# Patient Record
Sex: Female | Born: 1995 | Race: Black or African American | Hispanic: No | Marital: Married | Smoking: Never smoker
Health system: Southern US, Community
[De-identification: ages and names within clinical notes are randomized; demographics above are authoritative.]

## PROBLEM LIST (undated history)

## (undated) DIAGNOSIS — L0291 Cutaneous abscess, unspecified: Secondary | ICD-10-CM

---

## 2015-04-28 ENCOUNTER — Emergency Department (HOSPITAL_BASED_OUTPATIENT_CLINIC_OR_DEPARTMENT_OTHER)
Admission: EM | Admit: 2015-04-28 | Discharge: 2015-04-28 | Discharge status: Against Medical Advice | Payer: Self-pay | Attending: Emergency Medicine | Admitting: Emergency Medicine

## 2015-04-28 ENCOUNTER — Encounter (HOSPITAL_BASED_OUTPATIENT_CLINIC_OR_DEPARTMENT_OTHER): Payer: Self-pay | Admitting: *Deleted

## 2015-04-28 DIAGNOSIS — K088 Other specified disorders of teeth and supporting structures: Secondary | ICD-10-CM | POA: Insufficient documentation

## 2015-04-28 NOTE — ED Notes (Signed)
Pt c/o dental pain x 3 days.

## 2015-04-28 NOTE — ED Notes (Addendum)
Pt appeared to be seeing pt but was not.  Was near hallbed but not seeing pt.

## 2015-04-28 NOTE — ED Notes (Signed)
Pt seen walking down hallway leaving the Ed.  Pts boyfriend stated that "its too long.  We're going to [employer]."

## 2015-04-28 NOTE — ED Notes (Signed)
Patient family stopped this EMT in the hall asking about when they would see the doctor. Notified charge RN and float RN Rod Holler for patient to get an update.

## 2016-09-23 ENCOUNTER — Encounter (HOSPITAL_BASED_OUTPATIENT_CLINIC_OR_DEPARTMENT_OTHER): Payer: Self-pay | Admitting: *Deleted

## 2016-09-23 ENCOUNTER — Emergency Department (HOSPITAL_BASED_OUTPATIENT_CLINIC_OR_DEPARTMENT_OTHER)
Admission: EM | Admit: 2016-09-23 | Discharge: 2016-09-23 | Disposition: A | Discharge status: Home / Self Care | Payer: Medicaid Other | Attending: Emergency Medicine | Admitting: Emergency Medicine

## 2016-09-23 DIAGNOSIS — R42 Dizziness and giddiness: Secondary | ICD-10-CM | POA: Insufficient documentation

## 2016-09-23 LAB — PREGNANCY, URINE: PREG TEST UR: NEGATIVE

## 2016-09-23 LAB — CBG MONITORING, ED: Glucose-Capillary: 111 mg/dL — ABNORMAL HIGH (ref 65–99)

## 2016-09-23 MED ORDER — MECLIZINE HCL 25 MG PO TABS
25.0000 mg | ORAL_TABLET | Freq: Once | ORAL | Status: AC
  Administered 2016-09-23: 25 mg via ORAL
  Filled 2016-09-23: qty 1

## 2016-09-23 MED ORDER — MECLIZINE HCL 25 MG PO TABS: 25.0000 mg | ORAL_TABLET | Freq: Three times a day (TID) | ORAL | 0 refills | Status: DC | PRN

## 2016-09-23 MED ORDER — DEXAMETHASONE 4 MG PO TABS
10.0000 mg | ORAL_TABLET | Freq: Once | ORAL | Status: AC
  Administered 2016-09-23: 10 mg via ORAL
  Filled 2016-09-23: qty 1

## 2016-09-23 NOTE — Discharge Instructions (Signed)
Take the medication provided.  Follow up with your PCP for further eval.  Discuss with your OB about taking out your implanon.

## 2016-09-23 NOTE — ED Notes (Signed)
Reports dizziness and nausea x 4 days.  Reports unknown why.  Denies vision changes, reports headache.  Reports it could be implanon birth control.

## 2016-09-23 NOTE — ED Triage Notes (Signed)
Pt reports 4-7 days of intermittent feelings of dizzyness, "like the room is spinning around." denies any other c/o, amb with quick steady gait smiling in nad

## 2016-09-23 NOTE — ED Provider Notes (Signed)
[facility] DEPT [facility] Provider Note   CSN: [id number] Arrival date & time: 09/23/16  1422     History   Chief Complaint Chief Complaint  Patient presents with  . Dizziness    HPI Monica [name] is a 20 y.o. female.  20 yo F with a cc of light-headedness.  This been going on for about a week. Patient denies any alleviating or worsening factors. Tasted feels like the room is spinning around her. Denies hold symptoms. Denies tinnitus. Denies fevers or chills. Denies increased vaginal bleeding diarrhea or vomiting. Patient is really worried that her Implanon has been bothering her and would like it removed immediately. She also would like her iron level checked.   The history is provided by the patient.  Dizziness  Quality:  Head spinning and room spinning Severity:  Moderate Onset quality:  Gradual Duration:  2 days Timing:  Constant Progression:  Worsening Chronicity:  New Relieved by:  Nothing Worsened by:  Nothing Ineffective treatments:  None tried Associated symptoms: no chest pain, no headaches, no nausea, no palpitations, no shortness of breath and no vomiting     History reviewed. No pertinent past medical history.  There are no active problems to display for this patient.   History reviewed. No pertinent surgical history.  OB History    No data available       Home Medications    Prior to Admission medications   Medication Sig Start Date End Date Taking? Authorizing Provider  meclizine (ANTIVERT) 25 MG tablet Take 1 tablet (25 mg total) by mouth 3 (three) times daily as needed. 09/23/16   Deno Etienne, DO    Family History History reviewed. No pertinent family history.  Social History Social History  Substance Use Topics  . Smoking status: Never Smoker  . Smokeless tobacco: Never Used  . Alcohol use No     Allergies   Patient has no known allergies.   Review of Systems Review of Systems  Constitutional: Negative for chills and fever.    HENT: Negative for congestion and rhinorrhea.   Eyes: Negative for redness and visual disturbance.  Respiratory: Negative for shortness of breath and wheezing.   Cardiovascular: Negative for chest pain and palpitations.  Gastrointestinal: Negative for nausea and vomiting.  Genitourinary: Negative for dysuria and urgency.  Musculoskeletal: Negative for arthralgias and myalgias.  Skin: Negative for pallor and wound.  Neurological: Positive for dizziness. Negative for headaches.     Physical Exam Updated Vital Signs BP 113/76 (BP Location: Left Arm)   Pulse 69   Temp 98.6 F (37 C) (Oral)   Resp 18   LMP 09/09/2016   SpO2 99%   Physical Exam  Constitutional: She is oriented to person, place, and time. She appears well-developed and well-nourished. No distress.  HENT:  Head: Normocephalic and atraumatic.  Eyes: EOM are normal. Pupils are equal, round, and reactive to light.  Neck: Normal range of motion. Neck supple.  Cardiovascular: Normal rate and regular rhythm.  Exam reveals no gallop and no friction rub.   No murmur heard. Pulmonary/Chest: Effort normal. She has no wheezes. She has no rales.  Abdominal: Soft. She exhibits no distension and no mass. There is no tenderness. There is no guarding.  Musculoskeletal: She exhibits no edema or tenderness.  Neurological: She is alert and oriented to person, place, and time. She has normal strength. No cranial nerve deficit or sensory deficit. She displays a negative Romberg sign. Coordination and gait normal. GCS eye subscore is  4. GCS verbal subscore is 5. GCS motor subscore is 6. She displays no Babinski's sign on the right side. She displays no Babinski's sign on the left side.  Reflex Scores:      Tricep reflexes are 2+ on the right side and 2+ on the left side.      Bicep reflexes are 2+ on the right side and 2+ on the left side.      Brachioradialis reflexes are 2+ on the right side and 2+ on the left side.      Patellar  reflexes are 2+ on the right side and 2+ on the left side.      Achilles reflexes are 2+ on the right side and 2+ on the left side. Skin: Skin is warm and dry. She is not diaphoretic.  Psychiatric: She has a normal mood and affect. Her behavior is normal.  Nursing note and vitals reviewed.    ED Treatments / Results  Labs (all labs ordered are listed, but only abnormal results are displayed) Labs Reviewed  CBG MONITORING, ED - Abnormal; Notable for the following:       Result Value   Glucose-Capillary 111 (*)    All other components within normal limits  PREGNANCY, URINE    EKG  EKG Interpretation  Date/Time:  Thursday September 23 2016 14:55:55 EST Ventricular Rate:  70 PR Interval:    QRS Duration: 90 QT Interval:  383 QTC Calculation: 414 R Axis:   82 Text Interpretation:  Sinus rhythm Borderline T abnormalities, anterior leads No old tracing to compare Confirmed by Summer Parthasarathy MD, DANIEL [phone]) on 09/23/2016 3:03:43 PM       Radiology No results found.  Procedures Procedures (including critical care time)  Medications Ordered in ED Medications  meclizine (ANTIVERT) tablet 25 mg (25 mg Oral Given 09/23/16 1501)  dexamethasone (DECADRON) tablet 10 mg (10 mg Oral Given 09/23/16 1501)     Initial Impression / Assessment and Plan / ED Course  I have reviewed the triage vital signs and the nursing notes.  Pertinent labs & imaging results that were available during my care of the patient were reviewed by me and considered in my medical decision making (see chart for details).  Clinical Course     20 yo F with a cc of vertigo.  ECG, poc glucose and pregnancy test unremarkable.  PCP follow up.   3:06 PM:  I have discussed the diagnosis/risks/treatment options with the patient and family and believe the pt to be eligible for discharge home to follow-up with PCP. We also discussed returning to the ED immediately if new or worsening sx occur. We discussed the sx which are most  concerning (e.g., sudden worsening pain, fever, inability to tolerate by mouth) that necessitate immediate return. Medications administered to the patient during their visit and any new prescriptions provided to the patient are listed below.  Medications given during this visit Medications  meclizine (ANTIVERT) tablet 25 mg (25 mg Oral Given 09/23/16 1501)  dexamethasone (DECADRON) tablet 10 mg (10 mg Oral Given 09/23/16 1501)     The patient appears reasonably screen and/or stabilized for discharge and I doubt any other medical condition or other [facility] requiring further screening, evaluation, or treatment in the ED at this time prior to discharge.    Final Clinical Impressions(s) / ED Diagnoses   Final diagnoses:  Dizziness    New Prescriptions New Prescriptions   MECLIZINE (ANTIVERT) 25 MG TABLET    Take 1 tablet (25 mg  total) by mouth 3 (three) times daily as needed.     Deno Etienne, DO 09/23/16 1506

## 2017-04-22 ENCOUNTER — Emergency Department (HOSPITAL_BASED_OUTPATIENT_CLINIC_OR_DEPARTMENT_OTHER)
Admission: EM | Admit: 2017-04-22 | Discharge: 2017-04-22 | Disposition: A | Discharge status: Home / Self Care | Payer: Medicaid Other | Attending: Emergency Medicine | Admitting: Emergency Medicine

## 2017-04-22 ENCOUNTER — Encounter (HOSPITAL_BASED_OUTPATIENT_CLINIC_OR_DEPARTMENT_OTHER): Payer: Self-pay | Admitting: Emergency Medicine

## 2017-04-22 DIAGNOSIS — T450X1A Poisoning by antiallergic and antiemetic drugs, accidental (unintentional), initial encounter: Secondary | ICD-10-CM | POA: Diagnosis not present

## 2017-04-22 DIAGNOSIS — F419 Anxiety disorder, unspecified: Secondary | ICD-10-CM | POA: Diagnosis present

## 2017-04-22 MED ORDER — LORAZEPAM 1 MG PO TABS
0.5000 mg | ORAL_TABLET | Freq: Once | ORAL | Status: AC
  Administered 2017-04-22: 0.5 mg via ORAL
  Filled 2017-04-22: qty 1

## 2017-04-22 NOTE — ED Provider Notes (Signed)
[facility] DEPT [facility] Provider Note   CSN: [id number] Arrival date & time: 04/22/17  1123     History   Chief Complaint Chief Complaint  Patient presents with  . Medication Reaction    HPI Monica [name] is a 21 y.o. female.  21 yo F with a chief complaints of anxiety. Going on for the past couple days. Feels like she is shaking all over. States that she started taking a weight gain supplement Ciproheptidine.  After which started having the onset of the symptoms. She has tried drinking fluids at home and sleeping all day that has not improved it. Was worse after she had a couple coffee.   The history is provided by the patient.  Illness  This is a new problem. The current episode started 2 days ago. The problem occurs constantly. The problem has not changed since onset.Pertinent negatives include no chest pain, no headaches and no shortness of breath. Nothing aggravates the symptoms. Nothing relieves the symptoms. She has tried nothing for the symptoms. The treatment provided no relief.    History reviewed. No pertinent past medical history.  There are no active problems to display for this patient.   History reviewed. No pertinent surgical history.  OB History    No data available       Home Medications    Prior to Admission medications   Medication Sig Start Date End Date Taking? Authorizing Provider  meclizine (ANTIVERT) 25 MG tablet Take 1 tablet (25 mg total) by mouth 3 (three) times daily as needed. 09/23/16   Deno Etienne, DO    Family History History reviewed. No pertinent family history.  Social History Social History  Substance Use Topics  . Smoking status: Never Smoker  . Smokeless tobacco: Never Used  . Alcohol use No     Allergies   Patient has no known allergies.   Review of Systems Review of Systems  Constitutional: Negative for chills and fever.  HENT: Negative for congestion and rhinorrhea.   Eyes: Negative for redness and visual  disturbance.  Respiratory: Negative for shortness of breath and wheezing.   Cardiovascular: Negative for chest pain and palpitations.  Gastrointestinal: Negative for nausea and vomiting.  Genitourinary: Negative for dysuria and urgency.  Musculoskeletal: Negative for arthralgias and myalgias.  Skin: Negative for pallor and wound.  Neurological: Negative for dizziness and headaches.     Physical Exam Updated Vital Signs BP 121/80 (BP Location: Left Arm)   Pulse 70   Temp 98.4 F (36.9 C) (Oral)   Resp 16   Ht 5\' 6"  (1.676 m)   Wt 48.1 kg (106 lb)   LMP 04/15/2017 (Approximate)   SpO2 100%   BMI 17.11 kg/m   Physical Exam  Constitutional: She is oriented to person, place, and time. She appears well-developed and well-nourished. No distress.  HENT:  Head: Normocephalic and atraumatic.  Eyes: EOM are normal. Pupils are equal, round, and reactive to light.  Neck: Normal range of motion. Neck supple.  Cardiovascular: Normal rate and regular rhythm.  Exam reveals no gallop and no friction rub.   No murmur heard. Pulmonary/Chest: Effort normal. She has no wheezes. She has no rales.  Abdominal: Soft. She exhibits no distension. There is no tenderness.  Musculoskeletal: She exhibits no edema or tenderness.  Neurological: She is alert and oriented to person, place, and time.  Skin: Skin is warm and dry. She is not diaphoretic.  Psychiatric: She has a normal mood and affect. Her behavior is normal.  Nursing note and vitals reviewed.    ED Treatments / Results  Labs (all labs ordered are listed, but only abnormal results are displayed) Labs Reviewed - No data to display  EKG  EKG Interpretation None       Radiology No results found.  Procedures Procedures (including critical care time)  Medications Ordered in ED Medications  LORazepam (ATIVAN) tablet 0.5 mg (not administered)     Initial Impression / Assessment and Plan / ED Course  I have reviewed the triage  vital signs and the nursing notes.  Pertinent labs & imaging results that were available during my care of the patient were reviewed by me and considered in my medical decision making (see chart for details).     21 yo F with a chief complaints of a antihistamine overdose. This was in an effort to gain weight. Patient is not tachycardic she is not dehydrated. Alert and oriented. Well-appearing and nontoxic. I feel no further workup is required. Discussed with the patient she needs to stop taking her weight gain supplement.  12:13 PM:  I have discussed the diagnosis/risks/treatment options with the patient and family and believe the pt to be eligible for discharge home to follow-up with PCP. We also discussed returning to the ED immediately if new or worsening sx occur. We discussed the sx which are most concerning (e.g., sudden worsening pain, fever, inability to tolerate by mouth) that necessitate immediate return. Medications administered to the patient during their visit and any new prescriptions provided to the patient are listed below.  Medications given during this visit Medications  LORazepam (ATIVAN) tablet 0.5 mg (not administered)     The patient appears reasonably screen and/or stabilized for discharge and I doubt any other medical condition or other [facility] requiring further screening, evaluation, or treatment in the ED at this time prior to discharge.    Final Clinical Impressions(s) / ED Diagnoses   Final diagnoses:  Antihistamine poisoning, accidental or unintentional, initial encounter    New Prescriptions New Prescriptions   No medications on file     Deno Etienne, DO 04/22/17 1213

## 2017-04-22 NOTE — Discharge Instructions (Signed)
Stop taking this medication. You can get help on the phone at any time by calling the [facility]. [phone]

## 2017-04-22 NOTE — ED Triage Notes (Signed)
Patient reports currently taking apetamine she ordered from the internet to help her gain weight.  States she started taking it Monday and yesterday began feeling like she was going to pass out after drinking a coffee drink from [employer].  States "I don't feel like myself. I feel out of my head".  Patient alert and oriented to triage in NAD.

## 2018-11-23 ENCOUNTER — Other Ambulatory Visit: Payer: Self-pay

## 2018-11-23 ENCOUNTER — Encounter (HOSPITAL_BASED_OUTPATIENT_CLINIC_OR_DEPARTMENT_OTHER): Payer: Self-pay | Admitting: Emergency Medicine

## 2018-11-23 ENCOUNTER — Emergency Department (HOSPITAL_BASED_OUTPATIENT_CLINIC_OR_DEPARTMENT_OTHER)
Admission: EM | Admit: 2018-11-23 | Discharge: 2018-11-23 | Disposition: A | Discharge status: Home / Self Care | Payer: Self-pay | Attending: Emergency Medicine | Admitting: Emergency Medicine

## 2018-11-23 DIAGNOSIS — L02411 Cutaneous abscess of right axilla: Secondary | ICD-10-CM | POA: Insufficient documentation

## 2018-11-23 DIAGNOSIS — L02419 Cutaneous abscess of limb, unspecified: Secondary | ICD-10-CM

## 2018-11-23 MED ORDER — LIDOCAINE-EPINEPHRINE (PF) 2 %-1:200000 IJ SOLN
10.0000 mL | Freq: Once | INTRAMUSCULAR | Status: AC
  Administered 2018-11-23: 10 mL
  Filled 2018-11-23 (×2): qty 10

## 2018-11-23 NOTE — ED Provider Notes (Signed)
[facility] EMERGENCY DEPARTMENT Provider Note   CSN: [id number] Arrival date & time: 11/23/18  1021     History   Chief Complaint Chief Complaint  Patient presents with  . Abscess    HPI Monica [name] is a 23 y.o. female.  Patient presents to the emergency department with right axillary abscess.  She has had similar abscesses in the past but not for couple of years.  Symptoms started about 2 weeks ago when she first noticed a small bump.  Over the past several days it has become more painful and become bigger.  No active drainage.  No fevers, nausea or vomiting.  No treatments at home.  Course is worsening.  Nothing make symptoms better.     History reviewed. No pertinent past medical history.  There are no active problems to display for this patient.   History reviewed. No pertinent surgical history.   OB History   No obstetric history on file.      Home Medications    Prior to Admission medications   Medication Sig Start Date End Date Taking? Authorizing Provider  meclizine (ANTIVERT) 25 MG tablet Take 1 tablet (25 mg total) by mouth 3 (three) times daily as needed. 09/23/16   Deno Etienne, DO    Family History History reviewed. No pertinent family history.  Social History Social History   Tobacco Use  . Smoking status: Never Smoker  . Smokeless tobacco: Never Used  Substance Use Topics  . Alcohol use: No  . Drug use: Not on file     Allergies   Patient has no known allergies.   Review of Systems Review of Systems  Constitutional: Negative for fever.  Gastrointestinal: Negative for nausea and vomiting.  Skin: Negative for color change.       Positive for abscess.  Hematological: Negative for adenopathy.     Physical Exam Updated Vital Signs BP 115/69 (BP Location: Right Arm)   Pulse 78   Temp 98.6 F (37 C) (Oral)   Resp 18   Ht 5\' 5"  (1.651 m)   Wt 54.4 kg   LMP 11/23/2018   SpO2 99%   BMI 19.97 kg/m   Physical  Exam Vitals signs and nursing note reviewed.  Constitutional:      Appearance: She is well-developed.  HENT:     Head: Normocephalic and atraumatic.  Eyes:     Conjunctiva/sclera: Conjunctivae normal.  Neck:     Musculoskeletal: Normal range of motion and neck supple.  Pulmonary:     Effort: No respiratory distress.  Skin:    General: Skin is warm and dry.     Comments: 2 cm area of induration with associated fluctuance in the right axilla consistent with abscess.  No associated cellulitis.  Neurological:     Mental Status: She is alert.      ED Treatments / Results  Labs (all labs ordered are listed, but only abnormal results are displayed) Labs Reviewed - No data to display  EKG None  Radiology No results found.  Procedures .Marland KitchenIncision and Drainage Date/Time: 11/23/2018 11:03 AM Performed by: Carlisle Cater, PA-C Authorized by: Carlisle Cater, PA-C   Consent:    Consent obtained:  Verbal   Consent given by:  Patient   Risks discussed:  Bleeding and pain   Alternatives discussed:  No treatment Location:    Type:  Abscess   Size:  2cm   Location: Right axilla. Pre-procedure details:    Skin preparation:  Betadine Anesthesia (see  MAR for exact dosages):    Anesthesia method:  Local infiltration   Local anesthetic:  Lidocaine 2% WITH epi Procedure type:    Complexity:  Simple Procedure details:    Incision types:  Stab incision   Incision depth:  Dermal   Scalpel blade:  11   Wound management:  Probed and deloculated   Drainage:  Purulent   Drainage amount:  Moderate   Wound treatment:  Wound left open   Packing materials:  None Post-procedure details:    Patient tolerance of procedure:  Tolerated well, no immediate complications   (including critical care time)  Medications Ordered in ED Medications  lidocaine-EPINEPHrine (XYLOCAINE W/EPI) 2 %-1:200000 (PF) injection 10 mL (has no administration in time range)     Initial Impression / Assessment  and Plan / ED Course  I have reviewed the triage vital signs and the nursing notes.  Pertinent labs & imaging results that were available during my care of the patient were reviewed by me and considered in my medical decision making (see chart for details).     Patient seen and examined. Medications ordered.  Discussed risks and benefits of incision and drainage with patient.  She agrees to proceed.  Vital signs reviewed and are as follows: BP 115/69 (BP Location: Right Arm)   Pulse 78   Temp 98.6 F (37 C) (Oral)   Resp 18   Ht 5\' 5"  (1.651 m)   Wt 54.4 kg   LMP 11/23/2018   SpO2 99%   BMI 19.97 kg/m   11:04 AM The patient was urged to return to the Emergency Department urgently with worsening pain, swelling, expanding erythema especially if it streaks away from the affected area, fever, or if they have any other concerns.   Encouraged warm compresses 3 times a day for 15 minutes over the next several days.  The patient was urged to return to the Emergency Department or go to their PCP in 48 hours for wound recheck if the area is not significantly improved.  The patient verbalized understanding and stated agreement with this plan.     Final Clinical Impressions(s) / ED Diagnoses   Final diagnoses:  Axillary abscess   Patient with right axillary abscess.  No systemic symptoms or cellulitis.  I&D performed without complication.  No indications for antibiotics.  ED Discharge Orders    None       Carlisle Cater, Hershal Coria 11/23/18 1105    Long, [facility], MD 11/23/18 1149

## 2018-11-23 NOTE — ED Triage Notes (Signed)
C/o abscess to right axilla x 2 weeks.  Denies drainage at present.

## 2018-11-23 NOTE — ED Notes (Signed)
Pt verbalized understanding of dc instructions.

## 2018-11-23 NOTE — Discharge Instructions (Signed)
Please read and follow all provided instructions.  Your diagnoses today include:  1. Axillary abscess     Tests performed today include:  Vital signs. See below for your results today.   Medications prescribed:   None  Take any prescribed medications only as directed.   Home care instructions:   Follow any educational materials contained in this packet  Follow-up instructions: Return to the Emergency Department in 48 hours for a recheck if your symptoms are not significantly improved.  Please follow-up with your primary care provider in the next 1 week for further evaluation of your symptoms as needed.   Return instructions:  Return to the Emergency Department if you have:  Fever  Worsening symptoms  Worsening pain  Worsening swelling  Redness of the skin that moves away from the affected area, especially if it streaks away from the affected area   Any other emergent concerns  Your vital signs today were: BP 115/69 (BP Location: Right Arm)    Pulse 78    Temp 98.6 F (37 C) (Oral)    Resp 18    Ht 5\' 5"  (1.651 m)    Wt 54.4 kg    LMP 11/23/2018    SpO2 99%    BMI 19.97 kg/m  If your blood pressure (BP) was elevated above 135/85 this visit, please have this repeated by your doctor within one month. --------------

## 2019-04-18 ENCOUNTER — Other Ambulatory Visit: Payer: Self-pay

## 2019-04-18 ENCOUNTER — Encounter (HOSPITAL_BASED_OUTPATIENT_CLINIC_OR_DEPARTMENT_OTHER): Payer: Self-pay | Admitting: *Deleted

## 2019-04-18 ENCOUNTER — Emergency Department (HOSPITAL_BASED_OUTPATIENT_CLINIC_OR_DEPARTMENT_OTHER): Payer: Self-pay

## 2019-04-18 ENCOUNTER — Emergency Department (HOSPITAL_BASED_OUTPATIENT_CLINIC_OR_DEPARTMENT_OTHER)
Admission: EM | Admit: 2019-04-18 | Discharge: 2019-04-18 | Disposition: A | Discharge status: Home / Self Care | Payer: Self-pay | Attending: Emergency Medicine | Admitting: Emergency Medicine

## 2019-04-18 DIAGNOSIS — R0789 Other chest pain: Secondary | ICD-10-CM | POA: Insufficient documentation

## 2019-04-18 LAB — URINALYSIS, ROUTINE W REFLEX MICROSCOPIC
Bilirubin Urine: NEGATIVE
Glucose, UA: NEGATIVE mg/dL
Hgb urine dipstick: NEGATIVE
Ketones, ur: NEGATIVE mg/dL
Leukocytes,Ua: NEGATIVE
Nitrite: NEGATIVE
Protein, ur: NEGATIVE mg/dL
Specific Gravity, Urine: 1.015 (ref 1.005–1.030)
pH: 7.5 (ref 5.0–8.0)

## 2019-04-18 LAB — PREGNANCY, URINE: Preg Test, Ur: NEGATIVE

## 2019-04-18 IMAGING — CR CHEST - 2 VIEW
2 series · 2 of 2 positions shown · non-contrast
Comparison: None.

CLINICAL DATA: Chest pain

EXAM:
CHEST - 2 VIEW

[w chest pa]
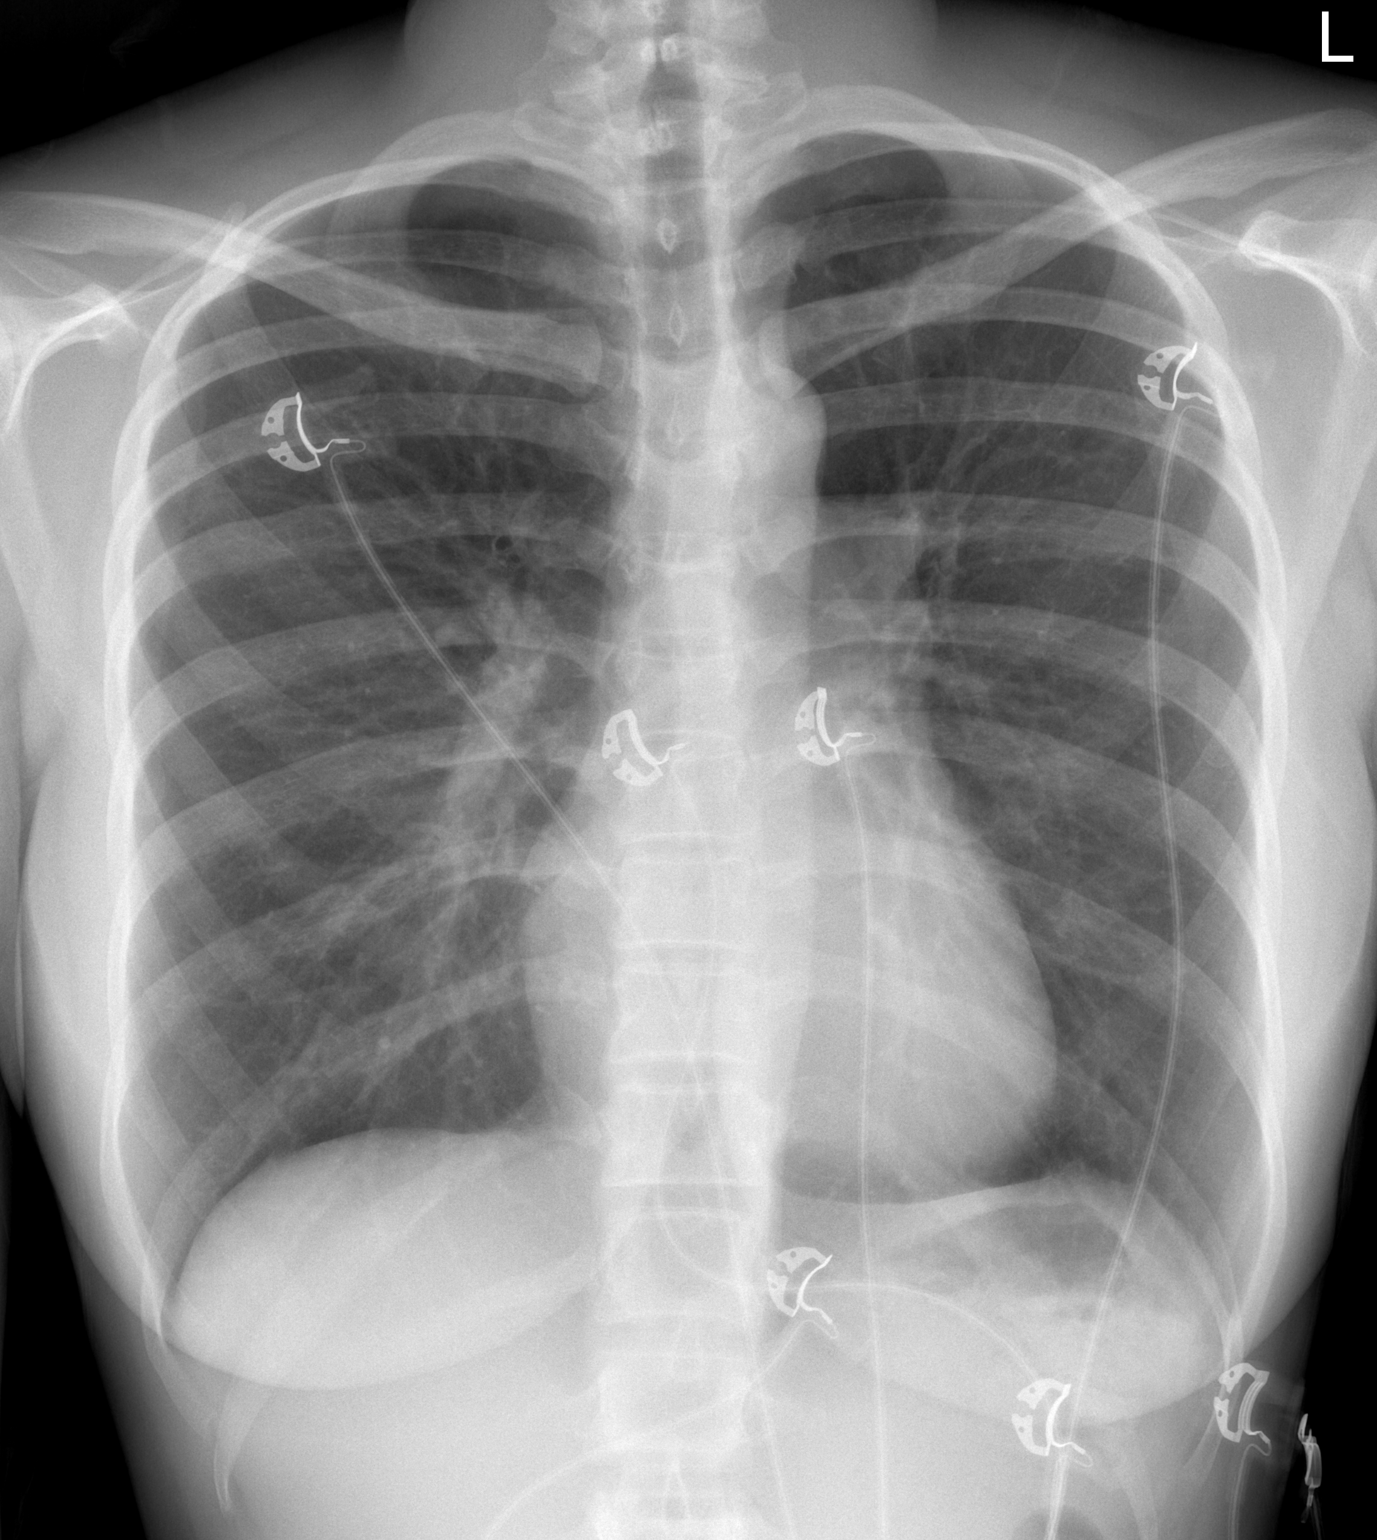

[w chest lat]
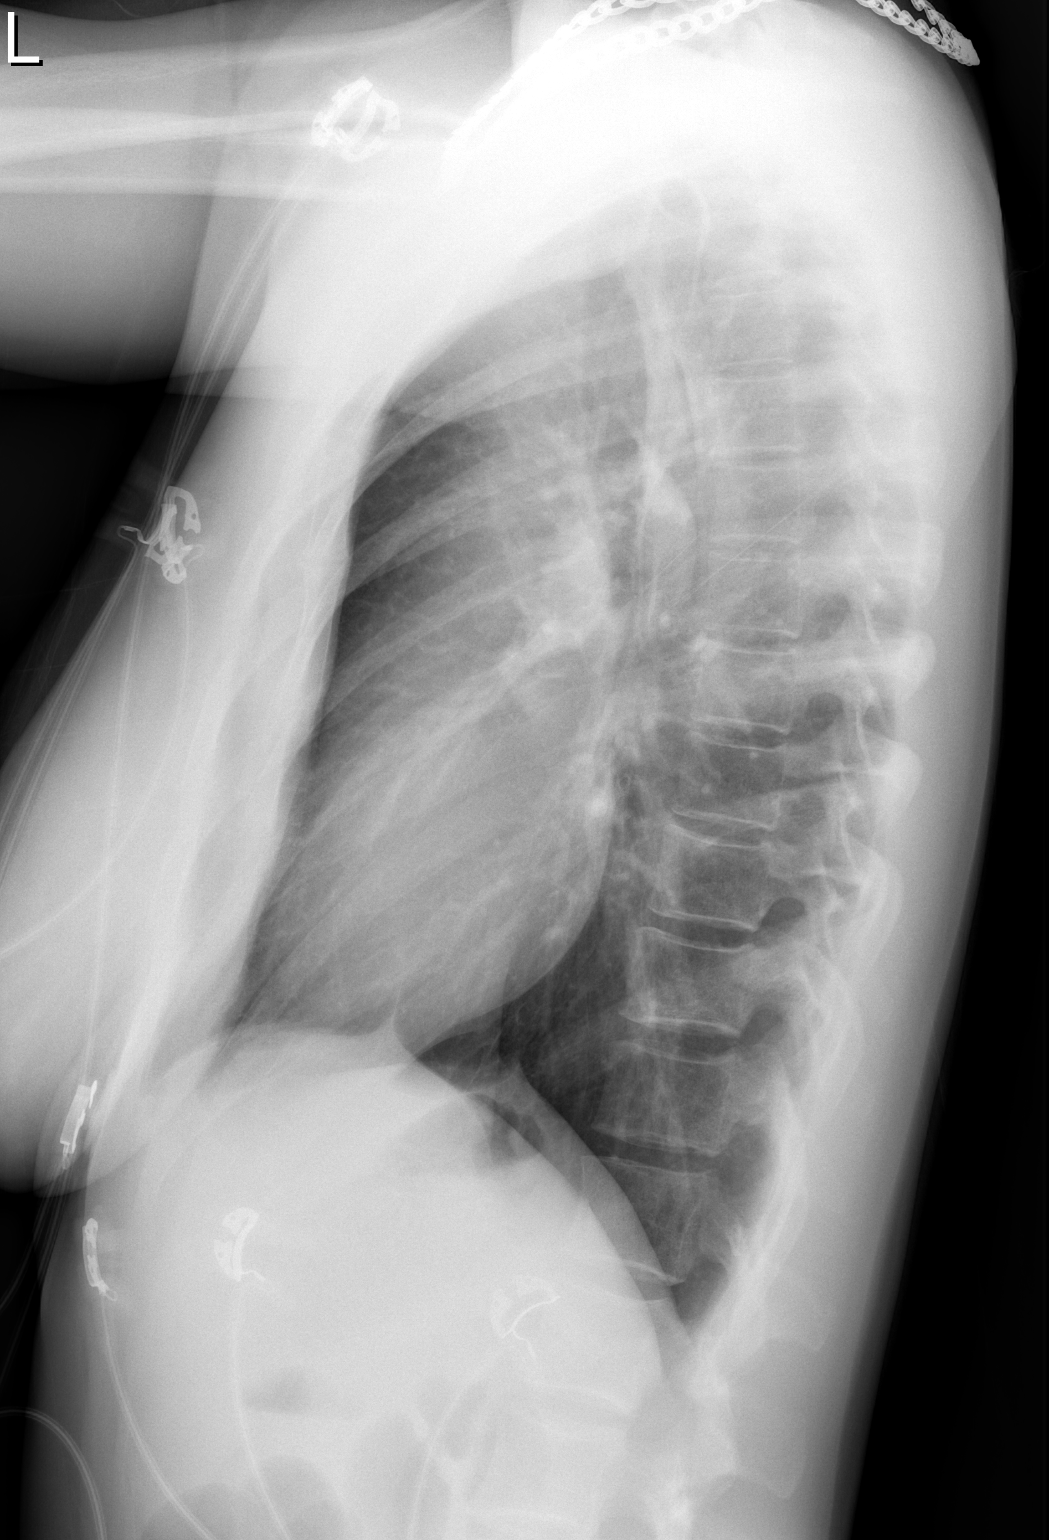

[2 of 2 positions shown; findings below may reference images not displayed]

FINDINGS: Heart and mediastinal contours are within normal limits. No focal
opacities or effusions. No acute bony abnormality.
IMPRESSION: No active cardiopulmonary disease.

## 2019-04-18 MED ORDER — LORAZEPAM 1 MG PO TABS
1.0000 mg | ORAL_TABLET | Freq: Once | ORAL | Status: AC
  Administered 2019-04-18: 23:00:00 1 mg via ORAL
  Filled 2019-04-18: qty 1

## 2019-04-18 NOTE — ED Provider Notes (Signed)
[facility] EMERGENCY DEPARTMENT Provider Note   CSN: [id number] Arrival date & time: 04/18/19  2137     History   Chief Complaint Chief Complaint  Patient presents with  . Chest Pain    HPI [name] is a 23 y.o. female.  She is complaining of 4 days of right-sided chest pain that is been on and off.  Sometimes it makes her feel short of breath.  She was concerned she might have Covid so she went and got off a Covid test at [facility] and does not know the results yet.  She googled her symptoms and got more nervous about it and decided to come the emergency room to make sure she was okay.  No cough no fever no nausea no vomiting no lack of smell.  Last menstrual period was 2 weeks ago.  Not on any OCPs.     The history is provided by the patient.  Chest Pain Pain location:  R chest Pain quality: sharp   Pain radiates to:  R shoulder Pain severity:  Moderate Onset quality:  Gradual Timing:  Intermittent Progression:  Unchanged Chronicity:  New Relieved by:  None tried Worsened by:  Nothing Ineffective treatments:  None tried Associated symptoms: shortness of breath   Associated symptoms: no abdominal pain, no back pain, no cough, no fever, no headache, no nausea, no numbness, no vomiting and no weakness   Risk factors: no birth control, no coronary artery disease, no high cholesterol, no hypertension and no prior DVT/PE     History reviewed. No pertinent past medical history.  There are no active problems to display for this patient.   History reviewed. No pertinent surgical history.   OB History   No obstetric history on file.      Home Medications    Prior to Admission medications   Medication Sig Start Date End Date Taking? Authorizing Provider  meclizine (ANTIVERT) 25 MG tablet Take 1 tablet (25 mg total) by mouth 3 (three) times daily as needed. 09/23/16   Deno Etienne, DO    Family History History reviewed. No pertinent family  history.  Social History Social History   Tobacco Use  . Smoking status: Never Smoker  . Smokeless tobacco: Never Used  Substance Use Topics  . Alcohol use: No  . Drug use: Not on file     Allergies   Patient has no known allergies.   Review of Systems Review of Systems  Constitutional: Negative for fever.  HENT: Negative for sore throat.   Eyes: Negative for visual disturbance.  Respiratory: Positive for shortness of breath. Negative for cough.   Cardiovascular: Positive for chest pain.  Gastrointestinal: Negative for abdominal pain, nausea and vomiting.  Genitourinary: Negative for dysuria.  Musculoskeletal: Negative for back pain.  Skin: Negative for rash.  Neurological: Negative for weakness, numbness and headaches.     Physical Exam Updated Vital Signs BP (!) 124/93   Pulse 98   Resp 16   Ht 5\' 6"  (1.676 m)   Wt 54.4 kg   LMP 04/11/2019   SpO2 100%   BMI 19.37 kg/m   Physical Exam Vitals signs and nursing note reviewed.  Constitutional:      General: She is not in acute distress.    Appearance: She is well-developed.  HENT:     Head: Normocephalic and atraumatic.  Eyes:     Conjunctiva/sclera: Conjunctivae normal.  Neck:     Musculoskeletal: Neck supple.  Cardiovascular:  Rate and Rhythm: Normal rate and regular rhythm.     Heart sounds: Normal heart sounds. No murmur.  Pulmonary:     Effort: Pulmonary effort is normal. No respiratory distress.     Breath sounds: Normal breath sounds.  Abdominal:     Palpations: Abdomen is soft.     Tenderness: There is no abdominal tenderness.  Musculoskeletal: Normal range of motion.     Right lower leg: She exhibits no tenderness. No edema.     Left lower leg: She exhibits no tenderness. No edema.  Skin:    General: Skin is warm and dry.     Capillary Refill: Capillary refill takes less than 2 seconds.  Neurological:     General: No focal deficit present.     Mental Status: She is alert.      ED  Treatments / Results  Labs (all labs ordered are listed, but only abnormal results are displayed) Labs Reviewed  URINALYSIS, [place], URINE    EKG None  Radiology No results found.  Procedures Procedures (including critical care time)  Medications Ordered in ED Medications - No data to display   Initial Impression / Assessment and Plan / ED Course  I have reviewed the triage vital signs and the nursing notes.  Pertinent labs & imaging results that were available during my care of the patient were reviewed by me and considered in my medical decision making (see chart for details).  Clinical Course as of Apr 18 834  Wed Apr 17, 5210  3561 23 year old female with no significant past medical history here with 4 days of intermittent sharp right-sided chest pain.  Differential includes musculoskeletal, GERD, anxiety, ACS, pneumothorax, PE.  She is PERC negative here.  Pulse ox 100%.  Benign physical exam.  She is getting a chest x-ray EKG.  Will check pregnancy test.   [MB]  2228 Patient's chest x-ray was unremarkable.  Pregnancy test negative EKG unremarkable.  She is asking if she can have the medication for her anxiety.  I told her we would not prescribe her anything that would give her a tablet here she is not driving.   [MB]    Clinical Course User Index [MB] Hayden Rasmussen, MD         Final Clinical Impressions(s) / ED Diagnoses   Final diagnoses:  Atypical chest pain    ED Discharge Orders    None       Hayden Rasmussen, MD 04/19/19 [phone]

## 2019-04-18 NOTE — Discharge Instructions (Addendum)
You were seen in the emergency department for right-sided chest pain and feeling short of breath.  You had a chest x-ray and EKG that did not show an obvious cause of your symptoms.  Is possible this is related to anxiety and panic attack and we are giving you a list of some resources for some counseling.  Please return if any worsening symptoms.

## 2019-04-18 NOTE — ED Triage Notes (Signed)
Pt c/o right sided chest pain x 4 days

## 2019-05-30 ENCOUNTER — Encounter (HOSPITAL_BASED_OUTPATIENT_CLINIC_OR_DEPARTMENT_OTHER): Payer: Self-pay | Admitting: Emergency Medicine

## 2019-05-30 ENCOUNTER — Other Ambulatory Visit: Payer: Self-pay

## 2019-05-30 ENCOUNTER — Emergency Department (HOSPITAL_BASED_OUTPATIENT_CLINIC_OR_DEPARTMENT_OTHER)
Admission: EM | Admit: 2019-05-30 | Discharge: 2019-05-30 | Disposition: A | Discharge status: Home / Self Care | Payer: Self-pay | Attending: Emergency Medicine | Admitting: Emergency Medicine

## 2019-05-30 DIAGNOSIS — L739 Follicular disorder, unspecified: Secondary | ICD-10-CM | POA: Insufficient documentation

## 2019-05-30 DIAGNOSIS — Z79899 Other long term (current) drug therapy: Secondary | ICD-10-CM | POA: Insufficient documentation

## 2019-05-30 HISTORY — DX: Cutaneous abscess, unspecified: L02.91

## 2019-05-30 NOTE — Discharge Instructions (Signed)
Continue warm compresses often as possible, continue express and drainage as discussed.  Please use Tylenol Motrin for pain.

## 2019-05-30 NOTE — ED Triage Notes (Signed)
Abscess to right axilla area,  Has been draining small amt, x 3 days

## 2019-05-30 NOTE — ED Provider Notes (Signed)
[name] Provider Note   CSN: [id number] Arrival date & time: 05/30/19  1017     History   Chief Complaint Chief Complaint  Patient presents with  . Abscess    HPI [name] is a 23 y.o. female.     The history is provided by the patient.  Abscess Location:  Shoulder/arm Shoulder/arm abscess location:  R axilla Abscess quality: draining, induration and painful   Abscess quality: no redness and no warmth   Red streaking: no   Progression:  Improving Pain details:    Quality:  Dull   Severity:  Mild   Duration:  5 days   Progression:  Waxing and waning Chronicity:  Recurrent Context: not injected drug use and not skin injury   Relieved by:  Draining/squeezing and warm compresses Worsened by:  Nothing Associated symptoms: no fever   Risk factors: prior abscess     No past medical history on file.  There are no active problems to display for this patient.   No past surgical history on file.   OB History   No obstetric history on file.      Home Medications    Prior to Admission medications   Medication Sig Start Date End Date Taking? Authorizing Provider  meclizine (ANTIVERT) 25 MG tablet Take 1 tablet (25 mg total) by mouth 3 (three) times daily as needed. 09/23/16   Deno Etienne, DO    Family History No family history on file.  Social History Social History   Tobacco Use  . Smoking status: Never Smoker  . Smokeless tobacco: Never Used  Substance Use Topics  . Alcohol use: No  . Drug use: Not on file     Allergies   Patient has no known allergies.   Review of Systems Review of Systems  Constitutional: Negative for chills and fever.  Musculoskeletal: Negative for joint swelling.  Skin: Positive for wound. Negative for color change and pallor.     Physical Exam Updated Vital Signs BP 112/75 (BP Location: Left Arm)   Pulse 79   Temp 99.2 F (37.3 C) (Oral)   Resp 18   SpO2 100%   Physical  Exam Constitutional:      General: She is not in acute distress.    Appearance: She is not ill-appearing.  Cardiovascular:     Pulses: Normal pulses.  Skin:    Capillary Refill: Capillary refill takes less than 2 seconds.     Comments: 2 areas of hardened old likely boils in the right axilla, one area that appears to have had some drainage but no obvious fluctuance or redness or infectious signs within the right axilla  Neurological:     Mental Status: She is alert.      ED Treatments / Results  Labs (all labs ordered are listed, but only abnormal results are displayed) Labs Reviewed - No data to display  EKG None  Radiology No results found.  Procedures Procedures (including critical care time)  Medications Ordered in ED Medications - No data to display   Initial Impression / Assessment and Plan / ED Course  I have reviewed the triage vital signs and the nursing notes.  Pertinent labs & imaging results that were available during my care of the patient were reviewed by me and considered in my medical decision making (see chart for details).        Monica [name] is a 23 year old here for evaluation for boils in her right axilla.  Mostly these are chronic.  They are recurrent.  2 of the 3 boils are hard and indurated and there is no fluctuance.  She states that these are old ones.  One boil appears to have some drainage to it.  No need for any further I&D.  No signs to suggest acute infectious process.  Recommend continued warm compresses at home and discharged from the ED in good condition.  Recommend Tylenol Motrin.  This chart was dictated using voice recognition software.  Despite best efforts to proofread,  errors can occur which can change the documentation meaning.    Final Clinical Impressions(s) / ED Diagnoses   Final diagnoses:  Folliculitis    ED Discharge Orders    None       Lennice Sites, DO 05/30/19 1041

## 2021-09-08 ENCOUNTER — Other Ambulatory Visit: Payer: Self-pay

## 2021-09-08 DIAGNOSIS — N6310 Unspecified lump in the right breast, unspecified quadrant: Secondary | ICD-10-CM

## 2021-09-09 ENCOUNTER — Other Ambulatory Visit: Payer: Self-pay

## 2021-09-09 ENCOUNTER — Other Ambulatory Visit: Payer: Self-pay | Admitting: *Deleted

## 2021-09-09 DIAGNOSIS — N6310 Unspecified lump in the right breast, unspecified quadrant: Secondary | ICD-10-CM

## 2021-09-14 ENCOUNTER — Other Ambulatory Visit: Payer: Self-pay | Admitting: Physician Assistant

## 2021-09-14 DIAGNOSIS — N6310 Unspecified lump in the right breast, unspecified quadrant: Secondary | ICD-10-CM

## 2021-09-18 ENCOUNTER — Encounter (HOSPITAL_BASED_OUTPATIENT_CLINIC_OR_DEPARTMENT_OTHER): Payer: Self-pay | Admitting: *Deleted

## 2021-09-18 ENCOUNTER — Emergency Department (HOSPITAL_BASED_OUTPATIENT_CLINIC_OR_DEPARTMENT_OTHER)
Admission: EM | Admit: 2021-09-18 | Discharge: 2021-09-18 | Disposition: A | Discharge status: Home / Self Care | Payer: No Typology Code available for payment source | Attending: Emergency Medicine | Admitting: Emergency Medicine

## 2021-09-18 ENCOUNTER — Other Ambulatory Visit: Payer: Self-pay

## 2021-09-18 DIAGNOSIS — Y92410 Unspecified street and highway as the place of occurrence of the external cause: Secondary | ICD-10-CM | POA: Insufficient documentation

## 2021-09-18 DIAGNOSIS — R109 Unspecified abdominal pain: Secondary | ICD-10-CM | POA: Diagnosis present

## 2021-09-18 MED ORDER — METHOCARBAMOL 500 MG PO TABS: 500.0000 mg | ORAL_TABLET | Freq: Two times a day (BID) | ORAL | 0 refills | Status: DC
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
## 2021-10-02 ENCOUNTER — Other Ambulatory Visit: Payer: Self-pay | Admitting: Physician Assistant

## 2021-10-02 ENCOUNTER — Ambulatory Visit
Admission: RE | Admit: 2021-10-02 | Discharge: 2021-10-02 | Disposition: A | Discharge status: Home / Self Care | Payer: Medicaid Other | Source: Ambulatory Visit | Attending: Physician Assistant | Admitting: Physician Assistant

## 2021-10-02 DIAGNOSIS — N6459 Other signs and symptoms in breast: Secondary | ICD-10-CM

## 2021-10-02 DIAGNOSIS — N6310 Unspecified lump in the right breast, unspecified quadrant: Secondary | ICD-10-CM

## 2021-10-02 DIAGNOSIS — R599 Enlarged lymph nodes, unspecified: Secondary | ICD-10-CM

## 2021-10-02 IMAGING — US US BREAST*R* LIMITED INC AXILLA
2 series · 12 of 25 positions shown · non-contrast
Comparison: None.

CLINICAL DATA: [age] with a large palpable lump and
worsening deformity of the right breast and right nipple.

EXAM:
ULTRASOUND RIGHT BREAST LIMITED; DIGITAL DIAGNOSTIC BILATERAL
MAMMOGRAM WITH TOMOSYNTHESIS AND CAD
TECHNIQUE: Targeted ultrasound examination of the right breast was performed;
Bilateral digital diagnostic mammography and breast tomosynthesis
was performed. The images were evaluated with computer-aided
detection.

[Series 1: us breast*right* limited inc axilla · 0.07mm/px · 1 of 3 slices shown (1 of 2)]
[im 2/3]
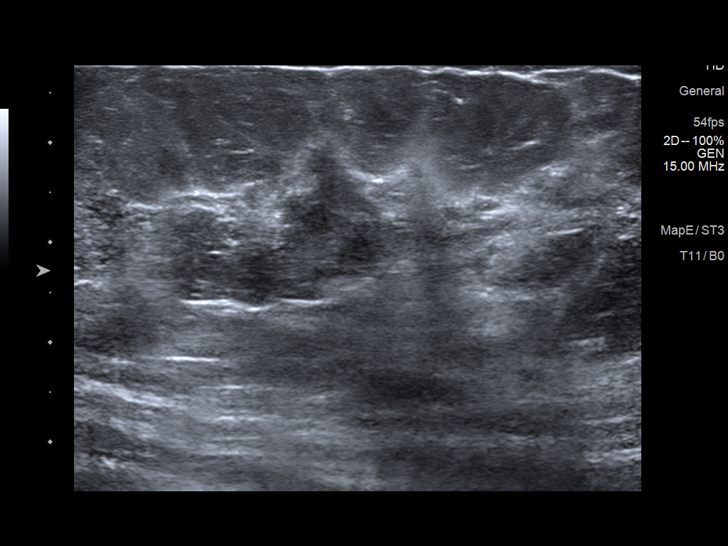

[Series 2: us breast*right* limited inc axilla · 0.07mm/px · 11 of 25 slices shown (2 of 2)]
[im 1/25]
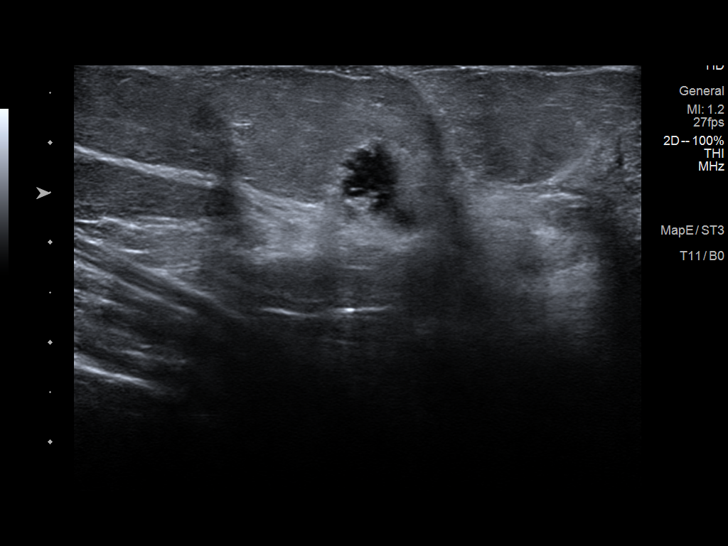
[im 3/25]
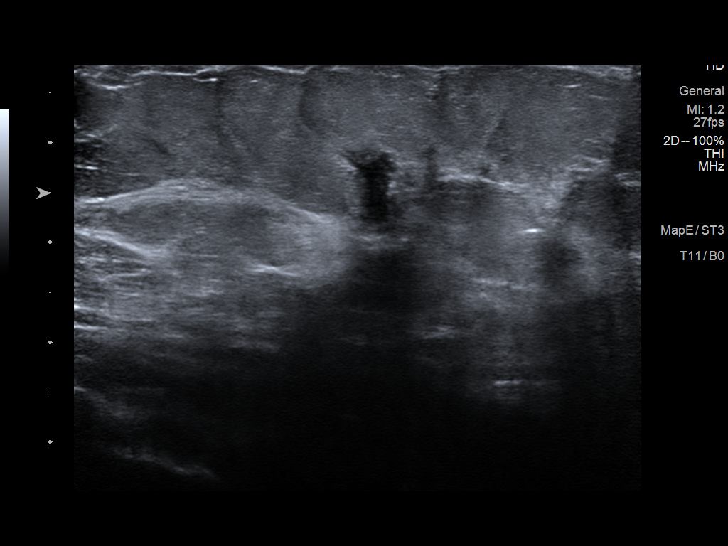
[im 5/25]
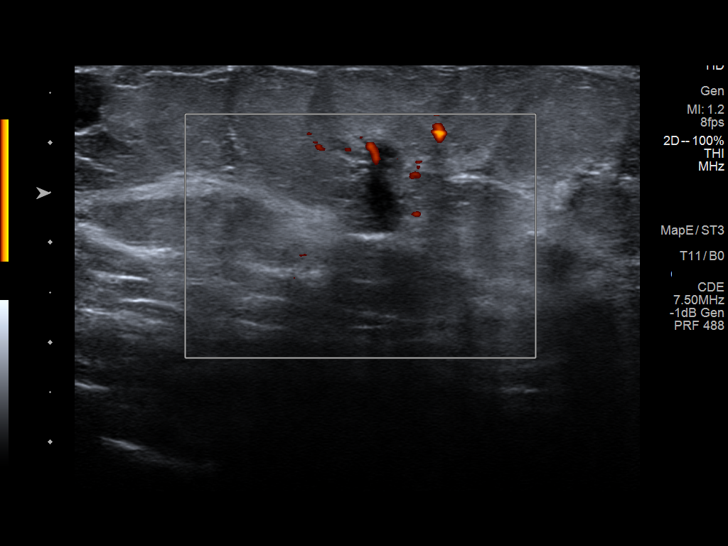
[im 7/25]
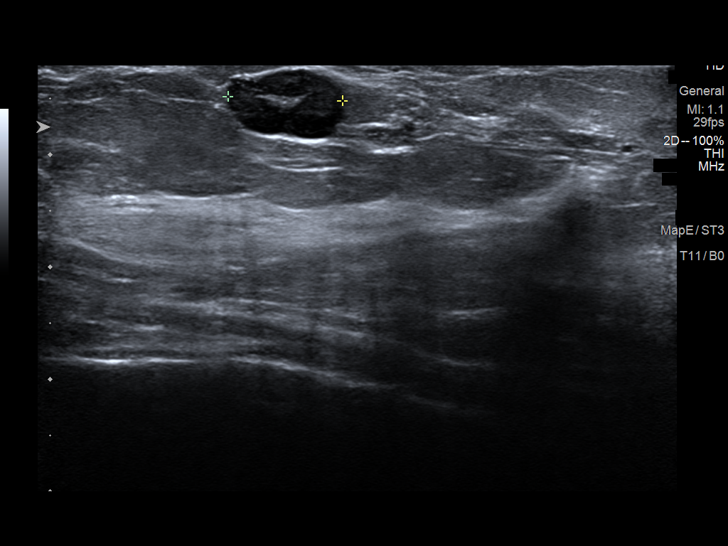
[im 10/25]
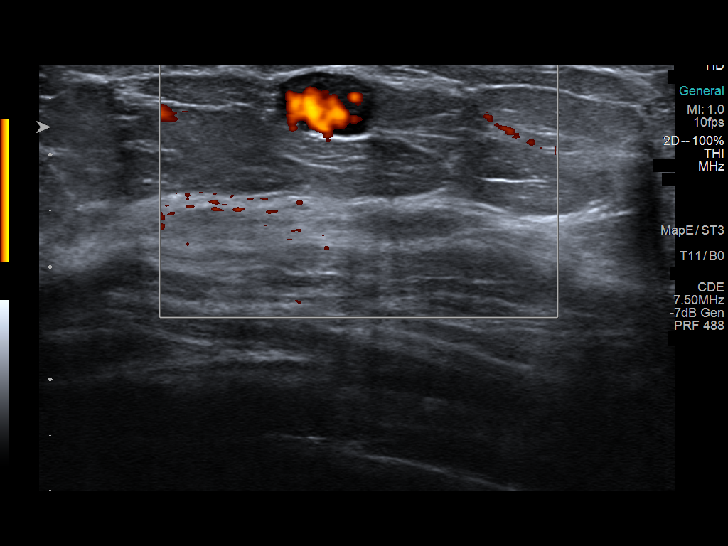
[im 12/25]
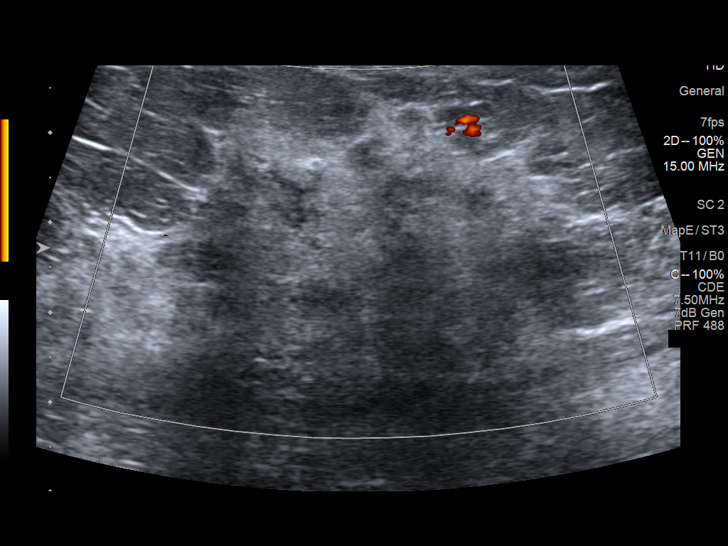
[im 14/25]
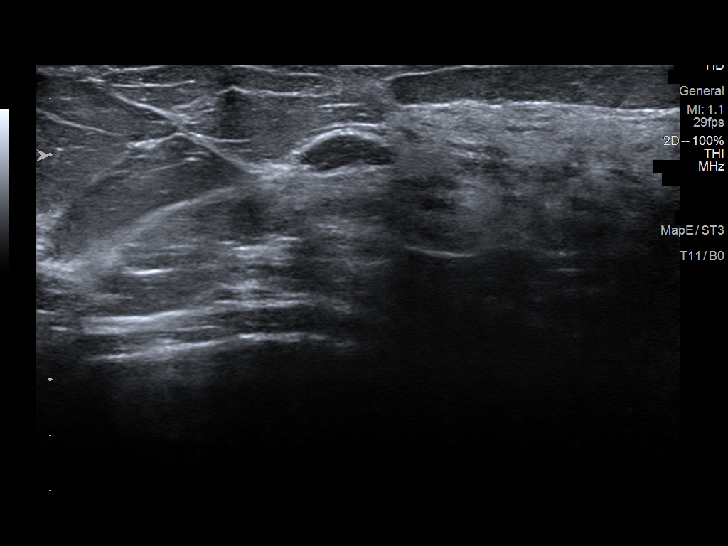
[im 17/25]
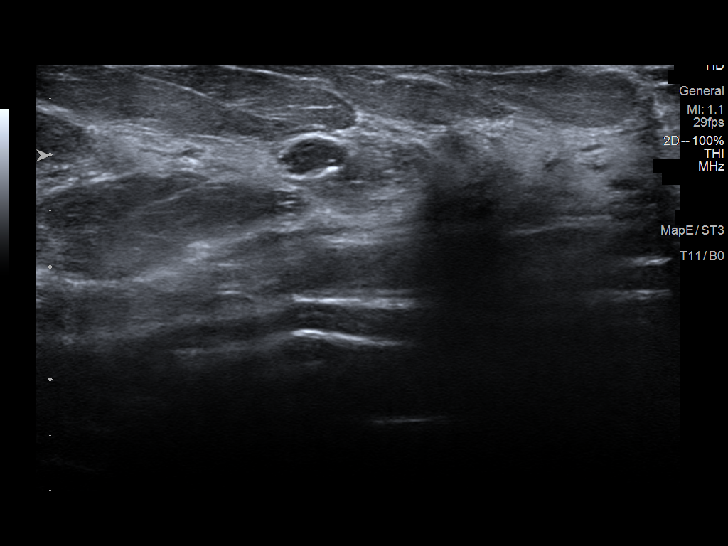
[im 19/25]
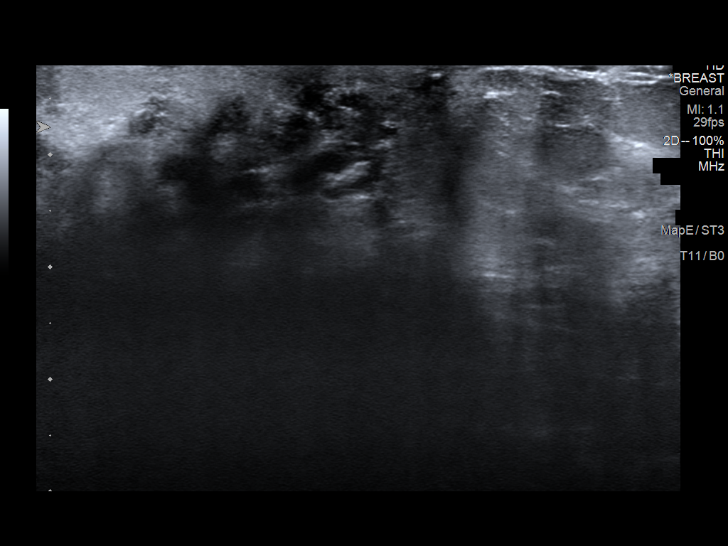
[im 21/25]
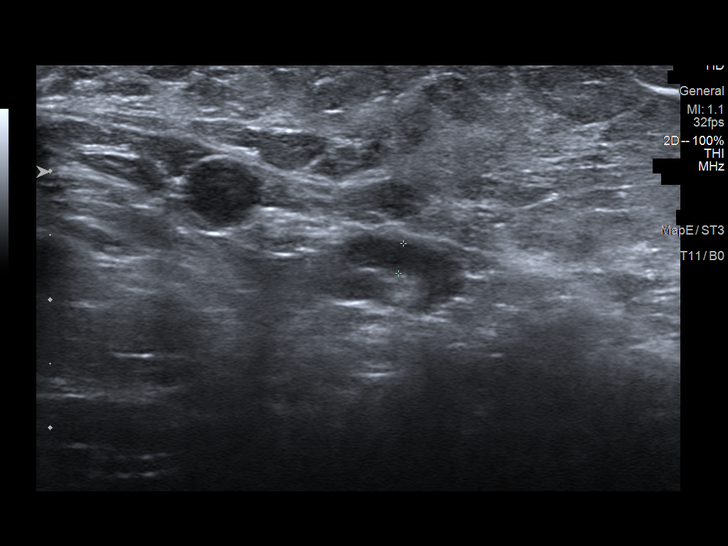
[im 23/25]
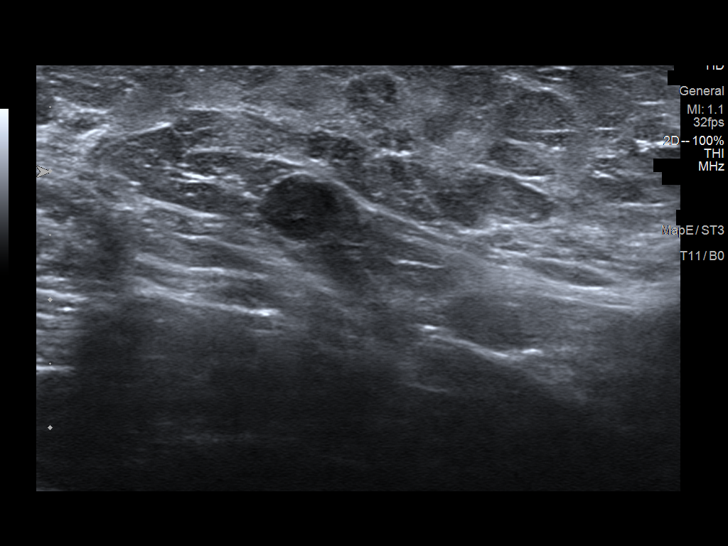

[12 of 25 positions shown; findings below may reference images not displayed]

ACR Breast Density Category c: The breast tissue is heterogeneously
dense, which may obscure small masses.
FINDINGS: A radiopaque BB was placed at the site of the patient's palpable
lumps within the right breast. Increased density with diffuse
retraction is demonstrated involving the entire central right breast
within all 4 quadrants. Note is made of a circumscribed mass in the
upper outer quadrant, likely representing an enlarged intramammary
lymph node. No definite focal or suspicious findings on the left.
Further evaluation with ultrasound was performed on the right.

On physical exam, the entire right breast is retracted [REDACTED]reased
in size when compared to the left. There is additional retraction
and dimpling of the right nipple. I palpate a large firm mass,
encompassing the entire upper and central right breast.

Targeted ultrasound is performed, showing a large, irregular mass
with associated shadowing and distortion encompassing the entire
right breast from the 11 o'clock to 2 o'clock position. Exact
measurements are difficult due to the size, but it measures at least
5.1 x 3.1 cm. There is associated vascularity. A slightly more
focal, irregular component is noted at the 11 o'clock position 5 cm
from the nipple, measuring 7 x 6 x 3 mm and at the [DATE] position 6
cm from the nipple, measuring 8 x 6 x 3 mm. There is a
morphologically abnormal intramammary lymph node with cortical
thickening up to 5 mm at the 10 o'clock position 8 cm from the
nipple. Additionally, evaluation right axilla demonstrates a
rounded, abnormal lymph node with complete hilar effacement. It
measures 6 x 6 x 5 mm. No additional axillary lymphadenopathy noted.
IMPRESSION: 1. Highly suspicious mass involving the entire central right breast
measuring up to 5.1 cm. This corresponds with the patient's palpable
lump and overall deformity of the breast. Recommendation is for
ultrasound-guided biopsy. Additional more focal areas are noted
along the 11 o'clock and [DATE] axis.
2. Suspicious, morphologically normal intramammary lymph node at the
10 o'clock position 8 cm from the nipple. Recommendation is for
ultrasound-guided biopsy.
3. Single suspicious right axillary lymph node demonstrating
complete hilar effacement. Recommendation is for ultrasound-guided
biopsy.
4. No mammographic evidence of malignancy on the left.

RECOMMENDATION:
1. Three area ultrasound-guided biopsy of the palpable right breast
mass, intramammary lymph node and axillary lymph node.
2. Pending pathology results, further evaluation with breast MRI is
recommended given the extent of disease and multiplicity of
findings.

I have discussed the findings and recommendations with the patient.
If applicable, a reminder letter will be sent to the patient
regarding the next appointment.

BI-RADS CATEGORY  5: Highly suggestive of malignancy.

## 2021-10-02 IMAGING — MG DIGITAL DIAGNOSTIC BILAT W/ TOMO W/ CAD
8 of 14 series · 8 of 40 positions shown · non-contrast
Comparison: None.

CLINICAL DATA: [age] with a large palpable lump and
worsening deformity of the right breast and right nipple.

EXAM:
ULTRASOUND RIGHT BREAST LIMITED; DIGITAL DIAGNOSTIC BILATERAL
MAMMOGRAM WITH TOMOSYNTHESIS AND CAD
TECHNIQUE: Targeted ultrasound examination of the right breast was performed;
Bilateral digital diagnostic mammography and breast tomosynthesis
was performed. The images were evaluated with computer-aided
detection.

[R MLO synth-2D (1 of 2)]
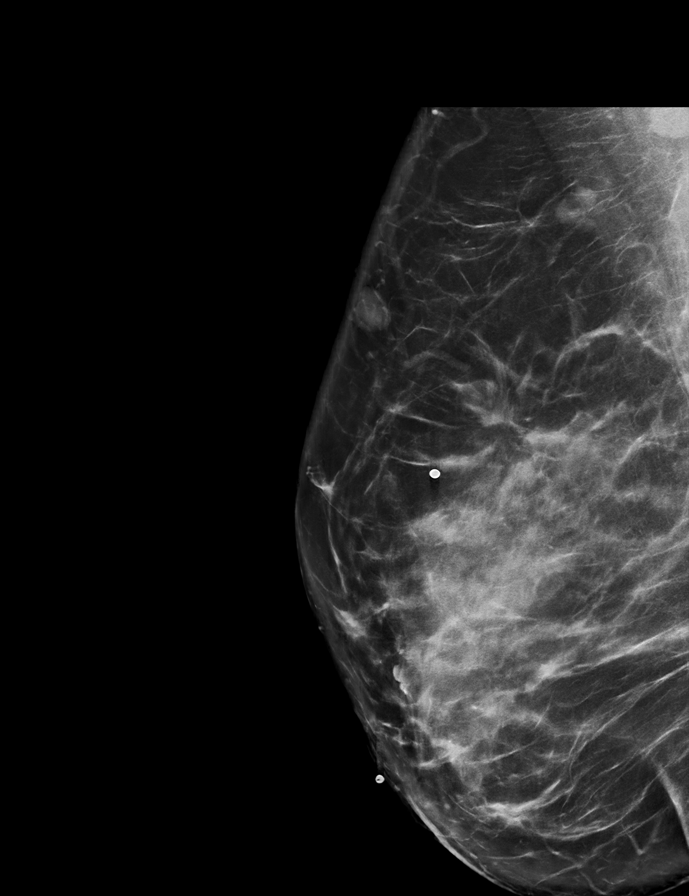

[R MLO synth-2D (2 of 2)]
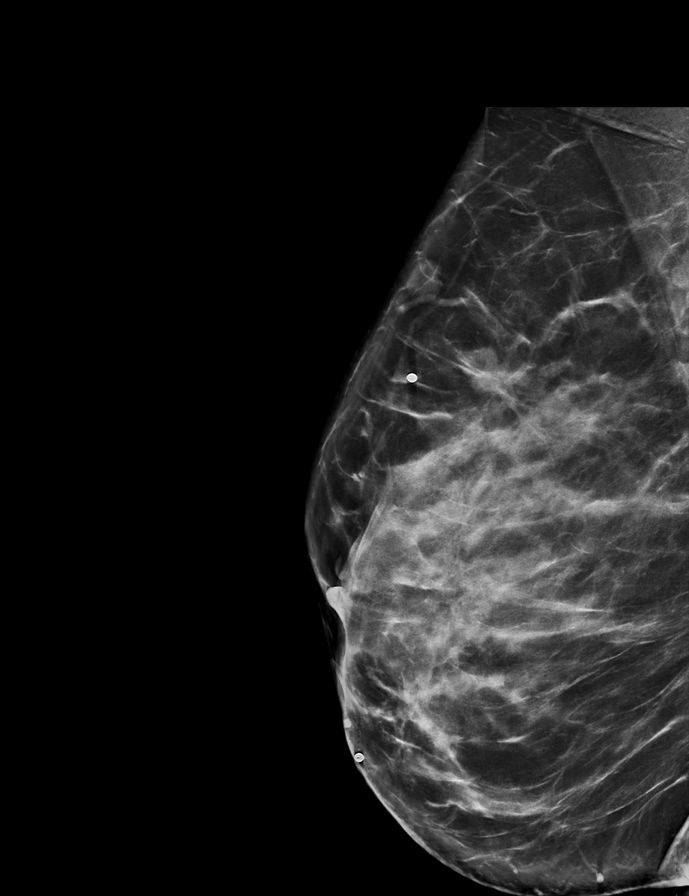

[L CC synth-2D]
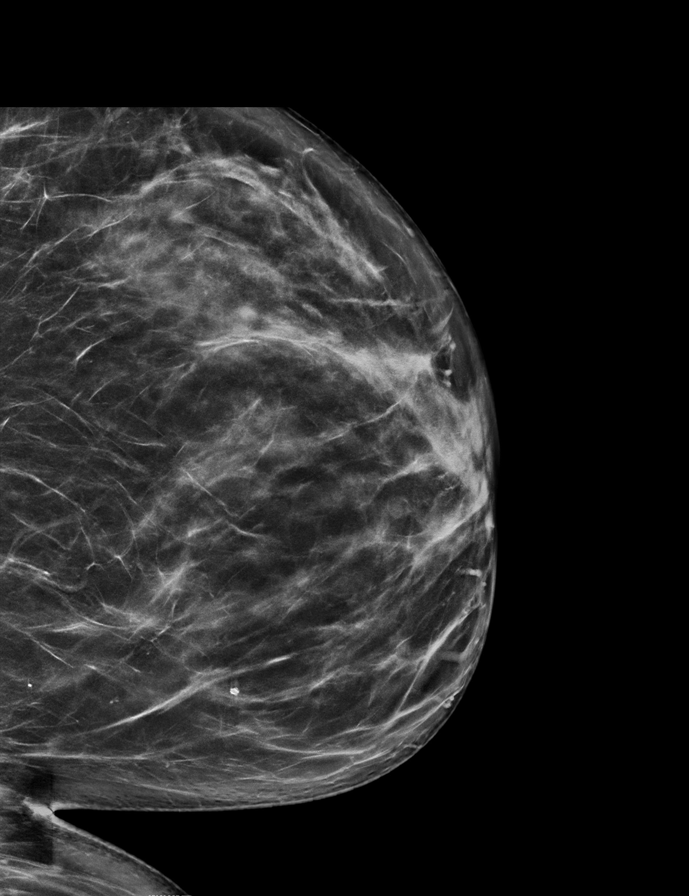

[R CC synth-2D]
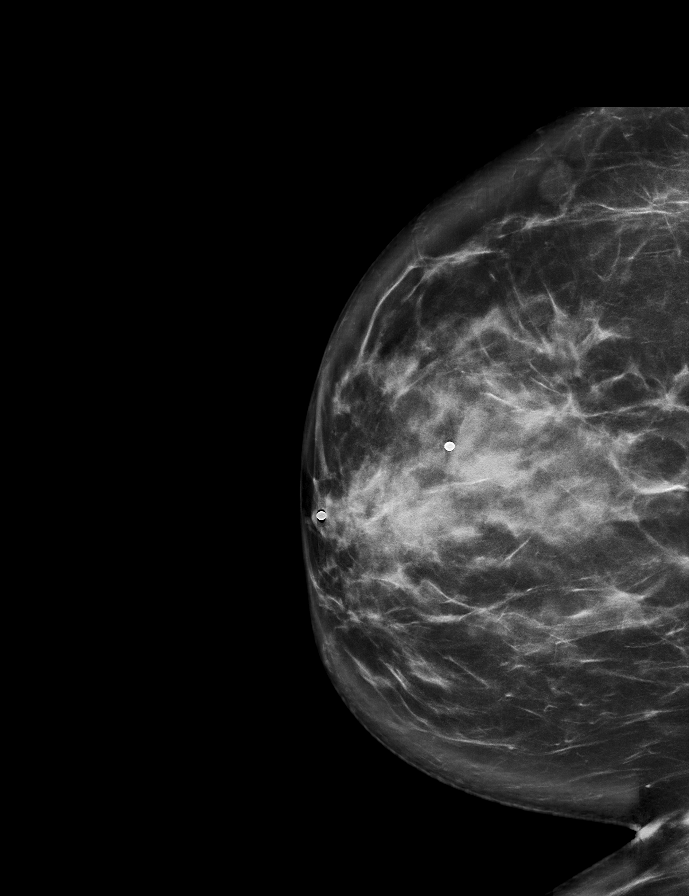

[L MLO synth-2D]
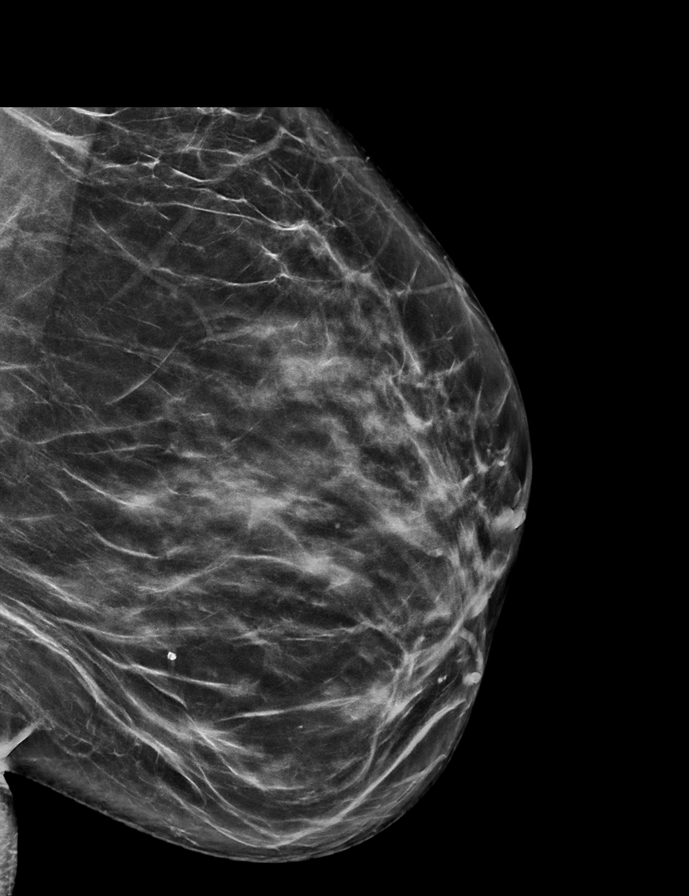

[R ML synth-2D (1 of 2)]
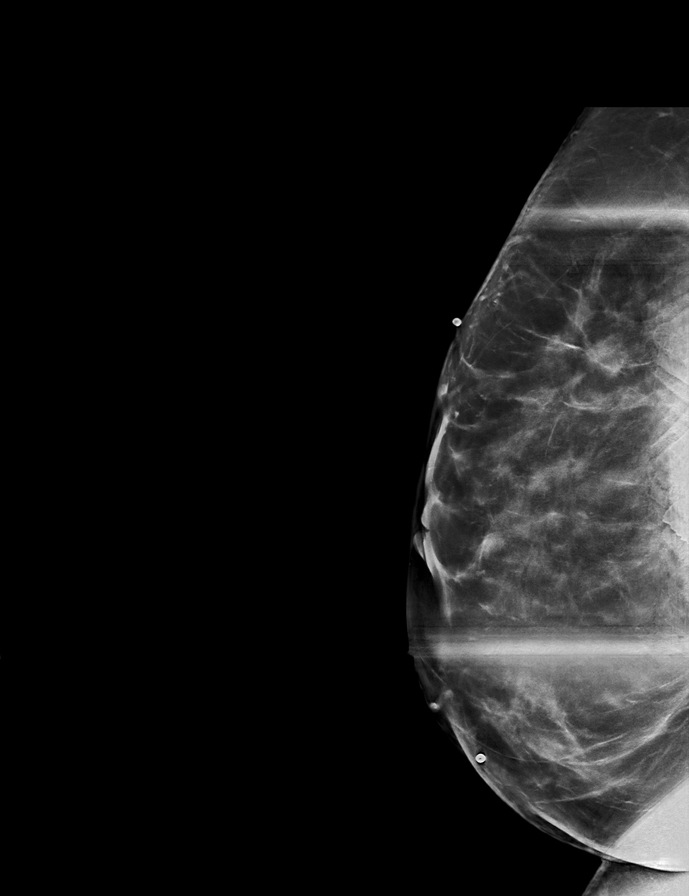

[R ML synth-2D (2 of 2)]
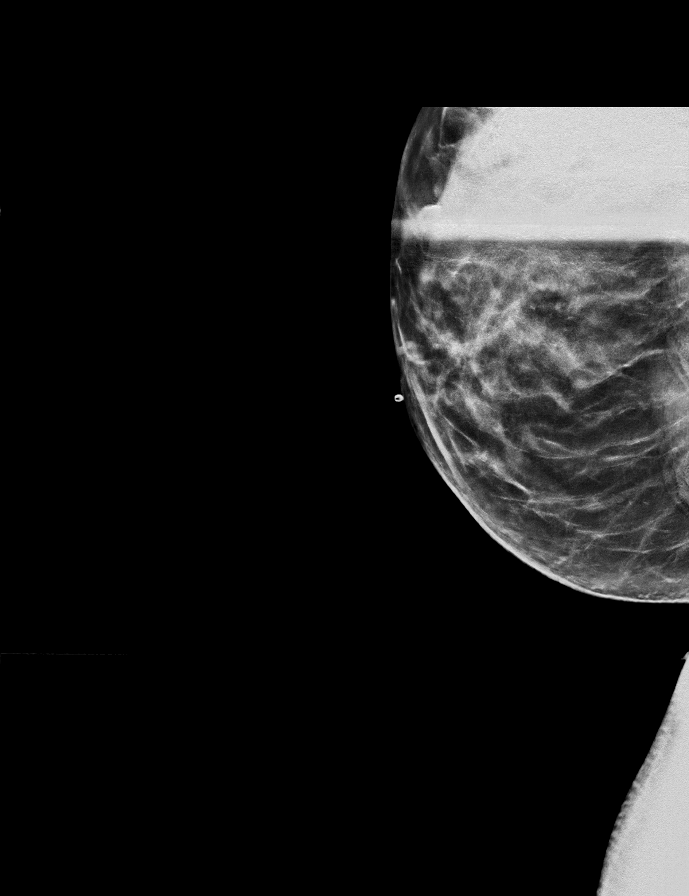

[R CC tomo · tomo slice 42/83.0]
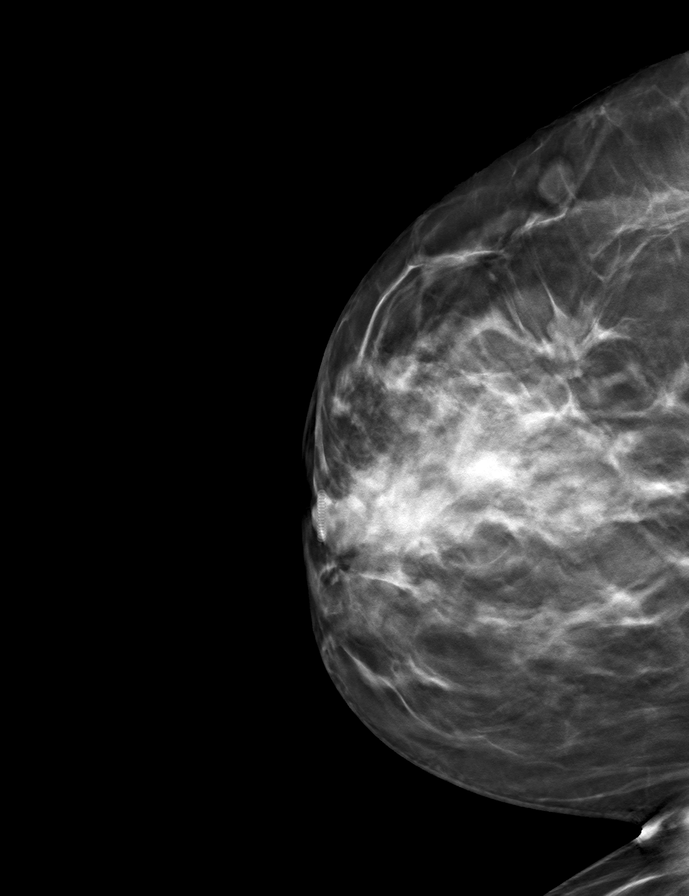

[8 of 40 positions shown; findings below may reference images not displayed]

ACR Breast Density Category c: The breast tissue is heterogeneously
dense, which may obscure small masses.
FINDINGS: A radiopaque BB was placed at the site of the patient's palpable
lumps within the right breast. Increased density with diffuse
retraction is demonstrated involving the entire central right breast
within all 4 quadrants. Note is made of a circumscribed mass in the
upper outer quadrant, likely representing an enlarged intramammary
lymph node. No definite focal or suspicious findings on the left.
Further evaluation with ultrasound was performed on the right.

On physical exam, the entire right breast is retracted [REDACTED]reased
in size when compared to the left. There is additional retraction
and dimpling of the right nipple. I palpate a large firm mass,
encompassing the entire upper and central right breast.

Targeted ultrasound is performed, showing a large, irregular mass
with associated shadowing and distortion encompassing the entire
right breast from the 11 o'clock to 2 o'clock position. Exact
measurements are difficult due to the size, but it measures at least
5.1 x 3.1 cm. There is associated vascularity. A slightly more
focal, irregular component is noted at the 11 o'clock position 5 cm
from the nipple, measuring 7 x 6 x 3 mm and at the [DATE] position 6
cm from the nipple, measuring 8 x 6 x 3 mm. There is a
morphologically abnormal intramammary lymph node with cortical
thickening up to 5 mm at the 10 o'clock position 8 cm from the
nipple. Additionally, evaluation right axilla demonstrates a
rounded, abnormal lymph node with complete hilar effacement. It
measures 6 x 6 x 5 mm. No additional axillary lymphadenopathy noted.
IMPRESSION: 1. Highly suspicious mass involving the entire central right breast
measuring up to 5.1 cm. This corresponds with the patient's palpable
lump and overall deformity of the breast. Recommendation is for
ultrasound-guided biopsy. Additional more focal areas are noted
along the 11 o'clock and [DATE] axis.
2. Suspicious, morphologically normal intramammary lymph node at the
10 o'clock position 8 cm from the nipple. Recommendation is for
ultrasound-guided biopsy.
3. Single suspicious right axillary lymph node demonstrating
complete hilar effacement. Recommendation is for ultrasound-guided
biopsy.
4. No mammographic evidence of malignancy on the left.

RECOMMENDATION:
1. Three area ultrasound-guided biopsy of the palpable right breast
mass, intramammary lymph node and axillary lymph node.
2. Pending pathology results, further evaluation with breast MRI is
recommended given the extent of disease and multiplicity of
findings.

I have discussed the findings and recommendations with the patient.
If applicable, a reminder letter will be sent to the patient
regarding the next appointment.

BI-RADS CATEGORY  5: Highly suggestive of malignancy.

## 2021-10-08 ENCOUNTER — Ambulatory Visit
Admission: RE | Admit: 2021-10-08 | Discharge: 2021-10-08 | Disposition: A | Discharge status: Home / Self Care | Payer: Medicaid Other | Source: Ambulatory Visit | Attending: Physician Assistant | Admitting: Physician Assistant

## 2021-10-08 DIAGNOSIS — R599 Enlarged lymph nodes, unspecified: Secondary | ICD-10-CM

## 2021-10-08 DIAGNOSIS — N6310 Unspecified lump in the right breast, unspecified quadrant: Secondary | ICD-10-CM

## 2021-10-08 IMAGING — US US  BREAST BX W/ LOC DEV 1ST LESION IMG BX SPEC US GUIDE*R*
1 series · 15 of 18 positions shown · non-contrast
Comparison: Previous exam(s).
COMPARISON: Previous exam(s).

Addendum:
CLINICAL DATA: Patient with suspicious large right breast mass,
right axillary lymph node and intramammary lymph node.

EXAM:
ULTRASOUND GUIDED RIGHT BREAST CORE NEEDLE BIOPSY

[Series 1: us breast bx w/ loc dev 1st lesion img bx spec us  · 0.06mm/px · 15 of 18 slices shown]
[im 1/18]
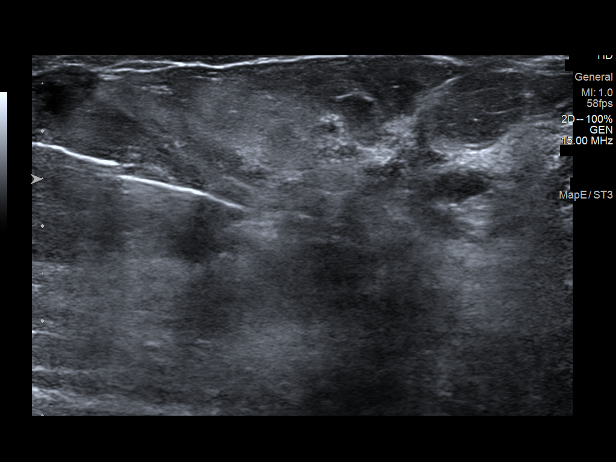
[im 2/18]
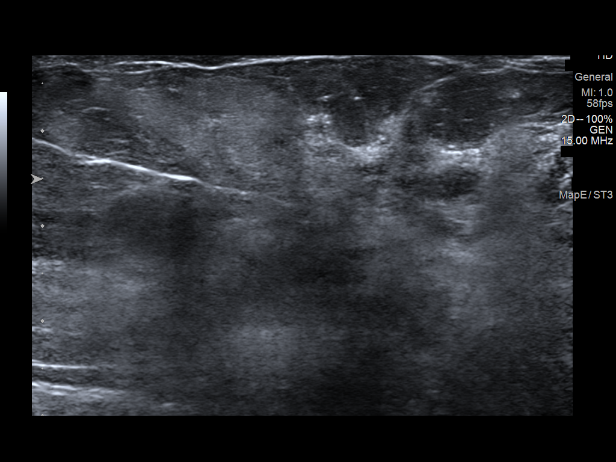
[im 4/18]
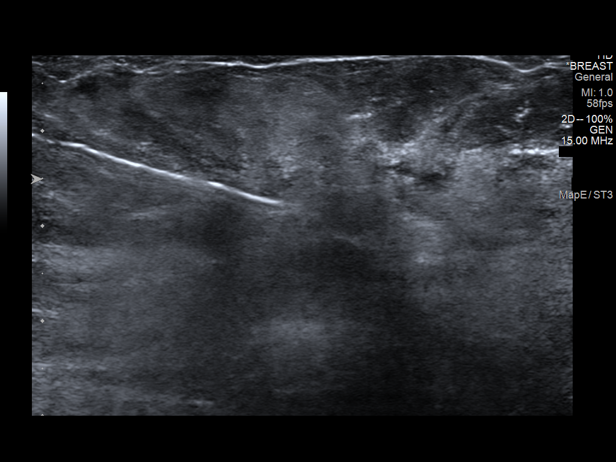
[im 5/18]
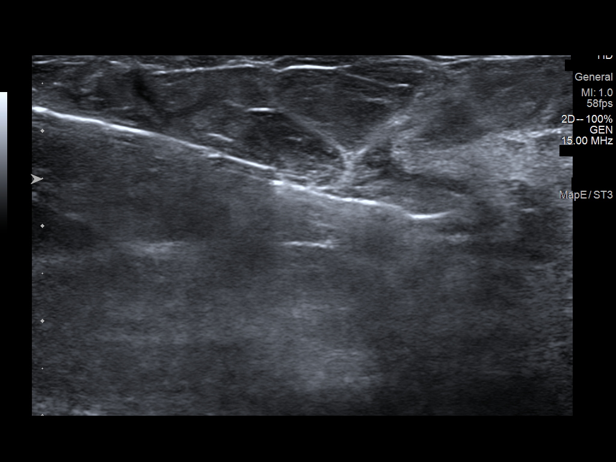
[im 6/18]
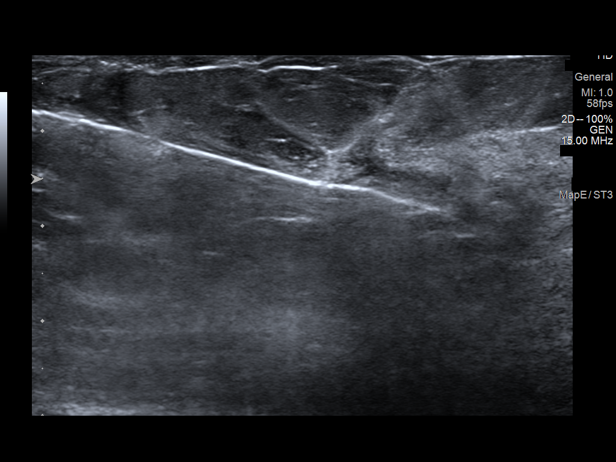
[im 7/18]
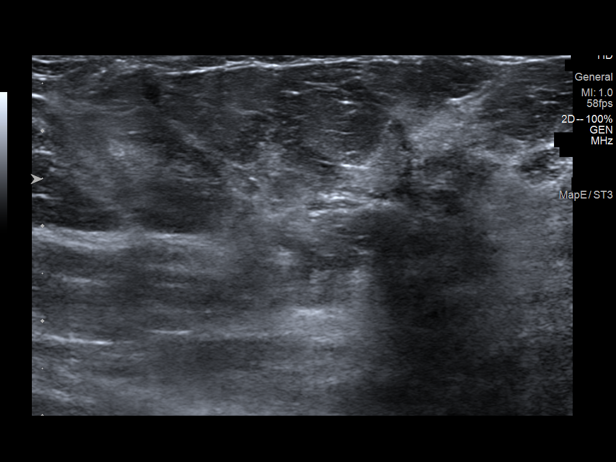
[im 8/18]
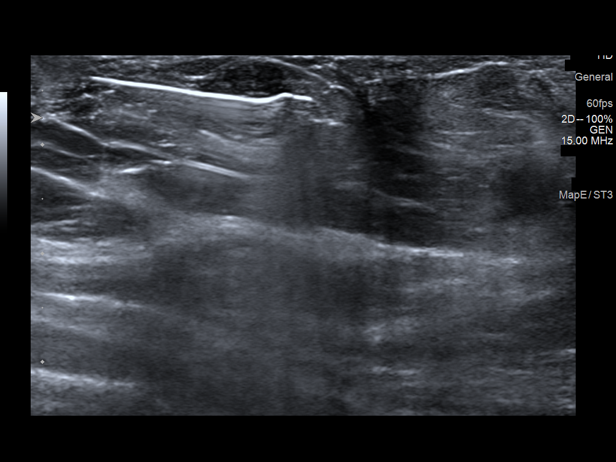
[im 10/18]
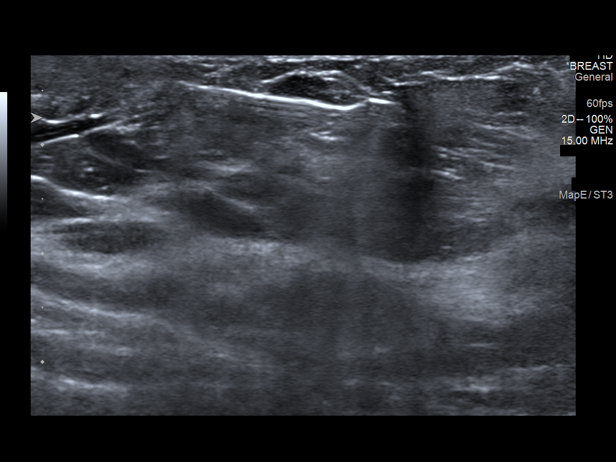
[im 11/18]
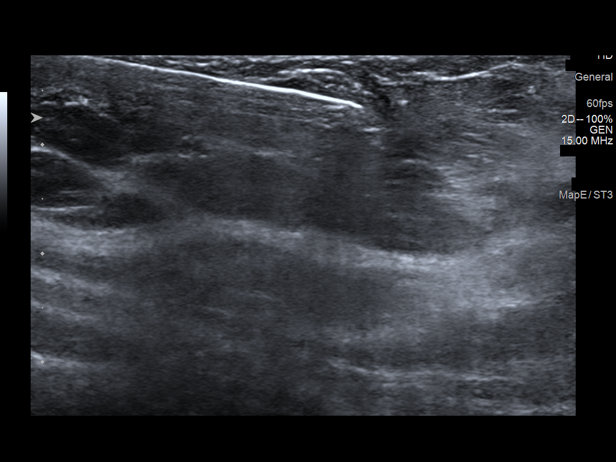
[im 12/18]
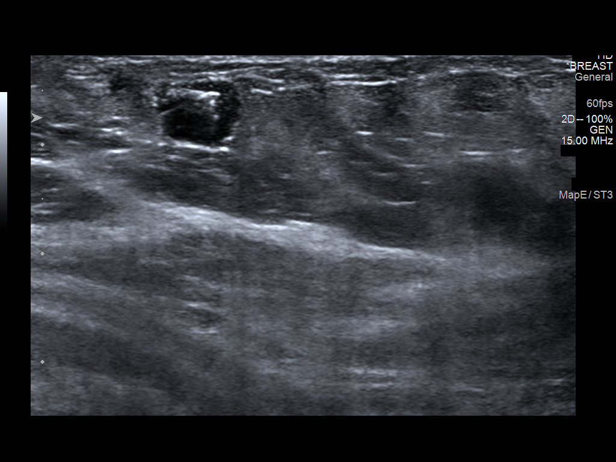
[im 13/18]
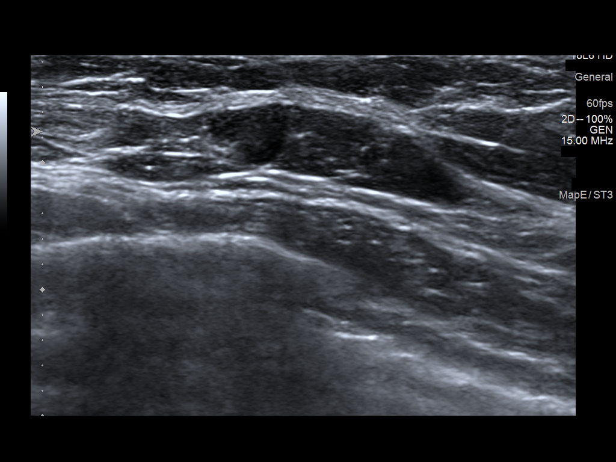
[im 14/18]
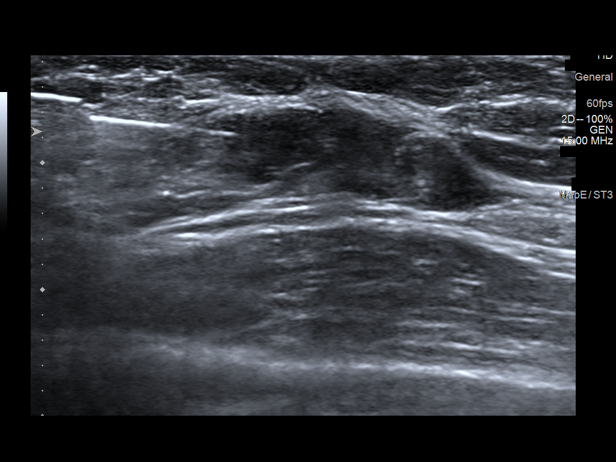
[im 16/18]
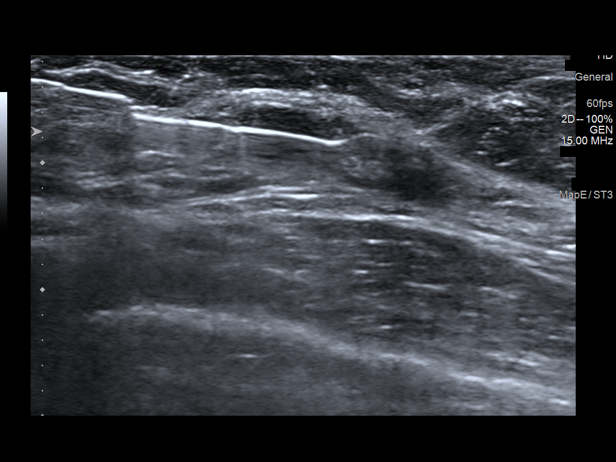
[im 17/18]
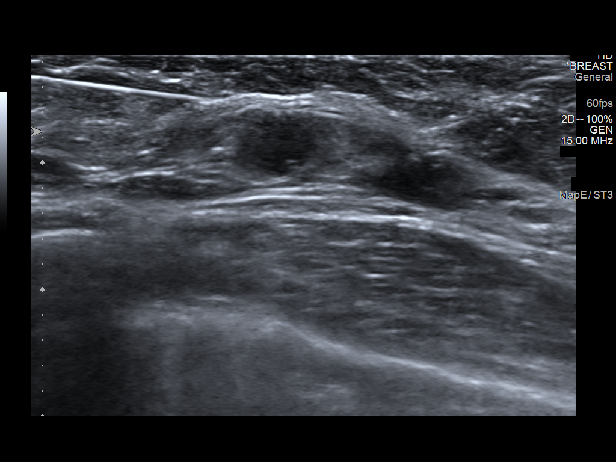
[im 18/18]
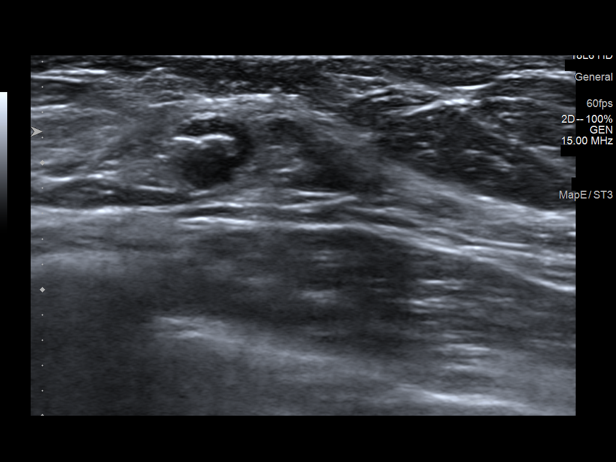

[15 of 18 positions shown; findings below may reference images not displayed]



Right breast mass 12 o'clock position:

Lesion quadrant: Upper outer quadrant

Using sterile technique and 1% Lidocaine as local anesthetic, under
direct ultrasound visualization, a 14 gauge DANZU device was
used to perform biopsy of right breast mass 12 o'clock position
using a lateral approach. At the conclusion of the procedure coil
tissue marker clip was deployed into the biopsy cavity. Follow up 2
view mammogram was performed and dictated separately.

Right breast intramammary lymph node 10 o'clock position:

Lesion quadrant: Upper outer quadrant

Using sterile technique and 1% Lidocaine as local anesthetic, under
direct ultrasound visualization, a 14 gauge DANZU device was
used to perform biopsy of right breast intramammary lymph node 10
o'clock position using a lateral approach. At the conclusion of the
procedure ribbon tissue marker clip was deployed into the biopsy
cavity. Follow up 2 view mammogram was performed and dictated
separately.

Low right axillary lymph node:

Lesion quadrant: Upper outer quadrant

Using sterile technique and 1% Lidocaine as local anesthetic, under
direct ultrasound visualization, a 14 gauge DANZU device was
used to perform biopsy of low right axillary lymph node using a
lateral approach. At the conclusion of the procedure heart tissue
marker clip was deployed into the biopsy cavity. Follow up 2 view
mammogram was performed and dictated separately.
IMPRESSION: Ultrasound guided biopsy of right breast mass, right breast
intramammary node and low right axillary node. No apparent
complications.

ADDENDUM:
Pathology revealed GRADE II INVASIVE MAMMARY CARCINOMA of the RIGHT
breast, 12 o'clock, [S3], (coil clip). E-cadherin is POSITIVE
supporting a ductal origin. This was found to be concordant by Dr.
DANZU.

Pathology revealed INTRAMAMMARY LYMPH NODE WITH CARCINOMA of the
RIGHT breast, 10 o'clock, [S3], (ribbon clip). This was found to be
concordant by Dr. DANZU.

Pathology revealed METASTATIC CARCINOMA INVOLVING NODAL TISSUE of
the RIGHT axilla, (heart clip). E-cadherin is POSITIVE supporting a
ductal origin. This was found to be concordant by Dr. DANZU.

Pathology results were discussed with the patient by telephone. The
patient reported doing well after the biopsies with tenderness and
bruising at the sites. Post biopsy instructions and care were
reviewed and questions were answered. The patient was encouraged to
call The [REDACTED] for any additional
concerns. My direct phone number was provided.

Medical oncology consultation has been arranged with Dr. DANZU
DANZU at [HOSPITAL] [HOSPITAL] on [DATE].

Surgical consultation has been arranged with Dr. DANZU at
[REDACTED] on [DATE].

Recommendation for a bilateral breast MRI to exclude any additional
sites of disease given multiplicity of findings, age,
heterogeneously dense breast tissue and family history.

Pathology results reported by DANZU, RN on [DATE].



Right breast mass 12 o'clock position:

Lesion quadrant: Upper outer quadrant

Using sterile technique and 1% Lidocaine as local anesthetic, under
direct ultrasound visualization, a 14 gauge DANZU device was
used to perform biopsy of right breast mass 12 o'clock position
using a lateral approach. At the conclusion of the procedure coil
tissue marker clip was deployed into the biopsy cavity. Follow up 2
view mammogram was performed and dictated separately.

Right breast intramammary lymph node 10 o'clock position:

Lesion quadrant: Upper outer quadrant

Using sterile technique and 1% Lidocaine as local anesthetic, under
direct ultrasound visualization, a 14 gauge DANZU device was
used to perform biopsy of right breast intramammary lymph node 10
o'clock position using a lateral approach. At the conclusion of the
procedure ribbon tissue marker clip was deployed into the biopsy
cavity. Follow up 2 view mammogram was performed and dictated
separately.

Low right axillary lymph node:

Lesion quadrant: Upper outer quadrant

Using sterile technique and 1% Lidocaine as local anesthetic, under
direct ultrasound visualization, a 14 gauge DANZU device was
used to perform biopsy of low right axillary lymph node using a
lateral approach. At the conclusion of the procedure heart tissue
marker clip was deployed into the biopsy cavity. Follow up 2 view
mammogram was performed and dictated separately.
IMPRESSION: Ultrasound guided biopsy of right breast mass, right breast
intramammary node and low right axillary node. No apparent
complications.

## 2021-10-13 ENCOUNTER — Telehealth: Payer: Self-pay | Admitting: *Deleted

## 2021-10-13 ENCOUNTER — Encounter: Payer: Self-pay | Admitting: *Deleted

## 2021-10-13 NOTE — Telephone Encounter (Signed)
Called pt to provide navigation resources and contact information. Left vm requesting return call with questions or needs.

## 2021-10-15 NOTE — Progress Notes (Signed)
[facility] CONSULT NOTE  Patient Care Team: Patient, No Pcp Per (Inactive) as PCP - General (General Practice) [name], RN as Oncology Nurse Navigator [name], RN as Oncology Nurse Navigator  CHIEF COMPLAINTS/PURPOSE OF CONSULTATION:  Newly diagnosed right breast cancer  HISTORY OF PRESENTING ILLNESS:  Monica [name] 25 y.o. female is here because of recent diagnosis of invasive mammary carcinoma of the right breast. She presented with a palpable lump and worsening deformity of the right breast and nipple. Diagnostic mammogram and [place] on 10/02/2021 showed highly suspicious mass involving the entire central right breast measuring up to 5.1 cm, suspicious morphologically normal intramammary lymph node at the 10 o'clock position 8 cm from the nipple, and a single suspicious right axillary lymph node demonstrating complete hilar effacement. Biopsy on 10/08/2021 showed invasive mammary carcinoma with lymph node positive for metastatic carcinoma, ER+(95%)/PR(40%). She presents to the clinic today for initial evaluation and discussion of treatment options.   I reviewed her records extensively and collaborated the history with the patient.  SUMMARY OF ONCOLOGIC HISTORY: Oncology History   No history exists.    MEDICAL HISTORY:  Past Medical History:  Diagnosis Date   Abscess     SURGICAL HISTORY: No past surgical history on file.  SOCIAL HISTORY: Social History   Socioeconomic History   Marital status: Single    Spouse name: Not on file   Number of children: Not on file   Years of education: Not on file   Highest education level: Not on file  Occupational History   Not on file  Tobacco Use   Smoking status: Never   Smokeless tobacco: Never  Substance and Sexual Activity   Alcohol use: No   Drug use: Never   Sexual activity: Yes    Birth control/protection: None  Other Topics Concern   Not on file  Social History Narrative   Not on file    Social Determinants of Health   Financial Resource Strain: Not on file  Food Insecurity: Not on file  Transportation Needs: Not on file  Physical Activity: Not on file  Stress: Not on file  Social Connections: Not on file  Intimate Partner Violence: Not on file    FAMILY HISTORY: Family History  Problem Relation Age of Onset   Breast cancer Maternal Grandmother 40    ALLERGIES:  has No Known Allergies.  MEDICATIONS:  Current Outpatient Medications  Medication Sig Dispense Refill   meclizine (ANTIVERT) 25 MG tablet Take 1 tablet (25 mg total) by mouth 3 (three) times daily as needed. 30 tablet 0   methocarbamol (ROBAXIN) 500 MG tablet Take 1 tablet (500 mg total) by mouth 2 (two) times daily. 14 tablet 0   No current facility-administered medications for this visit.    REVIEW OF SYSTEMS:   Constitutional: Denies fevers, chills or abnormal night sweats Eyes: Denies blurriness of vision, double vision or watery eyes Ears, nose, mouth, throat, and face: Denies mucositis or sore throat Respiratory: Denies cough, dyspnea or wheezes Cardiovascular: Denies palpitation, chest discomfort or lower extremity swelling Gastrointestinal:  Denies nausea, heartburn or change in bowel habits Skin: Denies abnormal skin rashes Lymphatics: Denies new lymphadenopathy or easy bruising Neurological:Denies numbness, tingling or new weaknesses Behavioral/Psych: Mood is stable, no new changes  Breast: Palpable lump in the breast All other systems were reviewed with the patient and are negative.  PHYSICAL EXAMINATION: ECOG PERFORMANCE STATUS: 1 - Symptomatic but completely ambulatory  Vitals:   10/16/21 1424  BP: 124/80  Pulse: 86  Resp: 18  Temp: 97.6 F (36.4 C)  SpO2: 100%   Filed Weights   10/16/21 1424  Weight: 139 lb 8 oz (63.3 kg)    GENERAL:alert, no distress and comfortable SKIN: skin color, texture, turgor are normal, no rashes or significant lesions EYES: normal,  conjunctiva are pink and non-injected, sclera clear OROPHARYNX:no exudate, no erythema and lips, buccal mucosa, and tongue normal  NECK: supple, thyroid normal size, non-tender, without nodularity LYMPH:  no palpable lymphadenopathy in the cervical, axillary or inguinal LUNGS: clear to auscultation and percussion with normal breathing effort HEART: regular rate & rhythm and no murmurs and no lower extremity edema ABDOMEN:abdomen soft, non-tender and normal bowel sounds Musculoskeletal:no cyanosis of digits and no clubbing  PSYCH: alert & oriented x 3 with fluent speech NEURO: no focal motor/sensory deficits BREAST: Very large palpable mass in the right breast central location easily 5 to 7 cm in diameter (exam performed in the presence of a chaperone)     ASSESSMENT AND PLAN:  Malignant neoplasm of upper-outer quadrant of right breast in female, estrogen receptor positive ([place]) 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, FISH negative    Pathology and radiology counseling: Discussed with the patient, the details of pathology including the type of breast cancer,the clinical staging, the significance of ER, PR and HER-2/neu receptors and the implications for treatment. After reviewing the pathology in detail, we proceeded to discuss the different treatment options between surgery, radiation, chemotherapy, antiestrogen therapies.  Treatment plan: 1.  Neoadjuvant chemotherapy with dose dense Adriamycin and Cytoxan followed by Taxol weekly x12   2. Mastectomy versus lumpectomy with targeted node dissection 3.  Adjuvant radiation 4.  Followed by adjuvant  antiestrogen therapy (ovarian suppression) along with CDK 4 and 6 inhibitor 5.  Genetic testing  Plan: 1. Port placement 2. chemo class 3. ECHO  Fertility counseling: Because she had 2 children she is not interested in having anymore kids.  Her children are aged 61 and 33. RTC to start chemo    All questions were answered. The patient knows to call the clinic with any problems, questions or concerns.   Rulon Eisenmenger, MD, MPH 10/16/2021    I, Thana Ates, am acting as scribe for [name], MD.  I have reviewed the above documentation for accuracy and completeness, and I agree with the above.

## 2021-10-16 ENCOUNTER — Telehealth: Payer: Self-pay

## 2021-10-16 ENCOUNTER — Other Ambulatory Visit: Payer: Self-pay

## 2021-10-16 ENCOUNTER — Inpatient Hospital Stay: Payer: Medicaid Other | Attending: Hematology and Oncology | Admitting: Hematology and Oncology

## 2021-10-16 VITALS — BP 124/80 | HR 86 | Temp 97.6°F | Resp 18 | Ht 66.0 in | Wt 139.5 lb

## 2021-10-16 DIAGNOSIS — Z170 Estrogen receptor positive status [ER+]: Secondary | ICD-10-CM | POA: Diagnosis not present

## 2021-10-16 DIAGNOSIS — C50411 Malignant neoplasm of upper-outer quadrant of right female breast: Secondary | ICD-10-CM | POA: Insufficient documentation

## 2021-10-16 DIAGNOSIS — C801 Malignant (primary) neoplasm, unspecified: Secondary | ICD-10-CM

## 2021-10-16 HISTORY — DX: Malignant (primary) neoplasm, unspecified: C80.1

## 2021-10-16 MED ORDER — ONDANSETRON HCL 8 MG PO TABS: 8.0000 mg | ORAL_TABLET | Freq: Two times a day (BID) | ORAL | 1 refills | Status: DC | PRN

## 2021-10-16 MED ORDER — LIDOCAINE-PRILOCAINE 2.5-2.5 % EX CREA: TOPICAL_CREAM | CUTANEOUS | 3 refills | Status: DC

## 2021-10-16 MED ORDER — DEXAMETHASONE 4 MG PO TABS: 4.0000 mg | ORAL_TABLET | Freq: Every day | ORAL | 0 refills | Status: DC

## 2021-10-16 MED ORDER — PROCHLORPERAZINE MALEATE 10 MG PO TABS: 10.0000 mg | ORAL_TABLET | Freq: Four times a day (QID) | ORAL | 1 refills | Status: DC | PRN

## 2021-10-16 MED ORDER — LORAZEPAM 0.5 MG PO TABS: 0.5000 mg | ORAL_TABLET | Freq: Every evening | ORAL | 0 refills | Status: DC | PRN

## 2021-10-16 NOTE — Telephone Encounter (Signed)
Attempt to call pt x 2 to share path results, voicemail is full so I was unable to leave a message

## 2021-10-16 NOTE — Progress Notes (Signed)
START ON PATHWAY REGIMEN - Breast     Cycles 1 through 4: A cycle is every 14 days:     Doxorubicin      Cyclophosphamide      [id number]    Cycles 5 through 16: A cycle is every 7 days:     Paclitaxel   **Always confirm dose/schedule in your pharmacy ordering system**  Patient Characteristics: Preoperative or Nonsurgical Candidate (Clinical Staging), Neoadjuvant Therapy followed by Surgery, Invasive Disease, Chemotherapy, HER2 Negative/Unknown/Equivocal, ER Positive Therapeutic Status: Preoperative or Nonsurgical Candidate (Clinical Staging) AJCC M Category: cM0 AJCC Grade: G2 Breast Surgical Plan: Neoadjuvant Therapy followed by Surgery ER Status: Positive (+) AJCC 8 Stage Grouping: IIA HER2 Status: Negative (-) AJCC T Category: cT3 AJCC N Category: cN1 PR Status: Positive (+) Intent of Therapy: Curative Intent, Discussed with Patient 

## 2021-10-16 NOTE — Assessment & Plan Note (Signed)
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, FISH pending  Pathology and radiology counseling: Discussed with the patient, the details of pathology including the type of breast cancer,the clinical staging, the significance of ER, PR and HER-2/neu receptors and the implications for treatment. After reviewing the pathology in detail, we proceeded to discuss the different treatment options between surgery, radiation, chemotherapy, antiestrogen therapies.  Treatment plan: 1.  Neoadjuvant chemotherapy with dose dense Adriamycin and Cytoxan followed by Taxol weekly x12 2. Mastectomy 3.  Adjuvant radiation 4.  Followed by adjuvant antiestrogen therapy (ovarian suppression) along with CDK 4 and 6 inhibitor 5.  Genetic testing  Plan: 1. Port placement 2. chemo class 3. ECHO  RTC to start chemo

## 2021-10-20 ENCOUNTER — Encounter: Payer: Self-pay | Admitting: *Deleted

## 2021-10-20 ENCOUNTER — Other Ambulatory Visit: Payer: Self-pay | Admitting: General Surgery

## 2021-10-20 ENCOUNTER — Other Ambulatory Visit: Payer: Self-pay | Admitting: *Deleted

## 2021-10-20 ENCOUNTER — Telehealth: Payer: Self-pay | Admitting: *Deleted

## 2021-10-20 DIAGNOSIS — C50411 Malignant neoplasm of upper-outer quadrant of right female breast: Secondary | ICD-10-CM

## 2021-10-20 NOTE — Telephone Encounter (Signed)
Spoke to pt, provided navigation resources and contact information. Denies questions or concerns regarding dx or treatment care plan. Encourage pt to call with needs. Received verbal understanding.

## 2021-10-22 ENCOUNTER — Telehealth: Payer: Self-pay | Admitting: Hematology and Oncology

## 2021-10-22 ENCOUNTER — Encounter (HOSPITAL_COMMUNITY): Payer: Self-pay | Admitting: General Surgery

## 2021-10-22 ENCOUNTER — Encounter: Payer: Self-pay | Admitting: Hematology and Oncology

## 2021-10-22 ENCOUNTER — Ambulatory Visit
Admission: RE | Admit: 2021-10-22 | Discharge: 2021-10-22 | Disposition: A | Discharge status: Home / Self Care | Payer: Medicaid Other | Source: Ambulatory Visit | Attending: Hematology and Oncology | Admitting: Hematology and Oncology

## 2021-10-22 ENCOUNTER — Other Ambulatory Visit: Payer: Self-pay

## 2021-10-22 ENCOUNTER — Ambulatory Visit: Payer: Medicaid Other | Attending: Hematology and Oncology

## 2021-10-22 ENCOUNTER — Other Ambulatory Visit: Payer: Self-pay | Admitting: *Deleted

## 2021-10-22 ENCOUNTER — Other Ambulatory Visit: Payer: Self-pay | Admitting: Hematology and Oncology

## 2021-10-22 DIAGNOSIS — R293 Abnormal posture: Secondary | ICD-10-CM | POA: Diagnosis not present

## 2021-10-22 DIAGNOSIS — Z170 Estrogen receptor positive status [ER+]: Secondary | ICD-10-CM | POA: Diagnosis not present

## 2021-10-22 DIAGNOSIS — C50411 Malignant neoplasm of upper-outer quadrant of right female breast: Secondary | ICD-10-CM | POA: Diagnosis not present

## 2021-10-22 DIAGNOSIS — H8113 Benign paroxysmal vertigo, bilateral: Secondary | ICD-10-CM | POA: Insufficient documentation

## 2021-10-22 MED ORDER — GADOBUTROL 1 MMOL/ML IV SOLN
6.0000 mL | Freq: Once | INTRAVENOUS | Status: AC | PRN
  Administered 2021-10-22: 6 mL via INTRAVENOUS

## 2021-10-22 NOTE — Telephone Encounter (Signed)
Patient returned call and we went through upcoming appointments

## 2021-10-22 NOTE — Therapy (Signed)
[facility] @ [facility] [place], [place], [address] Phone: [phone]   Fax:  [phone]  Physical Therapy Evaluation  Patient Details  Name: Monica [name] MRN: [id number] Date of Birth: 1996/08/15 Referring Provider (PT): Dr. Lindi Adie   Encounter Date: 10/22/2021   PT End of Session - 10/22/21 0952     Visit Number 1    Number of Visits 2    Date for PT Re-Evaluation 03/04/22    Authorization Type [employer]    Authorization Time Period 20 weeks    Authorization - Number of Visits 2    Progress Note Due on Visit 2    PT Start Time 0908    PT Stop Time 0949    PT Time Calculation (min) 41 min    Activity Tolerance Patient tolerated treatment well    Behavior During Therapy [facility] for tasks assessed/performed             Past Medical History:  Diagnosis Date   Abscess     History reviewed. No pertinent surgical history.  There were no vitals filed for this visit.    Subjective Assessment - 10/22/21 0911     Subjective Pt is here for pre-surgical screening.  She will have neoadjuvant chemo for 12 weeks but is not sure when it will start, and will have radiation following surgery. She is considering a double mastectomy .    Pertinent History Pt learned she had right breast Cancer on Oct 13, 2021 after having biopsies performed. Pt had found the lump herself.  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 No/denies                [facility] PT Assessment - 10/22/21 0001       Assessment   Medical Diagnosis Right breast Cancer    Referring Provider (PT) Dr. Lindi Adie    Onset Date/Surgical Date 10/13/21    Hand Dominance Left    Prior Therapy no      Precautions   Precaution Comments active CA      Restrictions   Weight  Bearing Restrictions No      Balance Screen   Has the patient fallen in the past 6 months No    Has the patient had a decrease in activity level because of a fear of falling?  No    Is the patient reluctant to leave their home because of a fear of falling?  No      Home Environment   Living Environment Private residence    Living Arrangements Spouse/significant other;Children    Available Help at Discharge Family    Type of [place]      Prior Function   Level of [place] Other (comment)    Vocation Requirements was working at home but stopped with her dx    Leisure taking care of children      Cognition   Overall Cognitive Status Within Functional Limits for tasks assessed      Posture/Postural Control   Posture/Postural Control Postural limitations    Postural Limitations Rounded Shoulders;Forward head      AROM   Right Shoulder Extension 60 Degrees    Right Shoulder Flexion 163 Degrees    Right  Shoulder ABduction 180 Degrees    Right Shoulder Internal Rotation 78 Degrees    Right Shoulder External Rotation 110 Degrees    Left Shoulder Extension 65 Degrees    Left Shoulder Flexion 163 Degrees    Left Shoulder ABduction 180 Degrees    Left Shoulder Internal Rotation 85 Degrees    Left Shoulder External Rotation 110 Degrees      Strength   Overall Strength Comments WFL               LYMPHEDEMA/ONCOLOGY QUESTIONNAIRE - 10/22/21 0001       Treatment   Active Chemotherapy Treatment --   will be starting   Past Chemotherapy Treatment No    Active Radiation Treatment No    Past Radiation Treatment No    Current Hormone Treatment No    Past Hormone Therapy No      What other symptoms do you have   Are you Having Heaviness or Tightness No    Are you having Pain No    Are you having pitting edema No    Is it Hard or Difficult finding clothes that fit No    Do you have infections No    Is there Decreased scar mobility No     [place] Sign No      Right Upper Extremity Lymphedema   15 cm Proximal to Olecranon Process 26 cm    10 cm Proximal to Olecranon Process 25 cm    Olecranon Process 21.7 cm    15 cm Proximal to Ulnar Styloid Process 20.6 cm    Just Proximal to Ulnar Styloid Process 14.5 cm    At Base of 2nd Digit 5.6 cm      Left Upper Extremity Lymphedema   15 cm Proximal to Olecranon Process 26.5 cm    10 cm Proximal to Olecranon Process 25 cm    Olecranon Process 22.3 cm    15 cm Proximal to Ulnar Styloid Process 21.3 cm    Just Proximal to Ulnar Styloid Process 14.4 cm    At Base of 2nd Digit 5.6 cm             L-DEX FLOWSHEETS - 10/22/21 0900       L-DEX LYMPHEDEMA SCREENING   Measurement Type Unilateral    L-DEX MEASUREMENT EXTREMITY Upper Extremity    POSITION  Standing    DOMINANT SIDE Left    At Risk Side Right    BASELINE SCORE (UNILATERAL) 4.3                    Objective measurements completed on examination: See above findings.                PT Education - 10/22/21 [phone]     Education Details ABC class, Lymphedema, 4 post op exercises, SOZO screens    Person(s) Educated Patient    Methods Explanation;Handout    Comprehension Verbalized understanding                 PT Long Term Goals - 10/22/21 1001       PT LONG TERM GOAL #1   Title Pt will restore shoulder ROM and function withinn 5-10 degrees of baseline measures    Time 20    Period Weeks    Status New    Target Date 03/04/22             Breast Clinic Goals - 10/22/21 1000  Patient will be able to verbalize understanding of pertinent lymphedema risk reduction practices relevant to her diagnosis specifically related to skin care.   Time 1    Period Days    Status Achieved    Target Date 10/22/21      Patient will be able to return demonstrate and/or verbalize understanding of the post-op home exercise program related to regaining shoulder range of motion.   Time  1    Period Weeks    Status Achieved    Target Date 10/22/21      Patient will be able to verbalize understanding of the importance of attending the postoperative After Breast Cancer Class for further lymphedema risk reduction education and therapeutic exercise.   Time 1    Period Days    Status Achieved    Target Date 10/22/21                   Plan - 10/22/21 0955     Clinical Impression Statement Pt is seen for a pre-surgical screen and to get information from provider for her newly diagnosed Right breast Cancer.  Baseline measures were taken for shoulder ROM, circumference measures, and a SOZO screen was performed. She was educated in our ABC class, Sozo screens every 3 months for 2 years after surgery, lymphedema precautions, and 4 post op exercises. Follow up appts were not made at this time as pt is having neoadjuvant chemo and does not have a surgery date yet.    Personal Factors and Comorbidities Comorbidity 1    Comorbidities active CA    Stability/Clinical Decision Making Stable/Uncomplicated    Clinical Decision Making Low    Rehab Potential Excellent    PT Frequency 1x / week   1 x visit in 20 weeks for post op reassessment and determining further PT needs   PT Treatment/Interventions Therapeutic exercise;Patient/family education    PT Next Visit Plan Reassess after surgery, schedule ABC, SOZO    Recommended Other Services ABC, sozo screens every 3 months after surgery for 2 years    Consulted and Agree with Plan of Care Patient           Patient will follow up at outpatient cancer rehab 3-4 weeks following surgery.  If the patient requires physical therapy at that time, a specific plan will be dictated and sent to the referring physician for approval. The patient was educated today on appropriate basic range of motion exercises to begin post operatively and the importance of attending the After Breast Cancer class following surgery.  Patient was educated today  on lymphedema risk reduction practices as it pertains to recommendations that will benefit the patient immediately following surgery.  She verbalized good understanding.   The patient was assessed using the L-Dex machine today to produce a lymphedema index baseline score. The patient will be reassessed on a regular basis (typically every 3 months) to obtain new L-Dex scores. If the score is > 6.5 points away from his/her baseline score indicating onset of subclinical lymphedema, it will be recommended to wear a compression garment for 4 weeks, 12 hours per day and then be reassessed. If the score continues to be > 6.5 points from baseline at reassessment, we will initiate lymphedema treatment. Assessing in this manner has a 95% rate of preventing clinically significant lymphedema.   Patient will benefit from skilled therapeutic intervention in order to improve the following deficits and impairments:  Decreased knowledge of precautions, Postural dysfunction  Visit Diagnosis: Malignant neoplasm  of upper-outer quadrant of right breast in female, estrogen receptor positive ([place])  Abnormal posture     Problem List Patient Active Problem List   Diagnosis Date Noted   Malignant neoplasm of upper-outer quadrant of right breast in female, estrogen receptor positive ([place]) 10/16/2021    Claris Pong, PT 10/22/2021, 11:59 AM  [facility] @ [address] [place], [place], [address] Phone: [phone]   Fax:  [phone]  Name: Shakita Keir MRN: [id number] Date of Birth: 1996-06-28

## 2021-10-22 NOTE — Telephone Encounter (Signed)
Left message for patient to return call in reference to upcoming appointments for Echo and chemo education class

## 2021-10-22 NOTE — Patient Instructions (Signed)
Physical Therapy Information for After Breast Cancer Surgery/Treatment:  Lymphedema is a swelling condition that you may be at risk for in your arm if you have lymph nodes removed from the armpit area.  After a sentinel node biopsy, the risk is approximately 5-9% and is higher after an axillary node dissection.  There is treatment available for this condition and it is not life-threatening.  Contact your physician or physical therapist with concerns. You may begin the 4 shoulder/posture exercises (see additional sheet) when permitted by your physician (typically a week after surgery).  If you have drains, you may need to wait until those are removed before beginning range of motion exercises.  A general recommendation is to not lift your arms above shoulder height until drains are removed.  These exercises should be done to your tolerance and gently.  This is not a "no pain/no gain" type of recovery so listen to your body and stretch into the range of motion that you can tolerate, stopping if you have pain.  If you are having immediate reconstruction, ask your plastic surgeon about doing exercises as he or she may want you to wait. We encourage you to attend the free one time ABC (After Breast Cancer) class offered by [facility].  You will learn information related to lymphedema risk, prevention and treatment and additional exercises to regain mobility following surgery.  You can call [phone] for more information.  This is offered the 1st and 3rd Monday of each month.  You only attend the class one time. While undergoing any medical procedure or treatment, try to avoid blood pressure being taken or needle sticks from occurring on the arm on the side of cancer.   This recommendation begins after surgery and continues for the rest of your life.  This may help reduce your risk of getting lymphedema (swelling in your arm). An excellent resource for those seeking information on  lymphedema is the National Lymphedema Network's web site. It can be accessed at [employer] If you notice swelling in your hand, arm or breast at any time following surgery (even if it is many years from now), please contact your doctor or physical therapist to discuss this.  Lymphedema can be treated at any time but it is easier for you if it is treated early on.  If you feel like your shoulder motion is not returning to normal in a reasonable amount of time, please contact your surgeon or physical therapist.  Monica [name]. [place], [place], [place] [phone]; [address] [place], [place] [address] ABC CLASS After Breast Cancer Class  After Breast Cancer Class is a specially designed exercise class to assist you in a safe recover after having breast cancer surgery.  In this class you will learn how to get back to full function whether your drains were just removed or if you had surgery a month ago.  This one-time class is held the 1st and 3rd Monday of every month from 11:00 a.m. until 12:00 noon at the [facility] located at [place], [place] [address]  This class is FREE and space is limited. For more information or to register for the next available class, call [phone].  Class Goals  Understand specific stretches to improve the flexibility of you chest and shoulder. Learn ways to safely strengthen your upper body and improve your posture. Understand the warning signs of infection and why you may be at risk for an arm infection.  Learn about Lymphedema and prevention.  ** You do not attend this class until after surgery.  Drains must be removed to participate  Patient was instructed today in a home exercise program today for post op shoulder range of motion. These included active assist shoulder flexion in sitting,supine, scapular retraction, wall walking with shoulder abduction, and hands behind head external rotation in supine.  She was  encouraged to do these twice a day, holding 3 seconds and repeating 5 times when permitted by her physician.

## 2021-10-23 ENCOUNTER — Encounter: Payer: Self-pay | Admitting: *Deleted

## 2021-10-23 ENCOUNTER — Other Ambulatory Visit: Payer: Self-pay | Admitting: Hematology and Oncology

## 2021-10-23 ENCOUNTER — Other Ambulatory Visit: Payer: Self-pay | Admitting: *Deleted

## 2021-10-23 ENCOUNTER — Other Ambulatory Visit: Payer: Self-pay | Admitting: General Surgery

## 2021-10-23 DIAGNOSIS — R9389 Abnormal findings on diagnostic imaging of other specified body structures: Secondary | ICD-10-CM

## 2021-10-23 DIAGNOSIS — C50411 Malignant neoplasm of upper-outer quadrant of right female breast: Secondary | ICD-10-CM

## 2021-10-23 NOTE — Progress Notes (Signed)
Pharmacist Chemotherapy Monitoring - Initial Assessment    Anticipated start date: 10/28/21   The following has been reviewed per standard work regarding the patient's treatment regimen: The patient's diagnosis, treatment plan and drug doses, and organ/hematologic function Lab orders and baseline tests specific to treatment regimen  The treatment plan start date, drug sequencing, and pre-medications Prior authorization status  Patient's documented medication list, including drug-drug interaction screen and prescriptions for anti-emetics and supportive care specific to the treatment regimen The drug concentrations, fluid compatibility, administration routes, and timing of the medications to be used The patient's access for treatment and lifetime cumulative dose history, if applicable  The patient's medication allergies and previous infusion related reactions, if applicable   Changes made to treatment plan:  treatment plan date and possible urine preg lab ordered to monitor during chemo with AC/Taxol  Follow up needed:  Pending authorization for treatment     Kennith Center, Pharm.D., CPP 10/23/2021@12 :58 PM

## 2021-10-26 ENCOUNTER — Inpatient Hospital Stay: Payer: Medicaid Other | Attending: Hematology and Oncology

## 2021-10-26 ENCOUNTER — Encounter (HOSPITAL_COMMUNITY): Payer: Self-pay | Admitting: General Surgery

## 2021-10-26 ENCOUNTER — Other Ambulatory Visit: Payer: Self-pay

## 2021-10-26 ENCOUNTER — Other Ambulatory Visit: Payer: Self-pay | Admitting: General Surgery

## 2021-10-26 ENCOUNTER — Encounter: Payer: Self-pay | Admitting: *Deleted

## 2021-10-26 ENCOUNTER — Ambulatory Visit (HOSPITAL_COMMUNITY)
Admission: RE | Admit: 2021-10-26 | Discharge: 2021-10-26 | Disposition: A | Discharge status: Home / Self Care | Payer: Medicaid Other | Source: Ambulatory Visit | Attending: Hematology and Oncology | Admitting: Hematology and Oncology

## 2021-10-26 ENCOUNTER — Telehealth: Payer: Self-pay | Admitting: Hematology and Oncology

## 2021-10-26 ENCOUNTER — Other Ambulatory Visit (HOSPITAL_COMMUNITY): Payer: Self-pay | Admitting: General Surgery

## 2021-10-26 DIAGNOSIS — C50411 Malignant neoplasm of upper-outer quadrant of right female breast: Secondary | ICD-10-CM | POA: Insufficient documentation

## 2021-10-26 DIAGNOSIS — Z79899 Other long term (current) drug therapy: Secondary | ICD-10-CM | POA: Insufficient documentation

## 2021-10-26 DIAGNOSIS — Z01818 Encounter for other preprocedural examination: Secondary | ICD-10-CM | POA: Diagnosis present

## 2021-10-26 DIAGNOSIS — Z170 Estrogen receptor positive status [ER+]: Secondary | ICD-10-CM

## 2021-10-26 DIAGNOSIS — M898X9 Other specified disorders of bone, unspecified site: Secondary | ICD-10-CM | POA: Insufficient documentation

## 2021-10-26 DIAGNOSIS — Z5111 Encounter for antineoplastic chemotherapy: Secondary | ICD-10-CM | POA: Insufficient documentation

## 2021-10-26 DIAGNOSIS — T451X5A Adverse effect of antineoplastic and immunosuppressive drugs, initial encounter: Secondary | ICD-10-CM | POA: Insufficient documentation

## 2021-10-26 DIAGNOSIS — Z0189 Encounter for other specified special examinations: Secondary | ICD-10-CM

## 2021-10-26 DIAGNOSIS — D701 Agranulocytosis secondary to cancer chemotherapy: Secondary | ICD-10-CM | POA: Insufficient documentation

## 2021-10-26 DIAGNOSIS — D6959 Other secondary thrombocytopenia: Secondary | ICD-10-CM | POA: Insufficient documentation

## 2021-10-26 LAB — ECHOCARDIOGRAM COMPLETE
Area-P 1/2: 4.39 cm2
Height: 66 in
S' Lateral: 3.3 cm
Weight: 2176 oz

## 2021-10-26 NOTE — Progress Notes (Signed)
Radiation Oncology         [phone] ________________________________  Name: Monica [name]        MRN: [id number]  Date of Service: 10/28/2021 DOB: 1996/07/29  SW:FUXNATF, Provider, MD  [name], MD     REFERRING PHYSICIAN: [name], MD   DIAGNOSIS: The encounter diagnosis was Malignant neoplasm of upper-outer quadrant of right breast in female, estrogen receptor positive ([place]).   HISTORY OF PRESENT ILLNESS: Monica [name] is a 25 y.o. female seen at the request of Dr. Lindi Adie for new diagnosis of right breast cancer.  The patient had a palpable mass noted and a family history of her grandmother who had breast cancer.  Diagnostic imaging showed a right breast mass with nipple retraction in the 11 to 2 o'clock position measuring up to 5.1 cm.  There was an additional mass in the 10 o'clock position but measurements were not discussed in conference.  An abnormal right axillary lymph node was identified.  There were also additional masses in the 830 and 11:00 positions.  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  At 10:00 nodal disease was also confirmed and her tumor was ER/PR positive HER2 negative with a KI 67 of 30 to 60%.  Her case was discussed and breast oncology conference and it was recommended that she undergo MRI for extent of disease followed by neoadjuvant chemotherapy.  She is contemplating bilateral mastectomies and is seen to discuss adjuvant radiotherapy following chemo and surgical resection.  PREVIOUS RADIATION THERAPY: No   PAST MEDICAL HISTORY:  Past Medical History:  Diagnosis Date   Abscess    Cancer ([place]) 10/16/2021   right breast cancer       PAST SURGICAL HISTORY:No past surgical history on file.   FAMILY HISTORY:  Family History  Problem Relation Age of Onset   Breast cancer Maternal Grandmother 86     SOCIAL HISTORY:  reports that she has never smoked. She has never used smokeless  tobacco. She reports that she does not drink alcohol and does not use drugs.  The patient is married and lives in [place].  She has 2 children and stays at home with one of them.    ALLERGIES: Patient has no known allergies.   MEDICATIONS:  Current Outpatient Medications  Medication Sig Dispense Refill   dexamethasone (DECADRON) 4 MG tablet Take 1 tablet (4 mg total) by mouth daily. Take 1 tablet day after chemo and 1 tablet 2 days after chemo with food 8 tablet 0   lidocaine-prilocaine (EMLA) cream Apply to affected area once 30 g 3   LORazepam (ATIVAN) 0.5 MG tablet Take 1 tablet (0.5 mg total) by mouth at bedtime as needed for sleep. 30 tablet 0   ondansetron (ZOFRAN) 8 MG tablet Take 1 tablet (8 mg total) by mouth 2 (two) times daily as needed. Start on the third day after chemotherapy. 30 tablet 1   prochlorperazine (COMPAZINE) 10 MG tablet Take 1 tablet (10 mg total) by mouth every 6 (six) hours as needed (Nausea or vomiting). 30 tablet 1   No current facility-administered medications for this visit.     REVIEW OF SYSTEMS: On review of systems, the patient reports that she is doing okay. She does not describe any breast pain but her PAC was placed yesterday and she is sore in the left chest from this.      PHYSICAL EXAM:  Wt Readings from Last 3 Encounters:  10/16/21 139 lb 8 oz (63.3 kg)  09/18/21 135 lb (61.2 kg)  05/30/19 125 lb (56.7 kg)   Temp Readings from Last 3 Encounters:  10/16/21 97.6 F (36.4 C) (Tympanic)  09/18/21 99.5 F (37.5 C) (Oral)  05/30/19 99.2 F (37.3 C) (Oral)   BP Readings from Last 3 Encounters:  10/16/21 124/80  09/18/21 119/77  05/30/19 112/75   Pulse Readings from Last 3 Encounters:  10/16/21 86  09/18/21 87  05/30/19 79    In general this is a well appearing African-American female in no acute distress. She's alert and oriented x4 and has a flat affect throughout the examination. Cardiopulmonary assessment is negative for acute  distress and she exhibits normal effort. Bilateral breast exam is deferred.    ECOG = 1  0 - Asymptomatic (Fully active, able to carry on all predisease activities without restriction)  1 - Symptomatic but completely ambulatory (Restricted in physically strenuous activity but ambulatory and able to carry out work of a light or sedentary nature. For example, light housework, office work)  2 - Symptomatic, <50% in bed during the day (Ambulatory and capable of all self care but unable to carry out any work activities. Up and about more than 50% of waking hours)  3 - Symptomatic, >50% in bed, but not bedbound (Capable of only limited self-care, confined to bed or chair 50% or more of waking hours)  4 - Bedbound (Completely disabled. Cannot carry on any self-care. Totally confined to bed or chair)  5 - Death   Eustace Pen MM, Creech RH, Tormey DC, et al. [phone]). "Toxicity and response criteria of the [facility]". [place] Oncol. 5 (6): 649-55    LABORATORY DATA:  No results found for: WBC, HGB, HCT, MCV, PLT No results found for: NA, K, CL, CO2 No results found for: ALT, AST, GGT, ALKPHOS, BILITOT    RADIOGRAPHY: MR BREAST BILATERAL W WO CONTRAST INC CAD  Result Date: 10/23/2021 CLINICAL DATA:  26 year old with recent biopsy-proven grade 2 invasive ductal carcinoma involving the RIGHT breast with metastatic disease to an intramammary lymph node in the UPPER OUTER QUADRANT and metastatic disease to a RIGHT axillary lymph node. MRI is performed to confirm extent of disease prior to definitive treatment, including neoadjuvant chemotherapy. EXAM: BILATERAL BREAST MRI WITH AND WITHOUT CONTRAST TECHNIQUE: Multiplanar, multisequence MR images of both breasts were obtained prior to and following the intravenous administration of 6 ml of Gadavist. Three-dimensional MR images were rendered by post-processing of the original MR data on an independent workstation. The  three-dimensional MR images were interpreted, and findings are reported in the following complete MRI report for this study. Three dimensional images were evaluated at the independent interpreting workstation using the DynaCAD thin client. COMPARISON:  No prior MRI. Recent mammography and RIGHT breast ultrasounds. FINDINGS: Breast composition: c. Heterogeneous fibroglandular tissue. Background parenchymal enhancement: Mild to moderate. RIGHT breast: The RIGHT breast is contracted and much smaller than the contralateral LEFT breast. Extensive non-mass enhancement throughout the [place] and an adjacent enhancing mass in the [place]. Blooming artifact from the biopsy marking clip is at the lateral edge of the non-mass enhancement at the near 12 o'clock location. The non-mass enhancement extends from the nipple posteriorly to the pectoralis muscle, though I see no convincing pectoralis involvement. There is no significant skin enhancement. The maximum extent of disease, including the mass and the non-mass enhancement, measures approximately 9.2 x 6.0 x 5.0 cm (AP x transverse  x craniocaudal). The mass in the UPPER OUTER QUADRANT measures approximately 1.2 x 2.0 x 1.3 cm. The extensive non-mass enhancement and the mass each demonstrate washout kinetics. Biopsy-proven metastatic intramammary lymph node in the outer breast associated with blooming artifact from the tissue marking clip. LEFT breast: Non-mass enhancement in the UPPER OUTER QUADRANT at middle to posterior depth measuring approximately 1.1 x 1.7 x 1.3 cm, demonstrating washout kinetics. No suspicious mass or abnormal enhancement elsewhere. Lymph nodes: 2 pathologic RIGHT axillary lymph nodes, including the biopsy-proven metastatic lymph node. No pathologic lymphadenopathy elsewhere. Ancillary findings:  None. IMPRESSION: 1. Extensive non-mass enhancement involving the UPPER INNER QUADRANT of the contracted RIGHT breast. Enhancing mass  in the UPPER OUTER QUADRANT of the RIGHT breast. The biopsy marking clip is at the lateral edge of the non-mass enhancement at the 12 o'clock position. Measurements are given above. 2. Biopsy-proven metastatic intramammary lymph node in the outer RIGHT breast. 3. Two pathologic RIGHT axillary lymph nodes, including the biopsy-proven metastatic node. 4. Indeterminate non-mass enhancement involving the UPPER OUTER QUADRANT of the LEFT breast. RECOMMENDATION: MRI guided biopsy of the non-mass enhancement in the LEFT breast. BI-RADS CATEGORY  4: Suspicious. Electronically Signed   By: Evangeline Dakin M.D.   On: 10/23/2021 09:38   US BREAST LTD UNI RIGHT INC AXILLA  Result Date: 10/02/2021 CLINICAL DATA:  [age] year with a large palpable lump and worsening deformity of the right breast and right nipple. EXAM: ULTRASOUND RIGHT BREAST LIMITED; DIGITAL DIAGNOSTIC BILATERAL MAMMOGRAM WITH TOMOSYNTHESIS AND CAD TECHNIQUE: Targeted ultrasound examination of the right breast was performed; Bilateral digital diagnostic mammography and breast tomosynthesis was performed. The images were evaluated with computer-aided detection. COMPARISON:  None. ACR Breast Density Category c: The breast tissue is heterogeneously dense, which may obscure small masses. FINDINGS: A radiopaque BB was placed at the site of the patient's palpable lumps within the right breast. Increased density with diffuse retraction is demonstrated involving the entire central right breast within all 4 quadrants. Note is made of a circumscribed mass in the upper outer quadrant, likely representing an enlarged intramammary lymph node. No definite focal or suspicious findings on the left. Further evaluation with ultrasound was performed on the right. On physical exam, the entire right breast is retracted in decreased in size when compared to the left. There is additional retraction and dimpling of the right nipple. I palpate a large firm mass, encompassing  the entire upper and central right breast. Targeted ultrasound is performed, showing a large, irregular mass with associated shadowing and distortion encompassing the entire right breast from the 11 o'clock to 2 o'clock position. Exact measurements are difficult due to the size, but it measures at least 5.1 x 3.1 cm. There is associated vascularity. A slightly more focal, irregular component is noted at the 11 o'clock position 5 cm from the nipple, measuring 7 x 6 x 3 mm and at the 8:30 position 6 cm from the nipple, measuring 8 x 6 x 3 mm. There is a morphologically abnormal intramammary lymph node with cortical thickening up to 5 mm at the 10 o'clock position 8 cm from the nipple. Additionally, evaluation right axilla demonstrates a rounded, abnormal lymph node with complete hilar effacement. It measures 6 x 6 x 5 mm. No additional axillary lymphadenopathy noted. IMPRESSION: 1. Highly suspicious mass involving the entire central right breast measuring up to 5.1 cm. This corresponds with the patient's palpable lump and overall deformity of the breast. Recommendation is for ultrasound-guided biopsy. Additional more  focal areas are noted along the 11 o'clock and 8:30 axis. 2. Suspicious, morphologically normal intramammary lymph node at the 10 o'clock position 8 cm from the nipple. Recommendation is for ultrasound-guided biopsy. 3. Single suspicious right axillary lymph node demonstrating complete hilar effacement. Recommendation is for ultrasound-guided biopsy. 4. No mammographic evidence of malignancy on the left. RECOMMENDATION: 1. Three area ultrasound-guided biopsy of the palpable right breast mass, intramammary lymph node and axillary lymph node. 2. Pending pathology results, further evaluation with breast MRI is recommended given the extent of disease and multiplicity of findings. I have discussed the findings and recommendations with the patient. If applicable, a reminder letter will be sent to the patient  regarding the next appointment. BI-RADS CATEGORY  5: Highly suggestive of malignancy. Electronically Signed   By: Kristopher Oppenheim M.D.   On: 10/02/2021 16:22  MM DIAG BREAST TOMO BILATERAL  Result Date: 10/02/2021 CLINICAL DATA:  [age] year with a large palpable lump and worsening deformity of the right breast and right nipple. EXAM: ULTRASOUND RIGHT BREAST LIMITED; DIGITAL DIAGNOSTIC BILATERAL MAMMOGRAM WITH TOMOSYNTHESIS AND CAD TECHNIQUE: Targeted ultrasound examination of the right breast was performed; Bilateral digital diagnostic mammography and breast tomosynthesis was performed. The images were evaluated with computer-aided detection. COMPARISON:  None. ACR Breast Density Category c: The breast tissue is heterogeneously dense, which may obscure small masses. FINDINGS: A radiopaque BB was placed at the site of the patient's palpable lumps within the right breast. Increased density with diffuse retraction is demonstrated involving the entire central right breast within all 4 quadrants. Note is made of a circumscribed mass in the upper outer quadrant, likely representing an enlarged intramammary lymph node. No definite focal or suspicious findings on the left. Further evaluation with ultrasound was performed on the right. On physical exam, the entire right breast is retracted in decreased in size when compared to the left. There is additional retraction and dimpling of the right nipple. I palpate a large firm mass, encompassing the entire upper and central right breast. Targeted ultrasound is performed, showing a large, irregular mass with associated shadowing and distortion encompassing the entire right breast from the 11 o'clock to 2 o'clock position. Exact measurements are difficult due to the size, but it measures at least 5.1 x 3.1 cm. There is associated vascularity. A slightly more focal, irregular component is noted at the 11 o'clock position 5 cm from the nipple, measuring 7 x 6 x 3 mm and at  the 8:30 position 6 cm from the nipple, measuring 8 x 6 x 3 mm. There is a morphologically abnormal intramammary lymph node with cortical thickening up to 5 mm at the 10 o'clock position 8 cm from the nipple. Additionally, evaluation right axilla demonstrates a rounded, abnormal lymph node with complete hilar effacement. It measures 6 x 6 x 5 mm. No additional axillary lymphadenopathy noted. IMPRESSION: 1. Highly suspicious mass involving the entire central right breast measuring up to 5.1 cm. This corresponds with the patient's palpable lump and overall deformity of the breast. Recommendation is for ultrasound-guided biopsy. Additional more focal areas are noted along the 11 o'clock and 8:30 axis. 2. Suspicious, morphologically normal intramammary lymph node at the 10 o'clock position 8 cm from the nipple. Recommendation is for ultrasound-guided biopsy. 3. Single suspicious right axillary lymph node demonstrating complete hilar effacement. Recommendation is for ultrasound-guided biopsy. 4. No mammographic evidence of malignancy on the left. RECOMMENDATION: 1. Three area ultrasound-guided biopsy of the palpable right breast mass, intramammary lymph node and  axillary lymph node. 2. Pending pathology results, further evaluation with breast MRI is recommended given the extent of disease and multiplicity of findings. I have discussed the findings and recommendations with the patient. If applicable, a reminder letter will be sent to the patient regarding the next appointment. BI-RADS CATEGORY  5: Highly suggestive of malignancy. Electronically Signed   By: Kristopher Oppenheim M.D.   On: 10/02/2021 16:22  [place] AXILLARY NODE CORE BIOPSY RIGHT  Addendum Date: 10/14/2021   ADDENDUM REPORT: 10/14/2021 16:15 ADDENDUM: Pathology revealed GRADE II INVASIVE MAMMARY CARCINOMA of the RIGHT breast, 12 o'clock, 4cmfn, (coil clip). E-cadherin is POSITIVE supporting a ductal origin. This was found to be concordant by Dr. Lovey Newcomer.  Pathology revealed INTRAMAMMARY LYMPH NODE WITH CARCINOMA of the RIGHT breast, 10 o'clock, 8cmfn, (ribbon clip). This was found to be concordant by Dr. Lovey Newcomer. Pathology revealed METASTATIC CARCINOMA INVOLVING NODAL TISSUE of the RIGHT axilla, (heart clip). E-cadherin is POSITIVE supporting a ductal origin. This was found to be concordant by Dr. Lovey Newcomer. Pathology results were discussed with the patient by telephone. The patient reported doing well after the biopsies with tenderness and bruising at the sites. Post biopsy instructions and care were reviewed and questions were answered. The patient was encouraged to call [facility] for any additional concerns. My direct phone number was provided. Medical oncology consultation has been arranged with Dr. [name] at [facility] on October 16, 2021. Surgical consultation has been arranged with Dr. Stark Klein at [facility] on October 20, 2021. Recommendation for a bilateral breast MRI to exclude any additional sites of disease given multiplicity of findings, age, heterogeneously dense breast tissue and family history. Pathology results reported by Terie Purser, RN on 10/14/2021. Electronically Signed   By: Lovey Newcomer M.D.   On: 10/14/2021 16:15   Result Date: 10/14/2021 CLINICAL DATA:  Patient with suspicious large right breast mass, right axillary lymph node and intramammary lymph node. EXAM: ULTRASOUND GUIDED RIGHT BREAST CORE NEEDLE BIOPSY COMPARISON:  Previous exam(s). PROCEDURE: I met with the patient and we discussed the procedure of ultrasound-guided biopsy, including benefits and alternatives. We discussed the high likelihood of a successful procedure. We discussed the risks of the procedure, including infection, bleeding, tissue injury, clip migration, and inadequate sampling. Informed written consent was given. The usual time-out protocol was performed immediately prior to the procedure.  Right breast mass 12 o'clock position: Lesion quadrant: Upper outer quadrant Using sterile technique and 1% Lidocaine as local anesthetic, under direct ultrasound visualization, a 14 gauge spring-loaded device was used to perform biopsy of right breast mass 12 o'clock position using a lateral approach. At the conclusion of the procedure coil tissue marker clip was deployed into the biopsy cavity. Follow up 2 view mammogram was performed and dictated separately. Right breast intramammary lymph node 10 o'clock position: Lesion quadrant: Upper outer quadrant Using sterile technique and 1% Lidocaine as local anesthetic, under direct ultrasound visualization, a 14 gauge spring-loaded device was used to perform biopsy of right breast intramammary lymph node 10 o'clock position using a lateral approach. At the conclusion of the procedure ribbon tissue marker clip was deployed into the biopsy cavity. Follow up 2 view mammogram was performed and dictated separately. Low right axillary lymph node: Lesion quadrant: Upper outer quadrant Using sterile technique and 1% Lidocaine as local anesthetic, under direct ultrasound visualization, a 14 gauge spring-loaded device was used to perform biopsy of low right axillary lymph node  using a lateral approach. At the conclusion of the procedure heart tissue marker clip was deployed into the biopsy cavity. Follow up 2 view mammogram was performed and dictated separately. IMPRESSION: Ultrasound guided biopsy of right breast mass, right breast intramammary node and low right axillary node. No apparent complications. Electronically Signed: By: Lovey Newcomer M.D. On: 10/08/2021 09:48  MM CLIP PLACEMENT RIGHT  Result Date: 10/08/2021 CLINICAL DATA:  Patient status post ultrasound-guided core needle biopsy right breast mass, intramammary node and axillary node. EXAM: 3D DIAGNOSTIC RIGHT MAMMOGRAM POST ULTRASOUND BIOPSY COMPARISON:  Previous exam(s). FINDINGS: 3D Mammographic images were  obtained following ultrasound guided biopsy of right breast. Site 1: Right breast mass 12 o'clock position: Coil shaped clip: In appropriate position. Site 2: Right breast mass 10 o'clock position: Ribbon shaped clip: In appropriate position. Site 3: Right axilla: Heart shaped clip: In appropriate position. IMPRESSION: Appropriate positioning of the biopsy marking clips as above Final Assessment: Post Procedure Mammograms for Marker Placement Electronically Signed   By: Lovey Newcomer M.D.   On: 10/08/2021 09:49  [place] RT BREAST BX W LOC DEV 1ST LESION IMG BX SPEC US GUIDE  Addendum Date: 10/14/2021   ADDENDUM REPORT: 10/14/2021 16:15 ADDENDUM: Pathology revealed GRADE II INVASIVE MAMMARY CARCINOMA of the RIGHT breast, 12 o'clock, 4cmfn, (coil clip). E-cadherin is POSITIVE supporting a ductal origin. This was found to be concordant by Dr. Lovey Newcomer. Pathology revealed INTRAMAMMARY LYMPH NODE WITH CARCINOMA of the RIGHT breast, 10 o'clock, 8cmfn, (ribbon clip). This was found to be concordant by Dr. Lovey Newcomer. Pathology revealed METASTATIC CARCINOMA INVOLVING NODAL TISSUE of the RIGHT axilla, (heart clip). E-cadherin is POSITIVE supporting a ductal origin. This was found to be concordant by Dr. Lovey Newcomer. Pathology results were discussed with the patient by telephone. The patient reported doing well after the biopsies with tenderness and bruising at the sites. Post biopsy instructions and care were reviewed and questions were answered. The patient was encouraged to call [facility] for any additional concerns. My direct phone number was provided. Medical oncology consultation has been arranged with Dr. [name] at [facility] on October 16, 2021. Surgical consultation has been arranged with Dr. Stark Klein at [facility] on October 20, 2021. Recommendation for a bilateral breast MRI to exclude any additional sites of disease given multiplicity of findings,  age, heterogeneously dense breast tissue and family history. Pathology results reported by Terie Purser, RN on 10/14/2021. Electronically Signed   By: Lovey Newcomer M.D.   On: 10/14/2021 16:15   Result Date: 10/14/2021 CLINICAL DATA:  Patient with suspicious large right breast mass, right axillary lymph node and intramammary lymph node. EXAM: ULTRASOUND GUIDED RIGHT BREAST CORE NEEDLE BIOPSY COMPARISON:  Previous exam(s). PROCEDURE: I met with the patient and we discussed the procedure of ultrasound-guided biopsy, including benefits and alternatives. We discussed the high likelihood of a successful procedure. We discussed the risks of the procedure, including infection, bleeding, tissue injury, clip migration, and inadequate sampling. Informed written consent was given. The usual time-out protocol was performed immediately prior to the procedure. Right breast mass 12 o'clock position: Lesion quadrant: Upper outer quadrant Using sterile technique and 1% Lidocaine as local anesthetic, under direct ultrasound visualization, a 14 gauge spring-loaded device was used to perform biopsy of right breast mass 12 o'clock position using a lateral approach. At the conclusion of the procedure coil tissue marker clip was deployed into the biopsy cavity. Follow up 2 view  mammogram was performed and dictated separately. Right breast intramammary lymph node 10 o'clock position: Lesion quadrant: Upper outer quadrant Using sterile technique and 1% Lidocaine as local anesthetic, under direct ultrasound visualization, a 14 gauge spring-loaded device was used to perform biopsy of right breast intramammary lymph node 10 o'clock position using a lateral approach. At the conclusion of the procedure ribbon tissue marker clip was deployed into the biopsy cavity. Follow up 2 view mammogram was performed and dictated separately. Low right axillary lymph node: Lesion quadrant: Upper outer quadrant Using sterile technique and 1% Lidocaine as  local anesthetic, under direct ultrasound visualization, a 14 gauge spring-loaded device was used to perform biopsy of low right axillary lymph node using a lateral approach. At the conclusion of the procedure heart tissue marker clip was deployed into the biopsy cavity. Follow up 2 view mammogram was performed and dictated separately. IMPRESSION: Ultrasound guided biopsy of right breast mass, right breast intramammary node and low right axillary node. No apparent complications. Electronically Signed: By: Lovey Newcomer M.D. On: 10/08/2021 09:48  [place] RT BREAST BX W LOC DEV EA ADD LESION IMG BX SPEC US GUIDE  Addendum Date: 10/14/2021   ADDENDUM REPORT: 10/14/2021 16:15 ADDENDUM: Pathology revealed GRADE II INVASIVE MAMMARY CARCINOMA of the RIGHT breast, 12 o'clock, 4cmfn, (coil clip). E-cadherin is POSITIVE supporting a ductal origin. This was found to be concordant by Dr. Lovey Newcomer. Pathology revealed INTRAMAMMARY LYMPH NODE WITH CARCINOMA of the RIGHT breast, 10 o'clock, 8cmfn, (ribbon clip). This was found to be concordant by Dr. Lovey Newcomer. Pathology revealed METASTATIC CARCINOMA INVOLVING NODAL TISSUE of the RIGHT axilla, (heart clip). E-cadherin is POSITIVE supporting a ductal origin. This was found to be concordant by Dr. Lovey Newcomer. Pathology results were discussed with the patient by telephone. The patient reported doing well after the biopsies with tenderness and bruising at the sites. Post biopsy instructions and care were reviewed and questions were answered. The patient was encouraged to call [facility] for any additional concerns. My direct phone number was provided. Medical oncology consultation has been arranged with Dr. [name] at [facility] on October 16, 2021. Surgical consultation has been arranged with Dr. Stark Klein at [facility] on October 20, 2021. Recommendation for a bilateral breast MRI to exclude any additional sites of  disease given multiplicity of findings, age, heterogeneously dense breast tissue and family history. Pathology results reported by Terie Purser, RN on 10/14/2021. Electronically Signed   By: Lovey Newcomer M.D.   On: 10/14/2021 16:15   Result Date: 10/14/2021 CLINICAL DATA:  Patient with suspicious large right breast mass, right axillary lymph node and intramammary lymph node. EXAM: ULTRASOUND GUIDED RIGHT BREAST CORE NEEDLE BIOPSY COMPARISON:  Previous exam(s). PROCEDURE: I met with the patient and we discussed the procedure of ultrasound-guided biopsy, including benefits and alternatives. We discussed the high likelihood of a successful procedure. We discussed the risks of the procedure, including infection, bleeding, tissue injury, clip migration, and inadequate sampling. Informed written consent was given. The usual time-out protocol was performed immediately prior to the procedure. Right breast mass 12 o'clock position: Lesion quadrant: Upper outer quadrant Using sterile technique and 1% Lidocaine as local anesthetic, under direct ultrasound visualization, a 14 gauge spring-loaded device was used to perform biopsy of right breast mass 12 o'clock position using a lateral approach. At the conclusion of the procedure coil tissue marker clip was deployed into the biopsy cavity. Follow up 2 view mammogram  was performed and dictated separately. Right breast intramammary lymph node 10 o'clock position: Lesion quadrant: Upper outer quadrant Using sterile technique and 1% Lidocaine as local anesthetic, under direct ultrasound visualization, a 14 gauge spring-loaded device was used to perform biopsy of right breast intramammary lymph node 10 o'clock position using a lateral approach. At the conclusion of the procedure ribbon tissue marker clip was deployed into the biopsy cavity. Follow up 2 view mammogram was performed and dictated separately. Low right axillary lymph node: Lesion quadrant: Upper outer quadrant Using  sterile technique and 1% Lidocaine as local anesthetic, under direct ultrasound visualization, a 14 gauge spring-loaded device was used to perform biopsy of low right axillary lymph node using a lateral approach. At the conclusion of the procedure heart tissue marker clip was deployed into the biopsy cavity. Follow up 2 view mammogram was performed and dictated separately. IMPRESSION: Ultrasound guided biopsy of right breast mass, right breast intramammary node and low right axillary node. No apparent complications. Electronically Signed: By: Lovey Newcomer M.D. On: 10/08/2021 09:48      IMPRESSION/PLAN: 1. Stage IIA cT3N1M0 grade 2, ER/PR positive invasive ductal carcinoma of the right breast. Dr. Lisbeth Renshaw discusses the pathology findings and reviews the nature of node positive right breast disease. The consensus from the breast conference includes neoadjuvant chemotherapy followed by interval surgical resection and likely targeted node dissection pending her MRI prechemo.  Dr. Lisbeth Renshaw discusses the rationale for external radiotherapy to the breast or chest wall but in either setting including regional lymph nodes to reduce risks of local recurrence followed by antiestrogen therapy. We discussed the risks, benefits, short, and long term effects of radiotherapy, as well as the curative intent, and the patient is interested in proceeding at the appropriate time. Dr. Lisbeth Renshaw discusses the delivery and logistics of radiotherapy and anticipates a course of 6 1/2 weeks of radiotherapy. We will see her back a few weeks after surgery to discuss the simulation process and anticipate we starting radiotherapy about 4-6 weeks after surgery.  2. Contraceptive counseling.  The patient has 2 children.  While she does not desire future pregnancies, she is aware of the need to avoid pregnancy in the midst of radiotherapy. She will need testing prior to radiotherapy unless her husband has a vasectomy. 3. Possible genetic predisposition  to malignancy. The patient is a candidate for genetic testing given her personal and family history. She was offered referral and would be interested.   In a visit lasting 60 minutes, greater than 50% of the time was spent face to face reviewing her case, as well as in preparation of, discussing, and coordinating the patient's care.  The above documentation reflects my direct findings during this shared patient visit. Please see the separate note by Dr. Lisbeth Renshaw on this date for the remainder of the patient's plan of care.    Carola Rhine, [facility]    **Disclaimer: This note was dictated with voice recognition software. Similar sounding words can inadvertently be transcribed and this note may contain transcription errors which may not have been corrected upon publication of note.**

## 2021-10-26 NOTE — Progress Notes (Signed)
PCP - [facility] Cardiologist - Denies Oncologist - Dr. [name]  PPM/ICD - Denies  Chest x-ray - 04/18/19 EKG - 04/18/19 Stress Test - denies ECHO - scheduled for 10/26/20 Cardiac Cath - denies  ERAS Protcol - Clears until 0430  COVID TEST- N/A Ambulatory surgery  Anesthesia review: N  Patient verbally denies any shortness of breath, fever, cough and chest pain during phone call   -------------  SDW INSTRUCTIONS given:  Your procedure is scheduled on 10/27/21.  Report to [facility] Main Entrance "A" at 0530 A.M., and check in at the Admitting office.  Call this number if you have problems the morning of surgery:  [phone]   Remember:  Do not eat after midnight the night before your surgery  You may drink clear liquids until 0430 the morning of your surgery.   Clear liquids allowed are: Water, Non-Citrus Juices (without pulp), Carbonated Beverages, Clear Tea, Black Coffee Only, and Gatorade    Take these medicines the morning of surgery with A SIP OF WATER  As of today, STOP taking any Aspirin (unless otherwise instructed by your surgeon) Aleve, Naproxen, Ibuprofen, Motrin, Advil, Goody's, BC's, all herbal medications, fish oil, and all vitamins.                      Do not wear jewelry, make up, or nail polish            Do not wear lotions, powders, perfumes/colognes, or deodorant.            Do not shave 48 hours prior to surgery.  Men may shave face and neck.            Do not bring valuables to the hospital.            [facility] is not responsible for any belongings or valuables.  Do NOT Smoke (Tobacco/Vaping) or drink Alcohol 24 hours prior to your procedure If you use a CPAP at night, you may bring all equipment for your overnight stay.   Contacts, glasses, dentures or bridgework may not be worn into surgery.      For patients admitted to the hospital, discharge time will be determined by your treatment team.   Patients discharged the day of  surgery will not be allowed to drive home, and someone needs to stay with them for 24 hours.    Special instructions:   [place]- Preparing For Surgery  Before surgery, you can play an important role. Because skin is not sterile, your skin needs to be as free of germs as possible. You can reduce the number of germs on your skin by washing with CHG (chlorahexidine gluconate) Soap before surgery.  CHG is an antiseptic cleaner which kills germs and bonds with the skin to continue killing germs even after washing.    Oral Hygiene is also important to reduce your risk of infection.  Remember - BRUSH YOUR TEETH THE MORNING OF SURGERY WITH YOUR REGULAR TOOTHPASTE  Please do not use if you have an allergy to CHG or antibacterial soaps. If your skin becomes reddened/irritated stop using the CHG.  Do not shave (including legs and underarms) for at least 48 hours prior to first CHG shower. It is OK to shave your face.  Please follow these instructions carefully.   Shower the NIGHT BEFORE SURGERY and the MORNING OF SURGERY with DIAL Soap.   Pat yourself dry with a CLEAN TOWEL.  Wear CLEAN PAJAMAS to  bed the night before surgery  Place CLEAN SHEETS on your bed the night of your first shower and DO NOT SLEEP WITH PETS.   Day of Surgery: Please shower morning of surgery  Wear Clean/Comfortable clothing the morning of surgery Do not apply any deodorants/lotions.   Remember to brush your teeth WITH YOUR REGULAR TOOTHPASTE.   Questions were answered. Patient verbalized understanding of instructions.

## 2021-10-26 NOTE — Progress Notes (Signed)
°  Echocardiogram 2D Echocardiogram has been performed.  Monica [name] 10/26/2021, 1:46 PM

## 2021-10-26 NOTE — Telephone Encounter (Signed)
Scheduled per scheduled message, patient has been called aND NOTIFIED OF UPCOMING APPOINTMENTS.

## 2021-10-27 ENCOUNTER — Other Ambulatory Visit (HOSPITAL_BASED_OUTPATIENT_CLINIC_OR_DEPARTMENT_OTHER): Payer: Self-pay

## 2021-10-27 ENCOUNTER — Encounter: Payer: Self-pay | Admitting: Hematology and Oncology

## 2021-10-27 ENCOUNTER — Encounter (HOSPITAL_COMMUNITY)
Admission: RE | Disposition: A | Discharge status: Home / Self Care | Payer: Self-pay | Source: Home / Self Care | Attending: General Surgery

## 2021-10-27 ENCOUNTER — Ambulatory Visit (HOSPITAL_COMMUNITY): Payer: Medicaid Other

## 2021-10-27 ENCOUNTER — Ambulatory Visit (HOSPITAL_COMMUNITY): Payer: Medicaid Other | Admitting: Certified Registered Nurse Anesthetist

## 2021-10-27 ENCOUNTER — Inpatient Hospital Stay: Payer: Medicaid Other

## 2021-10-27 ENCOUNTER — Encounter (HOSPITAL_COMMUNITY): Payer: Self-pay | Admitting: General Surgery

## 2021-10-27 ENCOUNTER — Other Ambulatory Visit: Payer: Self-pay

## 2021-10-27 ENCOUNTER — Inpatient Hospital Stay (HOSPITAL_BASED_OUTPATIENT_CLINIC_OR_DEPARTMENT_OTHER): Payer: Medicaid Other | Admitting: Hematology and Oncology

## 2021-10-27 ENCOUNTER — Ambulatory Visit (HOSPITAL_COMMUNITY)
Admission: RE | Admit: 2021-10-27 | Discharge: 2021-10-27 | Disposition: A | Discharge status: Home / Self Care | Payer: Medicaid Other | Attending: General Surgery | Admitting: General Surgery

## 2021-10-27 DIAGNOSIS — C50411 Malignant neoplasm of upper-outer quadrant of right female breast: Secondary | ICD-10-CM | POA: Diagnosis present

## 2021-10-27 DIAGNOSIS — Z79899 Other long term (current) drug therapy: Secondary | ICD-10-CM | POA: Diagnosis not present

## 2021-10-27 DIAGNOSIS — T451X5A Adverse effect of antineoplastic and immunosuppressive drugs, initial encounter: Secondary | ICD-10-CM | POA: Diagnosis not present

## 2021-10-27 DIAGNOSIS — Z803 Family history of malignant neoplasm of breast: Secondary | ICD-10-CM | POA: Diagnosis not present

## 2021-10-27 DIAGNOSIS — Z452 Encounter for adjustment and management of vascular access device: Secondary | ICD-10-CM | POA: Insufficient documentation

## 2021-10-27 DIAGNOSIS — Z170 Estrogen receptor positive status [ER+]: Secondary | ICD-10-CM

## 2021-10-27 DIAGNOSIS — M898X9 Other specified disorders of bone, unspecified site: Secondary | ICD-10-CM | POA: Diagnosis not present

## 2021-10-27 DIAGNOSIS — Z95828 Presence of other vascular implants and grafts: Secondary | ICD-10-CM

## 2021-10-27 DIAGNOSIS — D6959 Other secondary thrombocytopenia: Secondary | ICD-10-CM | POA: Diagnosis not present

## 2021-10-27 DIAGNOSIS — D701 Agranulocytosis secondary to cancer chemotherapy: Secondary | ICD-10-CM | POA: Diagnosis not present

## 2021-10-27 DIAGNOSIS — Z5111 Encounter for antineoplastic chemotherapy: Secondary | ICD-10-CM | POA: Diagnosis present

## 2021-10-27 HISTORY — PX: PORTACATH PLACEMENT: SHX2246

## 2021-10-27 LAB — CBC WITH DIFFERENTIAL (CANCER CENTER ONLY)
Abs Immature Granulocytes: 0.02 10*3/uL (ref 0.00–0.07)
Basophils Absolute: 0 10*3/uL (ref 0.0–0.1)
Basophils Relative: 1 %
Eosinophils Absolute: 0.1 10*3/uL (ref 0.0–0.5)
Eosinophils Relative: 2 %
HCT: 36 % (ref 36.0–46.0)
Hemoglobin: 12.2 g/dL (ref 12.0–15.0)
Immature Granulocytes: 0 %
Lymphocytes Relative: 47 %
Lymphs Abs: 2.5 10*3/uL (ref 0.7–4.0)
MCH: 31.2 pg (ref 26.0–34.0)
MCHC: 33.9 g/dL (ref 30.0–36.0)
MCV: 92.1 fL (ref 80.0–100.0)
Monocytes Absolute: 0.3 10*3/uL (ref 0.1–1.0)
Monocytes Relative: 5 %
Neutro Abs: 2.4 10*3/uL (ref 1.7–7.7)
Neutrophils Relative %: 45 %
Platelet Count: 192 10*3/uL (ref 150–400)
RBC: 3.91 MIL/uL (ref 3.87–5.11)
RDW: 11.3 % — ABNORMAL LOW (ref 11.5–15.5)
WBC Count: 5.4 10*3/uL (ref 4.0–10.5)
nRBC: 0 % (ref 0.0–0.2)

## 2021-10-27 LAB — CMP (CANCER CENTER ONLY)
ALT: 5 U/L (ref 0–44)
AST: 13 U/L — ABNORMAL LOW (ref 15–41)
Albumin: 4.3 g/dL (ref 3.5–5.0)
Alkaline Phosphatase: 31 U/L — ABNORMAL LOW (ref 38–126)
Anion gap: 6 (ref 5–15)
BUN: 13 mg/dL (ref 6–20)
CO2: 26 mmol/L (ref 22–32)
Calcium: 9.3 mg/dL (ref 8.9–10.3)
Chloride: 107 mmol/L (ref 98–111)
Creatinine: 0.84 mg/dL (ref 0.44–1.00)
GFR, Estimated: >60 mL/min (ref >60)
Glucose, Bld: 94 mg/dL (ref 70–99)
Potassium: 3.9 mmol/L (ref 3.5–5.1)
Sodium: 139 mmol/L (ref 135–145)
Total Bilirubin: 0.5 mg/dL (ref 0.3–1.2)
Total Protein: 6.7 g/dL (ref 6.5–8.1)

## 2021-10-27 LAB — POCT PREGNANCY, URINE: Preg Test, Ur: NEGATIVE

## 2021-10-27 IMAGING — DX DG CHEST 1V PORT
1 series · 1 of 1 positions shown · non-contrast
Comparison: [DATE]

CLINICAL DATA: Left chest port placement

EXAM:
PORTABLE CHEST 1 VIEW

[chest ap]
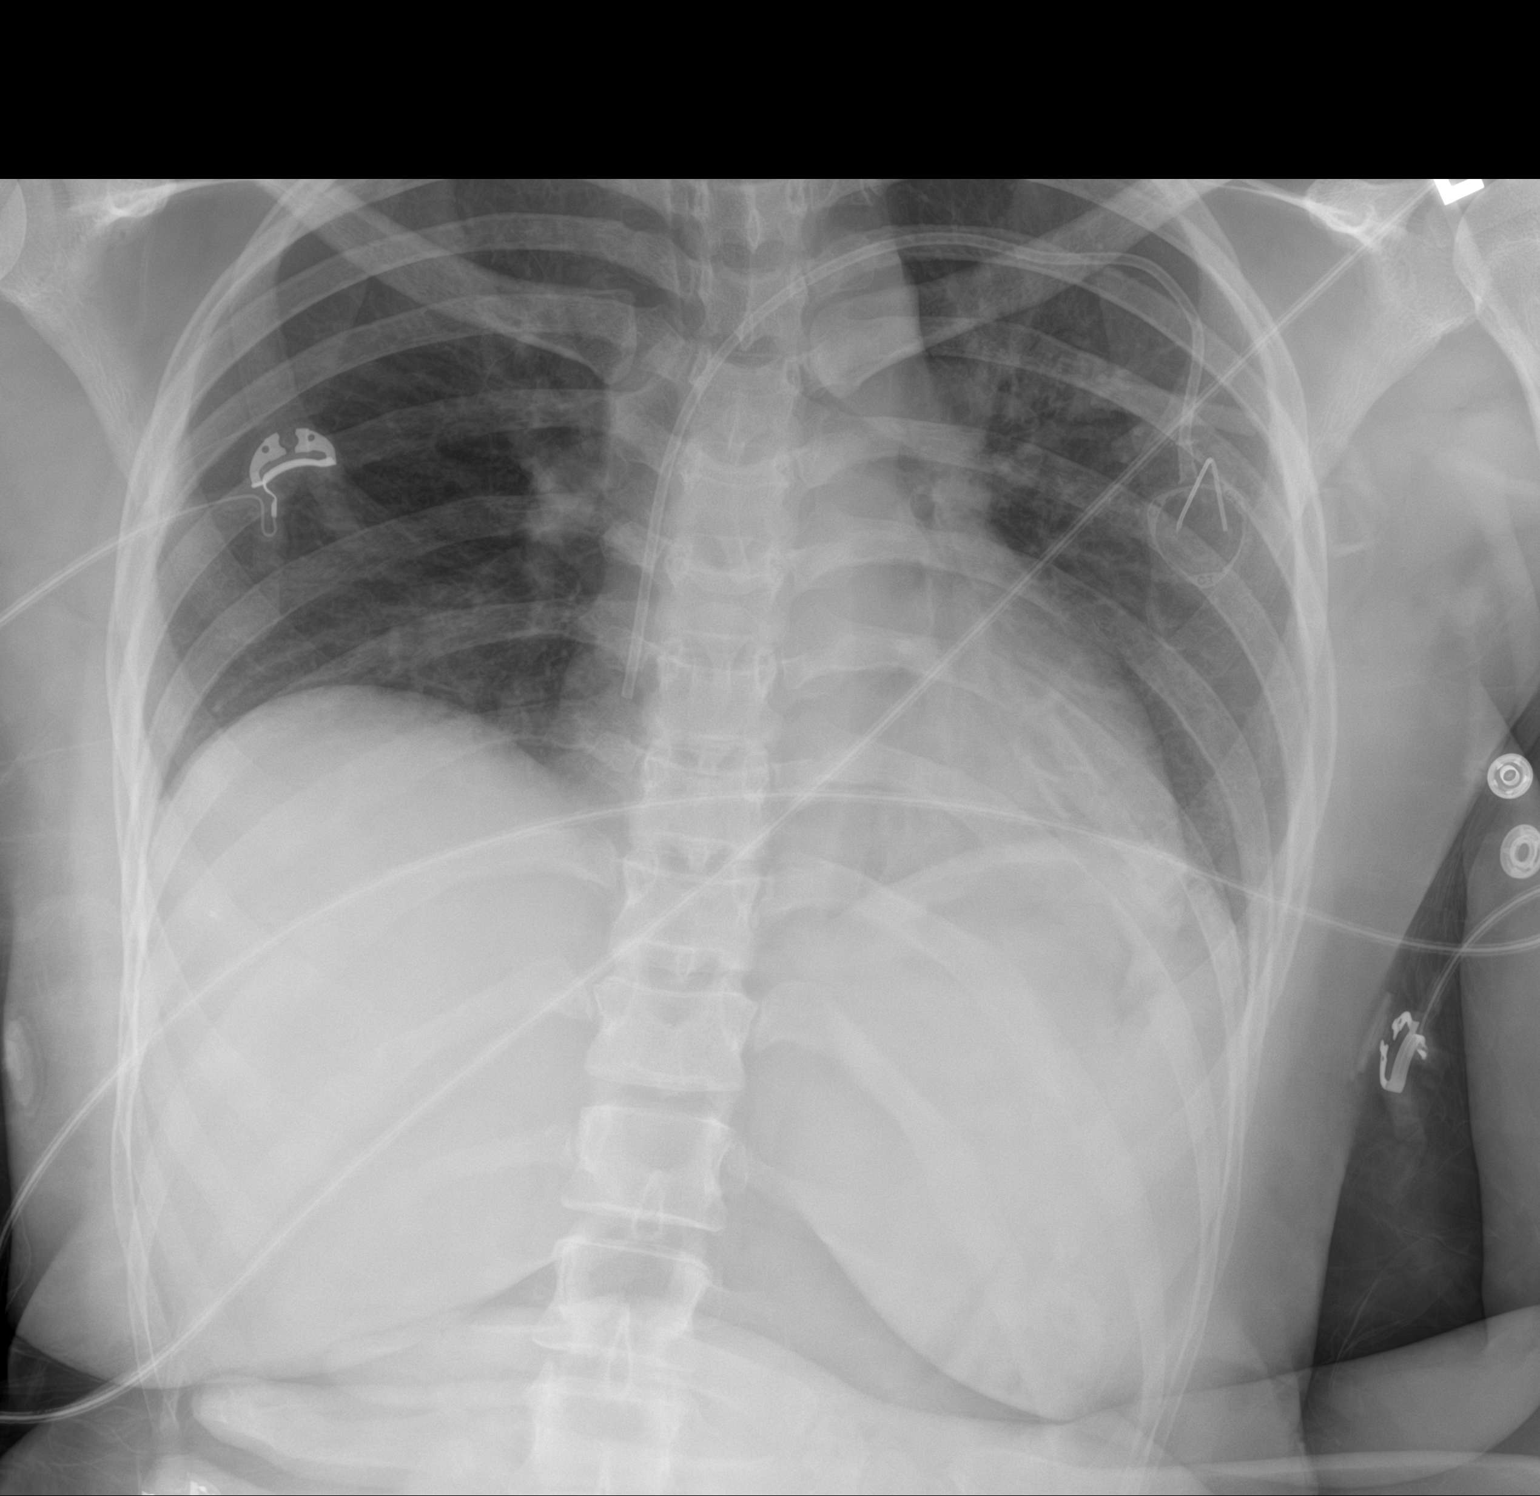

[1 of 1 positions shown; findings below may reference images not displayed]

FINDINGS: There is interval placement of left chest port with its tip in the
right atrium. There is poor inspiration. There are no signs of
alveolar pulmonary edema or focal pulmonary consolidation. There is
no pleural effusion or pneumothorax.
IMPRESSION: Tip of chest port is seen in the right atrium. There is no
pneumothorax.

## 2021-10-27 SURGERY — INSERTION, TUNNELED CENTRAL VENOUS DEVICE, WITH PORT
Anesthesia: General | Site: Breast | Laterality: Left

## 2021-10-27 MED ORDER — ORAL CARE MOUTH RINSE: 15.0000 mL | Freq: Once | OROMUCOSAL | Status: AC

## 2021-10-27 MED ORDER — MIDAZOLAM HCL 2 MG/2ML IJ SOLN
INTRAMUSCULAR | Status: DC | PRN
  Administered 2021-10-27: 2 mg via INTRAVENOUS

## 2021-10-27 MED ORDER — CHLORHEXIDINE GLUCONATE 0.12 % MT SOLN
OROMUCOSAL | Status: AC
  Administered 2021-10-27: 15 mL via OROMUCOSAL
  Filled 2021-10-27: qty 15

## 2021-10-27 MED ORDER — FENTANYL CITRATE (PF) 250 MCG/5ML IJ SOLN
INTRAMUSCULAR | Status: DC | PRN
  Administered 2021-10-27 (×2): 25 ug via INTRAVENOUS

## 2021-10-27 MED ORDER — ACETAMINOPHEN 500 MG PO TABS: 1000.0000 mg | ORAL_TABLET | ORAL | Status: AC

## 2021-10-27 MED ORDER — FENTANYL CITRATE (PF) 100 MCG/2ML IJ SOLN: 25.0000 ug | INTRAMUSCULAR | Status: DC | PRN

## 2021-10-27 MED ORDER — ACETAMINOPHEN 160 MG/5ML PO SOLN: 325.0000 mg | ORAL | Status: DC | PRN

## 2021-10-27 MED ORDER — ONDANSETRON HCL 4 MG/2ML IJ SOLN
INTRAMUSCULAR | Status: DC | PRN
  Administered 2021-10-27: 4 mg via INTRAVENOUS

## 2021-10-27 MED ORDER — ONDANSETRON HCL 4 MG/2ML IJ SOLN
INTRAMUSCULAR | Status: AC
  Filled 2021-10-27: qty 2

## 2021-10-27 MED ORDER — CHLORHEXIDINE GLUCONATE CLOTH 2 % EX PADS: 6.0000 | MEDICATED_PAD | Freq: Once | CUTANEOUS | Status: DC

## 2021-10-27 MED ORDER — ACETAMINOPHEN 325 MG PO TABS: 325.0000 mg | ORAL_TABLET | ORAL | Status: DC | PRN

## 2021-10-27 MED ORDER — BUPIVACAINE-EPINEPHRINE (PF) 0.25% -1:200000 IJ SOLN
INTRAMUSCULAR | Status: AC
  Filled 2021-10-27: qty 30

## 2021-10-27 MED ORDER — MIDAZOLAM HCL 2 MG/2ML IJ SOLN
INTRAMUSCULAR | Status: AC
  Filled 2021-10-27: qty 2

## 2021-10-27 MED ORDER — CHLORHEXIDINE GLUCONATE 0.12 % MT SOLN: 15.0000 mL | Freq: Once | OROMUCOSAL | Status: AC

## 2021-10-27 MED ORDER — ACETAMINOPHEN 10 MG/ML IV SOLN: 1000.0000 mg | Freq: Once | INTRAVENOUS | Status: DC | PRN

## 2021-10-27 MED ORDER — PROPOFOL 10 MG/ML IV BOLUS
INTRAVENOUS | Status: DC | PRN
  Administered 2021-10-27: 200 mg via INTRAVENOUS
  Administered 2021-10-27: 50 mg via INTRAVENOUS

## 2021-10-27 MED ORDER — PROMETHAZINE HCL 25 MG/ML IJ SOLN: 6.2500 mg | INTRAMUSCULAR | Status: DC | PRN

## 2021-10-27 MED ORDER — PROPOFOL 10 MG/ML IV BOLUS
INTRAVENOUS | Status: AC
  Filled 2021-10-27: qty 20

## 2021-10-27 MED ORDER — PHENYLEPHRINE 40 MCG/ML (10ML) SYRINGE FOR IV PUSH (FOR BLOOD PRESSURE SUPPORT)
PREFILLED_SYRINGE | INTRAVENOUS | Status: DC | PRN
  Administered 2021-10-27: 40 ug via INTRAVENOUS
  Administered 2021-10-27 (×2): 80 ug via INTRAVENOUS
  Administered 2021-10-27 (×2): 40 ug via INTRAVENOUS

## 2021-10-27 MED ORDER — HEPARIN SOD (PORK) LOCK FLUSH 100 UNIT/ML IV SOLN
INTRAVENOUS | Status: AC
  Filled 2021-10-27: qty 5

## 2021-10-27 MED ORDER — OXYCODONE HCL 5 MG/5ML PO SOLN
5.0000 mg | Freq: Once | ORAL | Status: DC | PRN
Start: 2021-10-27 — End: 2021-10-27

## 2021-10-27 MED ORDER — LIDOCAINE HCL (PF) 1 % IJ SOLN
INTRAMUSCULAR | Status: AC
  Filled 2021-10-27: qty 30

## 2021-10-27 MED ORDER — FENTANYL CITRATE (PF) 250 MCG/5ML IJ SOLN
INTRAMUSCULAR | Status: AC
  Filled 2021-10-27: qty 5

## 2021-10-27 MED ORDER — LIDOCAINE HCL 1 % IJ SOLN
INTRAMUSCULAR | Status: DC | PRN
  Administered 2021-10-27: 18 mL

## 2021-10-27 MED ORDER — 0.9 % SODIUM CHLORIDE (POUR BTL) OPTIME
TOPICAL | Status: DC | PRN
  Administered 2021-10-27: 500 mL

## 2021-10-27 MED ORDER — CEFAZOLIN SODIUM-DEXTROSE 2-4 GM/100ML-% IV SOLN
2.0000 g | INTRAVENOUS | Status: AC
  Administered 2021-10-27: 2 g via INTRAVENOUS

## 2021-10-27 MED ORDER — HEPARIN 6000 UNIT IRRIGATION SOLUTION
Status: AC
  Filled 2021-10-27: qty 500

## 2021-10-27 MED ORDER — ACETAMINOPHEN 500 MG PO TABS
ORAL_TABLET | ORAL | Status: AC
  Administered 2021-10-27: 1000 mg via ORAL
  Filled 2021-10-27: qty 2

## 2021-10-27 MED ORDER — AMISULPRIDE (ANTIEMETIC) 5 MG/2ML IV SOLN: 10.0000 mg | Freq: Once | INTRAVENOUS | Status: DC | PRN

## 2021-10-27 MED ORDER — SCOPOLAMINE 1 MG/3DAYS TD PT72
MEDICATED_PATCH | TRANSDERMAL | Status: DC | PRN
  Administered 2021-10-27: 1 via TRANSDERMAL

## 2021-10-27 MED ORDER — LIDOCAINE 2% (20 MG/ML) 5 ML SYRINGE
INTRAMUSCULAR | Status: DC | PRN
  Administered 2021-10-27: 40 mg via INTRAVENOUS

## 2021-10-27 MED ORDER — CEFAZOLIN SODIUM-DEXTROSE 2-4 GM/100ML-% IV SOLN
INTRAVENOUS | Status: AC
  Filled 2021-10-27: qty 100

## 2021-10-27 MED ORDER — SCOPOLAMINE 1 MG/3DAYS TD PT72
MEDICATED_PATCH | TRANSDERMAL | Status: AC
  Filled 2021-10-27: qty 1

## 2021-10-27 MED ORDER — OXYCODONE HCL 5 MG PO TABS
5.0000 mg | ORAL_TABLET | Freq: Four times a day (QID) | ORAL | 0 refills | Status: DC | PRN
  Filled 2021-10-27: qty 5, 2d supply, fill #0

## 2021-10-27 MED ORDER — PHENYLEPHRINE 40 MCG/ML (10ML) SYRINGE FOR IV PUSH (FOR BLOOD PRESSURE SUPPORT)
PREFILLED_SYRINGE | INTRAVENOUS | Status: AC
  Filled 2021-10-27: qty 10

## 2021-10-27 MED ORDER — LACTATED RINGERS IV SOLN: INTRAVENOUS | Status: DC

## 2021-10-27 MED ORDER — HEPARIN 6000 UNIT IRRIGATION SOLUTION
Status: DC | PRN
  Administered 2021-10-27: 1

## 2021-10-27 MED ORDER — OXYCODONE HCL 5 MG PO TABS: 5.0000 mg | ORAL_TABLET | Freq: Once | ORAL | Status: DC | PRN

## 2021-10-27 MED ORDER — LIDOCAINE 2% (20 MG/ML) 5 ML SYRINGE
INTRAMUSCULAR | Status: AC
  Filled 2021-10-27: qty 5

## 2021-10-27 MED ORDER — HEPARIN SOD (PORK) LOCK FLUSH 100 UNIT/ML IV SOLN
INTRAVENOUS | Status: DC | PRN
Start: 2021-10-27 — End: 2021-10-27
  Administered 2021-10-27: 500 [IU] via INTRAVENOUS

## 2021-10-27 SURGICAL SUPPLY — 43 items
ADH SKN CLS APL DERMABOND .7 (GAUZE/BANDAGES/DRESSINGS) ×1
APL PRP STRL LF DISP 70% ISPRP (MISCELLANEOUS) ×1
BAG COUNTER SPONGE SURGICOUNT (BAG) ×2 IMPLANT
BAG DECANTER FOR FLEXI CONT (MISCELLANEOUS) ×2 IMPLANT
BAG SPNG CNTER NS LX DISP (BAG) ×1
CANISTER SUCT 3000ML PPV (MISCELLANEOUS) ×1 IMPLANT
CHLORAPREP W/TINT 26 (MISCELLANEOUS) ×2 IMPLANT
COVER SURGICAL LIGHT HANDLE (MISCELLANEOUS) ×2 IMPLANT
DECANTER SPIKE VIAL GLASS SM (MISCELLANEOUS) ×4 IMPLANT
DERMABOND ADVANCED (GAUZE/BANDAGES/DRESSINGS) ×1
DERMABOND ADVANCED .7 DNX12 (GAUZE/BANDAGES/DRESSINGS) ×1 IMPLANT
DRAPE C-ARM 42X120 X-RAY (DRAPES) ×2 IMPLANT
DRAPE CHEST BREAST 15X10 FENES (DRAPES) ×2 IMPLANT
DRSG TEGADERM 4X4.75 (GAUZE/BANDAGES/DRESSINGS) ×3 IMPLANT
ELECT COATED BLADE 2.86 ST (ELECTRODE) ×2 IMPLANT
ELECT REM PT RETURN 9FT ADLT (ELECTROSURGICAL) ×2
ELECTRODE REM PT RTRN 9FT ADLT (ELECTROSURGICAL) ×1 IMPLANT
GAUZE 4X4 16PLY ~~LOC~~+RFID DBL (SPONGE) ×2 IMPLANT
GAUZE SPONGE 4X4 12PLY STRL (GAUZE/BANDAGES/DRESSINGS) ×1 IMPLANT
GLOVE SURG ENC MOIS LTX SZ6 (GLOVE) ×2 IMPLANT
GLOVE SURG UNDER LTX SZ6.5 (GLOVE) ×2 IMPLANT
GOWN STRL REUS W/ TWL LRG LVL3 (GOWN DISPOSABLE) ×1 IMPLANT
GOWN STRL REUS W/TWL 2XL LVL3 (GOWN DISPOSABLE) ×2 IMPLANT
GOWN STRL REUS W/TWL LRG LVL3 (GOWN DISPOSABLE) ×2
IV CONNECTOR ONE LINK NDLESS (IV SETS) ×2 IMPLANT
KIT BASIN OR (CUSTOM PROCEDURE TRAY) ×2 IMPLANT
KIT PORT POWER 8FR ISP CVUE (Port) ×1 IMPLANT
KIT TURNOVER KIT B (KITS) ×2 IMPLANT
NEEDLE 22X1 1/2 (OR ONLY) (NEEDLE) ×2 IMPLANT
NS IRRIG 1000ML POUR BTL (IV SOLUTION) ×2 IMPLANT
PAD ARMBOARD 7.5X6 YLW CONV (MISCELLANEOUS) ×2 IMPLANT
PENCIL BUTTON HOLSTER BLD 10FT (ELECTRODE) ×2 IMPLANT
POSITIONER HEAD DONUT 9IN (MISCELLANEOUS) ×2 IMPLANT
SUT MON AB 4-0 PC3 18 (SUTURE) ×2 IMPLANT
SUT PROLENE 2 0 SH DA (SUTURE) ×4 IMPLANT
SUT VIC AB 3-0 SH 27 (SUTURE) ×2
SUT VIC AB 3-0 SH 27X BRD (SUTURE) ×1 IMPLANT
SYR 5ML LUER SLIP (SYRINGE) ×2 IMPLANT
TOWEL GREEN STERILE (TOWEL DISPOSABLE) ×2 IMPLANT
TOWEL GREEN STERILE FF (TOWEL DISPOSABLE) ×2 IMPLANT
TRAY LAPAROSCOPIC MC (CUSTOM PROCEDURE TRAY) ×2 IMPLANT
TUBE CONNECTING 12X1/4 (SUCTIONS) ×1 IMPLANT
YANKAUER SUCT BULB TIP NO VENT (SUCTIONS) ×1 IMPLANT

## 2021-10-27 NOTE — Assessment & Plan Note (Signed)
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, FISH negative    Treatment plan: 1.  Neoadjuvant chemotherapy with dose dense Adriamycin and Cytoxan followed by Taxol weekly x12   2. Mastectomy versus lumpectomy with targeted node dissection 3.  Adjuvant radiation 4.  Followed by adjuvant antiestrogen therapy (ovarian suppression) along with CDK 4 and 6 inhibitor 5.  Genetic testing -------------------------------------------------------------------------------------------------------------  Current Treatment:  Cycle 1 Adriamycin and Cytoxan Labs reviewed, Chemo consent obtained RTC in 1 week for tox check:

## 2021-10-27 NOTE — Anesthesia Procedure Notes (Signed)
Procedure Name: LMA Insertion Date/Time: 10/27/2021 7:49 AM Performed by: Maude Leriche, CRNA Pre-anesthesia Checklist: Patient identified, Emergency Drugs available, Suction available and Patient being monitored Patient Re-evaluated:Patient Re-evaluated prior to induction Oxygen Delivery Method: Circle system utilized Preoxygenation: Pre-oxygenation with 100% oxygen Induction Type: IV induction LMA: LMA inserted LMA Size: 3.0 Number of attempts: 1 Tube secured with: Tape Dental Injury: Teeth and Oropharynx as per pre-operative assessment

## 2021-10-27 NOTE — Progress Notes (Signed)
Patient Care Team: Default, Provider, MD as PCP - General [name], RN as Oncology Nurse Navigator [name], RN as Oncology Nurse Navigator  DIAGNOSIS:  Encounter Diagnosis  Name Primary?   Malignant neoplasm of upper-outer quadrant of right breast in female, estrogen receptor positive ([place])     SUMMARY OF ONCOLOGIC HISTORY: Oncology History  Malignant neoplasm of upper-outer quadrant of right breast in female, estrogen receptor positive ([place])  10/16/2021 Initial Diagnosis   Malignant neoplasm of upper-outer quadrant of right breast in female, estrogen receptor positive ([place])   10/16/2021 Cancer Staging   Staging form: Breast, AJCC 8th Edition - Clinical stage from 10/16/2021: Stage IIA (cT3, cN1, cM0, G2, ER+, PR+, HER2: Equivocal) - Signed by [name], MD on 10/16/2021 Histologic grading system: 3 grade system    10/28/2021 -  Chemotherapy   Patient is on Treatment Plan : BREAST ADJUVANT DOSE DENSE AC q14d / PACLitaxel q7d       CHIEF COMPLIANT: Follow-up to discuss treatment plan  INTERVAL HISTORY: Monica [name] is a 26 year old above-mentioned history of ER/PR positive right breast cancer who is here today to discuss starting chemotherapy tomorrow.  She had port placement and is very sore from that.  She is concerned about lying on her belly for the MRI guided biopsies.  She is also complaining of bilateral lower extremity pain.  Dr. Is setting her up for a CT scan and bone scan we do not have the dates.  For these procedures    ALLERGIES:  has No Known Allergies.  MEDICATIONS:  Current Outpatient Medications  Medication Sig Dispense Refill   dexamethasone (DECADRON) 4 MG tablet Take 1 tablet (4 mg total) by mouth daily. Take 1 tablet day after chemo and 1 tablet 2 days after chemo with food 8 tablet 0   lidocaine-prilocaine (EMLA) cream Apply to affected area once 30 g 3   LORazepam (ATIVAN) 0.5 MG tablet Take 1 tablet (0.5 mg total) by mouth at  bedtime as needed for sleep. 30 tablet 0   ondansetron (ZOFRAN) 8 MG tablet Take 1 tablet (8 mg total) by mouth 2 (two) times daily as needed. Start on the third day after chemotherapy. 30 tablet 1   oxyCODONE (OXY IR/ROXICODONE) 5 MG immediate release tablet Take 1 tablet (5 mg total) by mouth every 6 (six) hours as needed for severe pain. 5 tablet 0   prochlorperazine (COMPAZINE) 10 MG tablet Take 1 tablet (10 mg total) by mouth every 6 (six) hours as needed (Nausea or vomiting). 30 tablet 1   No current facility-administered medications for this visit.    PHYSICAL EXAMINATION: ECOG PERFORMANCE STATUS: 1 - Symptomatic but completely ambulatory  Vitals:   10/27/21 1136  BP: 116/86  Pulse: 69  Resp: 18  Temp: 97.7 F (36.5 C)  SpO2: 100%   Filed Weights   10/27/21 1136  Weight: 139 lb 3.2 oz (63.1 kg)      LABORATORY DATA:  I have reviewed the data as listed CMP Latest Ref Rng & Units 10/27/2021  Glucose 70 - 99 mg/dL 94  BUN 6 - 20 mg/dL 13  Creatinine 0.44 - 1.00 mg/dL 0.84  Sodium 135 - 145 mmol/L 139  Potassium 3.5 - 5.1 mmol/L 3.9  Chloride 98 - 111 mmol/L 107  CO2 22 - 32 mmol/L 26  Calcium 8.9 - 10.3 mg/dL 9.3  Total Protein 6.5 - 8.1 g/dL 6.7  Total Bilirubin 0.3 - 1.2 mg/dL 0.5  Alkaline Phos 38 -  126 U/L 31(L)  AST 15 - 41 U/L 13(L)  ALT 0 - 44 U/L 5    Lab Results  Component Value Date   WBC 5.4 10/27/2021   HGB 12.2 10/27/2021   HCT 36.0 10/27/2021   MCV 92.1 10/27/2021   PLT 192 10/27/2021   NEUTROABS 2.4 10/27/2021    ASSESSMENT & PLAN:  Malignant neoplasm of upper-outer quadrant of right breast in female, estrogen receptor positive ([place]) 10/08/2021: Large palpable lump in the right breast with deformity of the nipple, mammogram and ultrasound revealed 5.1 cm mass central right breast, additional lesion 7 mm and 8 mm, suspicious, morphologically normal intramammary lymph node at 10:00, single right axillary lymph node (both were positive for IDC),  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  Neoadjuvant chemotherapy with dose dense Adriamycin and Cytoxan followed by Taxol weekly x12   2. Mastectomy versus lumpectomy with targeted node dissection 3.  Adjuvant radiation 4.  Followed by adjuvant antiestrogen therapy (ovarian suppression) along with CDK 4 and 6 inhibitor 5.  Genetic testing -------------------------------------------------------------------------------------------------------------   Current Treatment:  Cycle 1 Adriamycin and Cytoxan to start 10/28/2018 Labs reviewed, Chemo consent obtained  CT CAP and bone scan are pending MRI guided biopsies are pending  RTC in 1 week for tox check:     No orders of the defined types were placed in this encounter.  The patient has a good understanding of the overall plan. she agrees with it. she will call with any problems that may develop before the next visit here. Total time spent: 30 mins including face to face time and time spent for planning, charting and co-ordination of care   Harriette Ohara, MD 10/27/21

## 2021-10-27 NOTE — Anesthesia Postprocedure Evaluation (Signed)
Anesthesia Post Note  Patient: Benna Arno  Procedure(s) Performed: INSERTION PORT-A-CATH (Left: Breast)     Patient location during evaluation: PACU Anesthesia Type: General Level of consciousness: awake and alert Pain management: pain level controlled Vital Signs Assessment: post-procedure vital signs reviewed and stable Respiratory status: spontaneous breathing, nonlabored ventilation, respiratory function stable and patient connected to nasal cannula oxygen Cardiovascular status: blood pressure returned to baseline and stable Postop Assessment: no apparent nausea or vomiting Anesthetic complications: no   No notable events documented.  Last Vitals:  Vitals:   10/27/21 0905 10/27/21 0920  BP: 110/70 (!) 112/97  Pulse: 81 69  Resp: 16 15  Temp:  36.4 C  SpO2: 100% 100%    Last Pain:  Vitals:   10/27/21 0920  TempSrc:   PainSc: 0-No pain                 Effie Berkshire

## 2021-10-27 NOTE — Progress Notes (Signed)
Met with patient at registration to introduce myself as Financial Resource Specialist and to offer available resources. ° °Discussed one-time $1000 [employer] grant and qualifications to assist with personal expenses while going through treatment. ° °Gave her my card if interested in applying and for any additional financial questions or concerns. °

## 2021-10-27 NOTE — Transfer of Care (Signed)
Immediate Anesthesia Transfer of Care Note  Patient: Monica [name]  Procedure(s) Performed: INSERTION PORT-A-CATH (Left: Breast)  Patient Location: PACU  Anesthesia Type:General  Level of Consciousness: awake, oriented, drowsy and patient cooperative  Airway & Oxygen Therapy: Patient Spontanous Breathing and Patient connected to nasal cannula oxygen  Post-op Assessment: Report given to RN, Post -op Vital signs reviewed and stable, Patient moving all extremities X 4 and Patient able to stick tongue midline  Post vital signs: Reviewed  Last Vitals:  Vitals Value Taken Time  BP 97/58 10/27/21 0848  Temp 97.8   Pulse 75 10/27/21 0849  Resp 19 10/27/21 0849  SpO2 100 % 10/27/21 0849  Vitals shown include unvalidated device data.  Last Pain:  Vitals:   10/27/21 0617  TempSrc:   PainSc: 0-No pain         Complications: No notable events documented.

## 2021-10-27 NOTE — Op Note (Signed)
PREOPERATIVE DIAGNOSIS:  Right breast cancer, cT3N1, grade 2 invasive ductal carcionoma, +/+/eq     POSTOPERATIVE DIAGNOSIS:  Same     PROCEDURE: Left subclavian port placement, Bard   Power Port, MRI safe, 8-French.      SURGEON:  Stark Klein, MD      ANESTHESIA:  General   FINDINGS:  Good venous return, easy flush, and tip of the catheter and   SVC 23 cm.      SPECIMEN:  None.      ESTIMATED BLOOD LOSS:  Minimal.      COMPLICATIONS:  None known.      PROCEDURE:  Pt was identified in the holding area and taken to   the operating room, where patient was placed supine on the operating room   table.  General anesthesia was induced.  Patient's arms were tucked and the upper   chest and neck were prepped and draped in sterile fashion.  Time-out was   performed according to the surgical safety check list.  When all was   correct, we continued.   Local anesthetic was administered over this   area at the angle of the clavicle.  The vein was accessed with 2 pass(es) of the needle. There was good venous return and the wire passed easily with no ectopy.   Fluoroscopy was used to confirm that the wire was in the vena cava.      The patient was placed back level and the area for the pocket was anethetized   with local anesthetic.  A 3-cm transverse incision was made with a #15   blade.  Cautery was used to divide the subcutaneous tissues down to the   pectoralis muscle.  An [employer] retractor was used to elevate the skin   while a pocket was created on top of the pectoralis fascia.  The port   was placed into the pocket to confirm that it was of adequate size.  The   catheter was preattached to the port.  The port was then secured to the   pectoralis fascia with four 2-0 Prolene sutures.  These were clamped and   not tied down yet.    The catheter was tunneled through to the wire exit   site.  The catheter was placed along the wire to determine what length it should be to be in the SVC.   The catheter was cut at 23 cm.  The tunneler sheath and dilator were passed over the wire and the dilator and wire were removed.  The catheter was advanced through the tunneler sheath and the tunneler sheath was pulled away.  Care was taken to keep the catheter in the tunneler sheath as this occurred. This was advanced and the tunneler sheath was removed.  There was good venous   return and easy flush of the catheter.  The Prolene sutures were tied   down to the pectoral fascia.  The skin was reapproximated using 3-0   Vicryl interrupted deep dermal sutures.    Fluoroscopy was used to re-confirm good position of the catheter.  The skin   was then closed using 4-0 Monocryl in a subcuticular fashion.  The port was flushed with concentrated heparin flush as well.  The wounds were then cleaned, dried, and dressed with Dermabond.  The patient was left accessed for chemotherapy tomorrow.   The patient was awakened from anesthesia and taken to the PACU in stable condition.  Needle, sponge, and instrument counts were correct.  Stark Klein, MD

## 2021-10-27 NOTE — Interval H&P Note (Signed)
History and Physical Interval Note:  10/27/2021 7:30 AM  Monica [name]  has presented today for surgery, with the diagnosis of RIGHT BREAST CANCER.  The various methods of treatment have been discussed with the patient and family. After consideration of risks, benefits and other options for treatment, the patient has consented to  Procedure(s): INSERTION PORT-A-CATH (N/A) as a surgical intervention.  The patient's history has been reviewed, patient examined, no change in status, stable for surgery.  I have reviewed the patient's chart and labs.  Questions were answered to the patient's satisfaction.     Stark Klein

## 2021-10-27 NOTE — Discharge Instructions (Addendum)
[place] Office Phone Number [phone]   POST OP INSTRUCTIONS  Always review your discharge instruction sheet given to you by the facility where your surgery was performed.  IF YOU HAVE DISABILITY OR FAMILY LEAVE FORMS, YOU MUST BRING THEM TO THE OFFICE FOR PROCESSING.  DO NOT GIVE THEM TO YOUR DOCTOR.  A prescription for pain medication may be given to you upon discharge.  Take your pain medication as prescribed, if needed.  If narcotic pain medicine is not needed, then you may take acetaminophen (Tylenol) or ibuprofen (Advil) as needed. Take your usually prescribed medications unless otherwise directed If you need a refill on your pain medication, please contact your pharmacy.  They will contact our office to request authorization.  Prescriptions will not be filled after 5pm or on week-ends. You should eat very light the first 24 hours after surgery, such as soup, crackers, pudding, etc.  Resume your normal diet the day after surgery It is common to experience some constipation if taking pain medication after surgery.  Increasing fluid intake and taking a stool softener will usually help or prevent this problem from occurring.  A mild laxative (Milk of Magnesia or Miralax) should be taken according to package directions if there are no bowel movements after 48 hours. You may shower in 48 hours.  The surgical glue will flake off in 2-3 weeks.   ACTIVITIES:  No strenuous activity or heavy lifting for 1 week.   You may drive when you no longer are taking prescription pain medication, you can comfortably wear a seatbelt, and you can safely maneuver your car and apply brakes. RETURN TO WORK:  __________to be determined_______________ Dennis Bast should see your doctor in the office for a follow-up appointment approximately three-four weeks after your surgery.    WHEN TO CALL YOUR DOCTOR: Fever over 101.0 Nausea and/or vomiting. Extreme swelling or bruising. Continued bleeding from  incision. Increased pain, redness, or drainage from the incision.  The clinic staff is available to answer your questions during regular business hours.  Please dont hesitate to call and ask to speak to one of the nurses for clinical concerns.  If you have a medical emergency, go to the nearest emergency room or call 911.  A surgeon from [facility] is always on call at the hospital.  For further questions, please visit [email or web address]

## 2021-10-27 NOTE — Progress Notes (Signed)
New Breast Cancer Diagnosis: Right Breast  Did patient present with symptoms (if so, please note symptoms) or screening mammography?:Palpable mass    Location and Extent of disease :Right breast. Located at 11 to 2 o'clock position, measured  5.1 cm in greatest dimension.  There was an additional mass in the 10 o'clock position.  Adenopathy yes, 1 axillary lymph node.  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   Medical oncologist, treatment if any:   Dr. Lindi Adie 10/27/2021 Treatment plan: 1.  Neoadjuvant chemotherapy with dose dense Adriamycin and Cytoxan followed by Taxol weekly x12   2. Mastectomy versus lumpectomy with targeted node dissection 3.  Adjuvant radiation 4.  Followed by adjuvant antiestrogen therapy (ovarian suppression) along with CDK 4 and 6 inhibitor 5.  Genetic testing   Family History of Breast/Ovarian/Prostate Cancer: Maternal grandmother  Lymphedema issues, if any: No      Pain issues, if any: Has some soreness and pain from port insertion yesterday.    SAFETY ISSUES: Prior radiation? No Pacemaker/ICD? No Possible current pregnancy? Having Cycles, on Depo Shot q3 months Is the patient on methotrexate? No  Current Complaints / other details:   Port Insertion 10/27/2021

## 2021-10-27 NOTE — H&P (Signed)
REFERRING PHYSICIAN: Kaleen Mask, PA-C  PROVIDER: Georgianne Fick, MD  Care Team: Patient Care Team: Long, Blanche East, PA as PCP - General (Family Medicine) Barry Dienes Ballard Russell, MD as Consulting Provider (Surgical Oncology) Rulon Eisenmenger, MD (Hematology and Oncology)   MRN: [id number] DOB: [birth year] DATE OF ENCOUNTER: 10/20/2021  Subjective   Chief Complaint: Right Breast Cancer   History of Present Illness: Monica [name] is a 26 y.o. female who is seen today as an office consultation at the request of PA Long for evaluation of Right Breast Cancer .   Patient is a lovely 26 year old female who presents with a new diagnosis of right breast cancer from December 2022. She presented with a palpable mass. Given her age, she had not been getting any screening studies. Diagnostic imaging demonstrated breast density C. There was also an irregular mass centrally from 11:00 to 2:00 measuring at least 5.1 cm. There were several smaller irregular components adjacent to this. There is an intramammary lymph node at 10:00 measuring 5 mm. And an abnormal rounded lymph node measuring 6 mm in the right axilla. The left breast appeared benign. She subsequently had core needle biopsies of all 3 lesions. She was seen to have invasive mammary carcinoma that was ductal phenotype. This was ER and PR positive with a Ki-67 of 60%. HER2 was equivocal and FISH is pending.  She is married and works from home. She has 2 daughters, ages 76 and 73. She is accompanied by her husband. Of note, her maternal grandmother had breast cancer diagnosed at age 35. She does not have other family history of cancer that she is aware of.  Diagnostic mammogram/us: 10/02/21 ACR Breast Density Category c: The breast tissue is heterogeneously dense, which may obscure small masses.   FINDINGS: A radiopaque BB was placed at the site of the patient's palpable lumps within the right breast. Increased density with  diffuse retraction is demonstrated involving the entire central right breast within all 4 quadrants. Note is made of a circumscribed mass in the upper outer quadrant, likely representing an enlarged intramammary lymph node. No definite focal or suspicious findings on the left. Further evaluation with ultrasound was performed on the right.   On physical exam, the entire right breast is retracted in decreased in size when compared to the left. There is additional retraction and dimpling of the right nipple. I palpate a large firm mass, encompassing the entire upper and central right breast.   Targeted ultrasound is performed, showing a large, irregular mass with associated shadowing and distortion encompassing the entire right breast from the 11 o'clock to 2 o'clock position. Exact measurements are difficult due to the size, but it measures at least 5.1 x 3.1 cm. There is associated vascularity. A slightly more focal, irregular component is noted at the 11 o'clock position 5 cm from the nipple, measuring 7 x 6 x 3 mm and at the 8:30 position 6 cm from the nipple, measuring 8 x 6 x 3 mm. There is a morphologically abnormal intramammary lymph node with cortical thickening up to 5 mm at the 10 o'clock position 8 cm from the nipple. Additionally, evaluation right axilla demonstrates a rounded, abnormal lymph node with complete hilar effacement. It measures 6 x 6 x 5 mm. No additional axillary lymphadenopathy noted.   IMPRESSION: 1. Highly suspicious mass involving the entire central right breast measuring up to 5.1 cm. This corresponds with the patient's palpable lump and overall deformity of the breast. Recommendation is  for ultrasound-guided biopsy. Additional more focal areas are noted along the 11 o'clock and 8:30 axis. 2. Suspicious, morphologically normal intramammary lymph node at the 10 o'clock position 8 cm from the nipple. Recommendation is for ultrasound-guided biopsy. 3.  Single suspicious right axillary lymph node demonstrating complete hilar effacement. Recommendation is for ultrasound-guided biopsy. 4. No mammographic evidence of malignancy on the left.   RECOMMENDATION: 1. Three area ultrasound-guided biopsy of the palpable right breast mass, intramammary lymph node and axillary lymph node. 2. Pending pathology results, further evaluation with breast MRI is recommended given the extent of disease and multiplicity of findings.   I have discussed the findings and recommendations with the patient. If applicable, a reminder letter will be sent to the patient regarding the next appointment.   BI-RADS CATEGORY 5: Highly suggestive of malignancy.    Pathology core needle biopsy: 10/08/21 1. Breast, right, needle core biopsy, 12 o'clock, 4cm, coil - INVASIVE MAMMARY CARCINOMA - SEE COMMENT 2. Breast, right, needle core biopsy, 10 o'clock, ribbon, 8cmfn - INTRAMAMMARY LYMPH NODE WITH CARCINOMA 3. Lymph node, needle/core biopsy, right axillary, heart - METASTATIC CARCINOMA INVOLVING NODAL TISSUE 1. and 3. E-cadherin is POSITIVE supporting a ductal origin.  Receptors: The tumor cells are EQUIVOCAL for Her2 (2+). Her2 by fish will be performed and results reported separately. Estrogen Receptor: 95%, POSITIVE, STRONG STAINING INTENSITY Progesterone Receptor: 40%, POSITIVE, MODERATE STAINING INTENSITY Proliferation Marker Ki67: 60%  Review of Systems: A complete review of systems was obtained from the patient. I have reviewed this information and discussed as appropriate with the patient. See HPI as well for other ROS.  Review of Systems  All other systems reviewed and are negative.   Medical History: Past Medical History:  Diagnosis Date   History of cancer   Patient Active Problem List  Diagnosis   Malignant neoplasm of upper-outer quadrant of right breast in female, estrogen receptor positive (CMS-HCC)   History reviewed. No pertinent  surgical history.   No Known Allergies  No current outpatient medications on file prior to visit.   No current facility-administered medications on file prior to visit.   Family History  Problem Relation Age of Onset   Breast cancer Maternal Grandmother    Social History   Tobacco Use  Smoking Status Never  Smokeless Tobacco Never    Social History   Socioeconomic History   Marital status: Single  Tobacco Use   Smoking status: Never   Smokeless tobacco: Never  Substance and Sexual Activity   Alcohol use: Never   Drug use: Never   Objective:   Vitals:  10/20/21 1019  Weight: 62.3 kg (137 lb 6.4 oz)  Height: 167.6 cm (5' 6" )   Body mass index is 22.18 kg/m.  Gen: No acute distress. Well nourished and well groomed.  Neurological: Alert and oriented to person, place, and time. Coordination normal.  Head: Normocephalic and atraumatic.  Eyes: Conjunctivae are normal. Pupils are equal, round, and reactive to light. No scleral icterus.  Neck: Normal range of motion. Neck supple. No tracheal deviation or thyromegaly present.  Cardiovascular: Normal rate, regular rhythm, normal heart sounds and intact distal pulses. Exam reveals no gallop and no friction rub. No murmur heard. Breast: breasts ptotic bilaterally. Large mass on right distorting contour of the upper outer right breast with nipple retraction, around 6 cm. No palpable nodes on either side. Left breast benign.  Respiratory: Effort normal. No respiratory distress. No chest wall tenderness. Breath sounds normal. No wheezes, rales or rhonchi.  GI: Soft. Bowel sounds are normal. The abdomen is soft and nontender. There is no rebound and no guarding.  Musculoskeletal: Normal range of motion. Extremities are nontender.  Lymphadenopathy: No cervical, preauricular, postauricular or axillary adenopathy is present Skin: Skin is warm and dry. No rash noted. No diaphoresis. No erythema. No pallor. No clubbing, cyanosis, or  edema.  Psychiatric: Normal mood and affect. Behavior is normal. Judgment and thought content normal.   Labs None at present.  Assessment and Plan:   Malignant neoplasm of upper-outer quadrant of right breast in female, estrogen receptor positive (CMS-HCC) Patient has already seen Dr. Lindi Adie of oncology and neoadjuvant chemotherapy is planned. I will order breast MRI to fully evaluate the right breast as well as the left breast. The size on the mammogram and ultrasound are difficult to measure given the contraction and nipple distortion. Additionally, we need a better evaluation of the extent of disease. Dr. Lindi Adie has referred her to genetics.  I will order staging scans as she is having some right leg pain.   I will plan port placement. I discussed the port with the patient and her husband. I reviewed what happened during surgery, location of incisions, as well as risks of surgery. I discussed the risk of pneumothorax and chest tube. I also discussed the risk of blood clot, infection, and malposition. I discussed the need for potential revision.  We will schedule this urgently. I will communicate with oncology to ascertain target date of initiation of chemotherapy.  I will refer her nonurgently to plastic surgery. Currently she is a potential candidate for breast conservation if she has good response to chemotherapy. However, the patient is considering mastectomy given the size of the mass and her young age. This way, she will have all the information she needs to help make a decision. Additionally, we may have findings on the MRI that may warrant mastectomy either way.  With regard to her nodes, I would plan a targeted node dissection. If we did elect for breast conservation, the intramammary node might need seed targeting.  No follow-ups on file.  Milus Height, MD FACS Surgical Oncology, General Surgery, Trauma and [facility] [place]

## 2021-10-27 NOTE — Anesthesia Preprocedure Evaluation (Addendum)
Anesthesia Evaluation  Patient identified by MRN, date of birth, ID band Patient awake    Reviewed: Allergy & Precautions, NPO status , Patient's Chart, lab work & pertinent test results  Airway Mallampati: I  TM Distance: >3 FB Neck ROM: Full    Dental  (+) Teeth Intact, Dental Advisory Given   Pulmonary neg pulmonary ROS,    breath sounds clear to auscultation       Cardiovascular negative cardio ROS   Rhythm:Regular Rate:Normal  Echo:  1. Left ventricular ejection fraction, by estimation, is 55 to 60%. The  left ventricle has normal function. The left ventricle has no regional  wall motion abnormalities. Left ventricular diastolic parameters were  normal.  2. Right ventricular systolic function is normal. The right ventricular  size is normal.  3. The mitral valve is normal in structure. Trivial mitral valve  regurgitation.  4. The aortic valve is tricuspid. Aortic valve regurgitation is not  visualized. No aortic stenosis is present.  5. The inferior vena cava is normal in size with greater than 50%  respiratory variability, suggesting right atrial pressure of 3 mmHg.    Neuro/Psych negative neurological ROS  negative psych ROS   GI/Hepatic negative GI ROS, Neg liver ROS,   Endo/Other  negative endocrine ROS  Renal/GU negative Renal ROS     Musculoskeletal   Abdominal Normal abdominal exam  (+)   Peds  Hematology negative hematology ROS (+)   Anesthesia Other Findings   Reproductive/Obstetrics                           Anesthesia Physical Anesthesia Plan  ASA: 2  Anesthesia Plan: General   Post-op Pain Management:    Induction:   PONV Risk Score and Plan: 4 or greater and Ondansetron, Dexamethasone, Midazolam and Scopolamine patch - Pre-op  Airway Management Planned: LMA  Additional Equipment: None  Intra-op Plan:   Post-operative Plan: Extubation in  OR  Informed Consent: I have reviewed the patients History and Physical, chart, labs and discussed the procedure including the risks, benefits and alternatives for the proposed anesthesia with the patient or authorized representative who has indicated his/her understanding and acceptance.     Dental advisory given  Plan Discussed with: CRNA  Anesthesia Plan Comments: ( )       Anesthesia Quick Evaluation

## 2021-10-28 ENCOUNTER — Ambulatory Visit
Admission: RE | Admit: 2021-10-28 | Discharge: 2021-10-28 | Disposition: A | Discharge status: Home / Self Care | Payer: Medicaid Other | Source: Ambulatory Visit | Attending: Radiation Oncology | Admitting: Radiation Oncology

## 2021-10-28 ENCOUNTER — Other Ambulatory Visit: Payer: Medicaid Other

## 2021-10-28 ENCOUNTER — Other Ambulatory Visit: Payer: Self-pay

## 2021-10-28 ENCOUNTER — Inpatient Hospital Stay: Payer: Medicaid Other

## 2021-10-28 ENCOUNTER — Encounter (HOSPITAL_COMMUNITY): Payer: Self-pay | Admitting: General Surgery

## 2021-10-28 ENCOUNTER — Encounter: Payer: Self-pay | Admitting: *Deleted

## 2021-10-28 VITALS — BP 114/72 | HR 85 | Temp 98.2°F | Resp 18

## 2021-10-28 VITALS — BP 116/81 | HR 85 | Temp 97.7°F | Resp 18 | Ht 66.0 in | Wt 140.1 lb

## 2021-10-28 DIAGNOSIS — Z803 Family history of malignant neoplasm of breast: Secondary | ICD-10-CM | POA: Diagnosis not present

## 2021-10-28 DIAGNOSIS — C773 Secondary and unspecified malignant neoplasm of axilla and upper limb lymph nodes: Secondary | ICD-10-CM | POA: Insufficient documentation

## 2021-10-28 DIAGNOSIS — Z5111 Encounter for antineoplastic chemotherapy: Secondary | ICD-10-CM | POA: Diagnosis not present

## 2021-10-28 DIAGNOSIS — C50411 Malignant neoplasm of upper-outer quadrant of right female breast: Secondary | ICD-10-CM | POA: Diagnosis present

## 2021-10-28 DIAGNOSIS — Z170 Estrogen receptor positive status [ER+]: Secondary | ICD-10-CM | POA: Diagnosis not present

## 2021-10-28 DIAGNOSIS — Z7952 Long term (current) use of systemic steroids: Secondary | ICD-10-CM | POA: Insufficient documentation

## 2021-10-28 MED ORDER — PALONOSETRON HCL INJECTION 0.25 MG/5ML
0.2500 mg | Freq: Once | INTRAVENOUS | Status: AC
  Administered 2021-10-28: 0.25 mg via INTRAVENOUS
  Filled 2021-10-28: qty 5

## 2021-10-28 MED ORDER — HEPARIN SOD (PORK) LOCK FLUSH 100 UNIT/ML IV SOLN
500.0000 [IU] | Freq: Once | INTRAVENOUS | Status: AC | PRN
  Administered 2021-10-28: 500 [IU]

## 2021-10-28 MED ORDER — SODIUM CHLORIDE 0.9% FLUSH
10.0000 mL | INTRAVENOUS | Status: DC | PRN
  Administered 2021-10-28: 10 mL

## 2021-10-28 MED ORDER — SODIUM CHLORIDE 0.9 % IV SOLN
600.0000 mg/m2 | Freq: Once | INTRAVENOUS | Status: AC
  Administered 2021-10-28: 1040 mg via INTRAVENOUS
  Filled 2021-10-28: qty 52

## 2021-10-28 MED ORDER — SODIUM CHLORIDE 0.9 % IV SOLN: Freq: Once | INTRAVENOUS | Status: AC

## 2021-10-28 MED ORDER — SODIUM CHLORIDE 0.9 % IV SOLN
10.0000 mg | Freq: Once | INTRAVENOUS | Status: AC
  Administered 2021-10-28: 10 mg via INTRAVENOUS
  Filled 2021-10-28: qty 1

## 2021-10-28 MED ORDER — SODIUM CHLORIDE 0.9 % IV SOLN
150.0000 mg | Freq: Once | INTRAVENOUS | Status: AC
  Administered 2021-10-28: 150 mg via INTRAVENOUS
  Filled 2021-10-28: qty 5

## 2021-10-28 MED ORDER — DOXORUBICIN HCL CHEMO IV INJECTION 2 MG/ML
60.0000 mg/m2 | Freq: Once | INTRAVENOUS | Status: AC
  Administered 2021-10-28: 104 mg via INTRAVENOUS
  Filled 2021-10-28: qty 52

## 2021-10-28 NOTE — Progress Notes (Signed)
Pharmacist Chemotherapy Monitoring - Initial Assessment    Anticipated start date: 10/28/21   The following has been reviewed per standard work regarding the patient's treatment regimen: The patient's diagnosis, treatment plan and drug doses, and organ/hematologic function Lab orders and baseline tests specific to treatment regimen  The treatment plan start date, drug sequencing, and pre-medications Prior authorization status  Patient's documented medication list, including drug-drug interaction screen and prescriptions for anti-emetics and supportive care specific to the treatment regimen The drug concentrations, fluid compatibility, administration routes, and timing of the medications to be used The patient's access for treatment and lifetime cumulative dose history, if applicable  The patient's medication allergies and previous infusion related reactions, if applicable   Changes made to treatment plan:  N/A  Follow up needed:  N/A   Patrica Duel, [facility], 10/28/2021  12:09 PM

## 2021-10-28 NOTE — Patient Instructions (Signed)
[facility]  Discharge Instructions: Thank you for choosing [facility] to provide your oncology and hematology care.   If you have a lab appointment with the [facility], please go directly to the [facility] and check in at the registration area.   Wear comfortable clothing and clothing appropriate for easy access to any Portacath or PICC line.   We strive to give you quality time with your provider. You may need to reschedule your appointment if you arrive late (15 or more minutes).  Arriving late affects you and other patients whose appointments are after yours.  Also, if you miss three or more appointments without notifying the office, you may be dismissed from the clinic at the providers discretion.      For prescription refill requests, have your pharmacy contact our office and allow 72 hours for refills to be completed.    Today you received the following chemotherapy and/or immunotherapy agents: doxorubicin, cyclophosphamide      To help prevent nausea and vomiting after your treatment, we encourage you to take your nausea medication as directed.  BELOW ARE SYMPTOMS THAT SHOULD BE REPORTED IMMEDIATELY: *FEVER GREATER THAN 100.4 F (38 C) OR HIGHER *CHILLS OR SWEATING *NAUSEA AND VOMITING THAT IS NOT CONTROLLED WITH YOUR NAUSEA MEDICATION *UNUSUAL SHORTNESS OF BREATH *UNUSUAL BRUISING OR BLEEDING *URINARY PROBLEMS (pain or burning when urinating, or frequent urination) *BOWEL PROBLEMS (unusual diarrhea, constipation, pain near the anus) TENDERNESS IN MOUTH AND THROAT WITH OR WITHOUT PRESENCE OF ULCERS (sore throat, sores in mouth, or a toothache) UNUSUAL RASH, SWELLING OR PAIN  UNUSUAL VAGINAL DISCHARGE OR ITCHING   Items with * indicate a potential emergency and should be followed up as soon as possible or go to the Emergency Department if any problems should occur.  Please show the CHEMOTHERAPY ALERT CARD or IMMUNOTHERAPY ALERT  CARD at check-in to the Emergency Department and triage nurse.  Should you have questions after your visit or need to cancel or reschedule your appointment, please contact [facility]  Dept: [phone]  and follow the prompts.  Office hours are 8:00 a.m. to 4:30 p.m. Monday - Friday. Please note that voicemails left after 4:00 p.m. may not be returned until the following business day.  We are closed weekends and major holidays. You have access to a nurse at all times for urgent questions. Please call the main number to the clinic Dept: [phone] and follow the prompts.   For any non-urgent questions, you may also contact your provider using MyChart. We now offer e-Visits for anyone 38 and older to request care online for non-urgent symptoms. For details visit [email or web address].   Also download the MyChart app! Go to the app store, search "MyChart", open the app, select [facility], and log in with your MyChart username and password.  Due to Covid, a mask is required upon entering the hospital/clinic. If you do not have a mask, one will be given to you upon arrival. For doctor visits, patients may have 1 support person aged 16 or older with them. For treatment visits, patients cannot have anyone with them due to current Covid guidelines and our immunocompromised population.   Doxorubicin injection What is this medication? DOXORUBICIN (dox oh ROO bi sin) is a chemotherapy drug. It is used to treat many kinds of cancer like leukemia, lymphoma, neuroblastoma, sarcoma, and Wilms' tumor. It is also used to treat bladder cancer, breast cancer, lung cancer, ovarian cancer, stomach  cancer, and thyroid cancer. This medicine may be used for other purposes; ask your health care provider or pharmacist if you have questions. COMMON BRAND NAME(S): Adriamycin, Adriamycin PFS, Adriamycin RDF, Rubex What should I tell my care team before I take this medication? They need to know  if you have any of these conditions: heart disease history of low blood counts caused by a medicine liver disease recent or ongoing radiation therapy an unusual or allergic reaction to doxorubicin, other chemotherapy agents, other medicines, foods, dyes, or preservatives pregnant or trying to get pregnant breast-feeding How should I use this medication? This drug is given as an infusion into a vein. It is administered in a hospital or clinic by a specially trained health care professional. If you have pain, swelling, burning or any unusual feeling around the site of your injection, tell your health care professional right away. Talk to your pediatrician regarding the use of this medicine in children. Special care may be needed. Overdosage: If you think you have taken too much of this medicine contact a poison control center or emergency room at once. NOTE: This medicine is only for you. Do not share this medicine with others. What if I miss a dose? It is important not to miss your dose. Call your doctor or health care professional if you are unable to keep an appointment. What may interact with this medication? This medicine may interact with the following medications: 6-mercaptopurine paclitaxel phenytoin St. John's Wort trastuzumab verapamil This list may not describe all possible interactions. Give your health care provider a list of all the medicines, herbs, non-prescription drugs, or dietary supplements you use. Also tell them if you smoke, drink alcohol, or use illegal drugs. Some items may interact with your medicine. What should I watch for while using this medication? This drug may make you feel generally unwell. This is not uncommon, as chemotherapy can affect healthy cells as well as cancer cells. Report any side effects. Continue your course of treatment even though you feel ill unless your doctor tells you to stop. There is a maximum amount of this medicine you should receive  throughout your life. The amount depends on the medical condition being treated and your overall health. Your doctor will watch how much of this medicine you receive in your lifetime. Tell your doctor if you have taken this medicine before. You may need blood work done while you are taking this medicine. Your urine may turn red for a few days after your dose. This is not blood. If your urine is dark or brown, call your doctor. In some cases, you may be given additional medicines to help with side effects. Follow all directions for their use. Call your doctor or health care professional for advice if you get a fever, chills or sore throat, or other symptoms of a cold or flu. Do not treat yourself. This drug decreases your body's ability to fight infections. Try to avoid being around people who are sick. This medicine may increase your risk to bruise or bleed. Call your doctor or health care professional if you notice any unusual bleeding. Talk to your doctor about your risk of cancer. You may be more at risk for certain types of cancers if you take this medicine. Do not become pregnant while taking this medicine or for 6 months after stopping it. Women should inform their doctor if they wish to become pregnant or think they might be pregnant. Men should not father a child while taking  this medicine and for 6 months after stopping it. There is a potential for serious side effects to an unborn child. Talk to your health care professional or pharmacist for more information. Do not breast-feed an infant while taking this medicine. This medicine has caused ovarian failure in some women and reduced sperm counts in some men This medicine may interfere with the ability to have a child. Talk with your doctor or health care professional if you are concerned about your fertility. This medicine may cause a decrease in Co-Enzyme Q-10. You should make sure that you get enough Co-Enzyme Q-10 while you are taking this  medicine. Discuss the foods you eat and the vitamins you take with your health care professional. What side effects may I notice from receiving this medication? Side effects that you should report to your doctor or health care professional as soon as possible: allergic reactions like skin rash, itching or hives, swelling of the face, lips, or tongue breathing problems chest pain fast or irregular heartbeat low blood counts - this medicine may decrease the number of white blood cells, red blood cells and platelets. You may be at increased risk for infections and bleeding. pain, redness, or irritation at site where injected signs of infection - fever or chills, cough, sore throat, pain or difficulty passing urine signs of decreased platelets or bleeding - bruising, pinpoint red spots on the skin, black, tarry stools, blood in the urine swelling of the ankles, feet, hands tiredness weakness Side effects that usually do not require medical attention (report to your doctor or health care professional if they continue or are bothersome): diarrhea hair loss mouth sores nail discoloration or damage nausea red colored urine vomiting This list may not describe all possible side effects. Call your doctor for medical advice about side effects. You may report side effects to FDA at 1-800-FDA-1088. Where should I keep my medication? This drug is given in a hospital or clinic and will not be stored at home. NOTE: This sheet is a summary. It may not cover all possible information. If you have questions about this medicine, talk to your doctor, pharmacist, or health care provider.  2022 Elsevier/Gold Standard (2017-06-09 00:00:00)  Cyclophosphamide Injection What is this medication? CYCLOPHOSPHAMIDE (sye kloe FOSS fa mide) is a chemotherapy drug. It slows the growth of cancer cells. This medicine is used to treat many types of cancer like lymphoma, myeloma, leukemia, breast cancer, and ovarian cancer,  to name a few. This medicine may be used for other purposes; ask your health care provider or pharmacist if you have questions. COMMON BRAND NAME(S): Cytoxan, Neosar What should I tell my care team before I take this medication? They need to know if you have any of these conditions: heart disease history of irregular heartbeat infection kidney disease liver disease low blood counts, like white cells, platelets, or red blood cells on hemodialysis recent or ongoing radiation therapy scarring or thickening of the lungs trouble passing urine an unusual or allergic reaction to cyclophosphamide, other medicines, foods, dyes, or preservatives pregnant or trying to get pregnant breast-feeding How should I use this medication? This drug is usually given as an injection into a vein or muscle or by infusion into a vein. It is administered in a hospital or clinic by a specially trained health care professional. Talk to your pediatrician regarding the use of this medicine in children. Special care may be needed. Overdosage: If you think you have taken too much of this medicine contact a  poison control center or emergency room at once. NOTE: This medicine is only for you. Do not share this medicine with others. What if I miss a dose? It is important not to miss your dose. Call your doctor or health care professional if you are unable to keep an appointment. What may interact with this medication? amphotericin B azathioprine certain antivirals for HIV or hepatitis certain medicines for blood pressure, heart disease, irregular heart beat certain medicines that treat or prevent blood clots like warfarin certain other medicines for cancer cyclosporine etanercept indomethacin medicines that relax muscles for surgery medicines to increase blood counts metronidazole This list may not describe all possible interactions. Give your health care provider a list of all the medicines, herbs,  non-prescription drugs, or dietary supplements you use. Also tell them if you smoke, drink alcohol, or use illegal drugs. Some items may interact with your medicine. What should I watch for while using this medication? Your condition will be monitored carefully while you are receiving this medicine. You may need blood work done while you are taking this medicine. Drink water or other fluids as directed. Urinate often, even at night. Some products may contain alcohol. Ask your health care professional if this medicine contains alcohol. Be sure to tell all health care professionals you are taking this medicine. Certain medicines, like metronidazole and disulfiram, can cause an unpleasant reaction when taken with alcohol. The reaction includes flushing, headache, nausea, vomiting, sweating, and increased thirst. The reaction can last from 30 minutes to several hours. Do not become pregnant while taking this medicine or for 1 year after stopping it. Women should inform their health care professional if they wish to become pregnant or think they might be pregnant. Men should not father a child while taking this medicine and for 4 months after stopping it. There is potential for serious side effects to an unborn child. Talk to your health care professional for more information. Do not breast-feed an infant while taking this medicine or for 1 week after stopping it. This medicine has caused ovarian failure in some women. This medicine may make it more difficult to get pregnant. Talk to your health care professional if you are concerned about your fertility. This medicine has caused decreased sperm counts in some men. This may make it more difficult to father a child. Talk to your health care professional if you are concerned about your fertility. Call your health care professional for advice if you get a fever, chills, or sore throat, or other symptoms of a cold or flu. Do not treat yourself. This medicine  decreases your body's ability to fight infections. Try to avoid being around people who are sick. Avoid taking medicines that contain aspirin, acetaminophen, ibuprofen, naproxen, or ketoprofen unless instructed by your health care professional. These medicines may hide a fever. Talk to your health care professional about your risk of cancer. You may be more at risk for certain types of cancer if you take this medicine. If you are going to need surgery or other procedure, tell your health care professional that you are using this medicine. Be careful brushing or flossing your teeth or using a toothpick because you may get an infection or bleed more easily. If you have any dental work done, tell your dentist you are receiving this medicine. What side effects may I notice from receiving this medication? Side effects that you should report to your doctor or health care professional as soon as possible: allergic reactions like skin  rash, itching or hives, swelling of the face, lips, or tongue breathing problems nausea, vomiting signs and symptoms of bleeding such as bloody or black, tarry stools; red or dark brown urine; spitting up blood or brown material that looks like coffee grounds; red spots on the skin; unusual bruising or bleeding from the eyes, gums, or nose signs and symptoms of heart failure like fast, irregular heartbeat, sudden weight gain; swelling of the ankles, feet, hands signs and symptoms of infection like fever; chills; cough; sore throat; pain or trouble passing urine signs and symptoms of kidney injury like trouble passing urine or change in the amount of urine signs and symptoms of liver injury like dark yellow or brown urine; general ill feeling or flu-like symptoms; light-colored stools; loss of appetite; nausea; right upper belly pain; unusually weak or tired; yellowing of the eyes or skin Side effects that usually do not require medical attention (report to your doctor or health  care professional if they continue or are bothersome): confusion decreased hearing diarrhea facial flushing hair loss headache loss of appetite missed menstrual periods signs and symptoms of low red blood cells or anemia such as unusually weak or tired; feeling faint or lightheaded; falls skin discoloration This list may not describe all possible side effects. Call your doctor for medical advice about side effects. You may report side effects to FDA at 1-800-FDA-1088. Where should I keep my medication? This drug is given in a hospital or clinic and will not be stored at home. NOTE: This sheet is a summary. It may not cover all possible information. If you have questions about this medicine, talk to your doctor, pharmacist, or health care provider.  2022 Elsevier/Gold Standard (2021-06-23 00:00:00)

## 2021-10-28 NOTE — Progress Notes (Signed)
Patient presents for treatment. RN assessment completed along with the following:  Labs/vitals reviewed - Yes, and within treatment parameters.   Weight within 10% of previous measurement - Yes Oncology Treatment Attestation completed for current therapy- Yes, on date 10/16/21 Informed consent completed and reflects current therapy/intent - Yes, on date 10/28/21             Provider progress note reviewed - Patient not seen by provider today. Most recent note dated 10/27/21 reviewed. Treatment/Antibody/Supportive plan reviewed - Yes, and there are no adjustments needed for today's treatment. S&H and other orders reviewed - Yes, and there are no additional orders identified. Previous treatment date reviewed - Yes, and the appropriate amount of time has elapsed between treatments. Patient to proceed with treatment.    Patient tolerated treatment without any major issues.  Patient reported the "feeling to sneeze" at the completion of the cytoxan, but denied headache.  Instructed patient call office if she developed a headache.  Patient verbalized understanding. VSS upon leaving infusion room.

## 2021-10-29 ENCOUNTER — Ambulatory Visit
Admission: RE | Admit: 2021-10-29 | Discharge: 2021-10-29 | Disposition: A | Discharge status: Home / Self Care | Payer: Medicaid Other | Source: Ambulatory Visit | Attending: Hematology and Oncology | Admitting: Hematology and Oncology

## 2021-10-29 ENCOUNTER — Telehealth: Payer: Self-pay

## 2021-10-29 ENCOUNTER — Other Ambulatory Visit: Payer: Self-pay | Admitting: General Practice

## 2021-10-29 DIAGNOSIS — R9389 Abnormal findings on diagnostic imaging of other specified body structures: Secondary | ICD-10-CM

## 2021-10-29 IMAGING — MR MR BREAST BX W LOC DEV 1ST LESION IMAGE BX SPEC MR GUIDE*L*
7 of 10 series · 33 of 48 positions shown · IV contrast (5 ML GADAVIST)
Comparison: Previous exams.
COMPARISON: Previous exams.

Addendum:
CLINICAL DATA: 25-year-old female presenting for biopsy of non mass
enhancement the left breast. Patient was recently diagnosed with
right breast invasive carcinoma.

EXAM:
MRI GUIDED CORE NEEDLE BIOPSY OF THE LEFT BREAST
TECHNIQUE: Multiplanar, multisequence MR imaging of the left breast was
performed both before and after administration of intravenous
contrast.
CONTRAST:  5 mL Gadavist.

[Series 2: fiducial unilateral · sagittal · 2.0mm · 1.33mm/px · 3 of 52 slices shown]
[im 1/52]
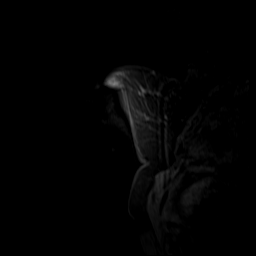
[im 26/52]
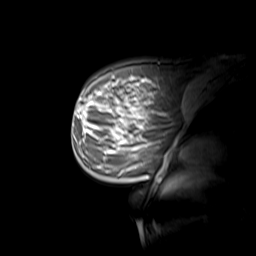
[im 52/52]
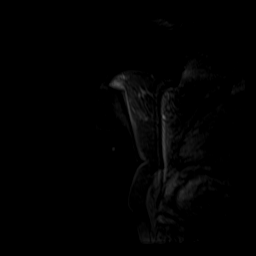

[Series 3: dynamic pre · axial · non-contrast · 1.3mm · 0.73mm/px · z∈[-84,+102]mm · 5 of 144 slices shown]
[im 1/144]
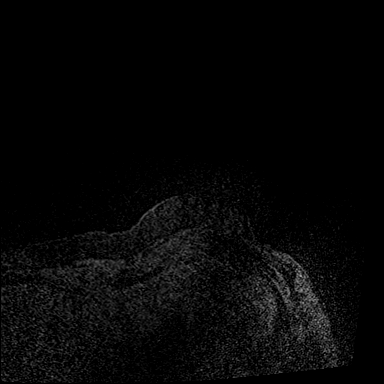
[im 36/144]
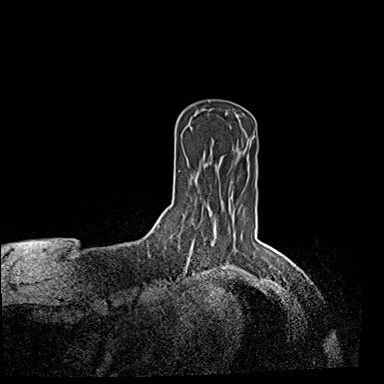
[im 72/144]
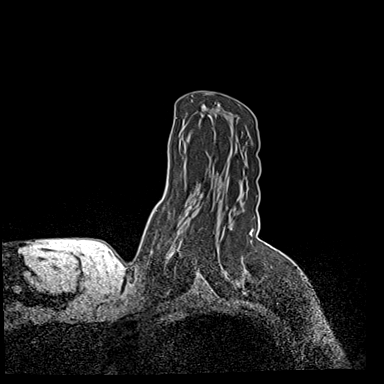
[im 108/144]
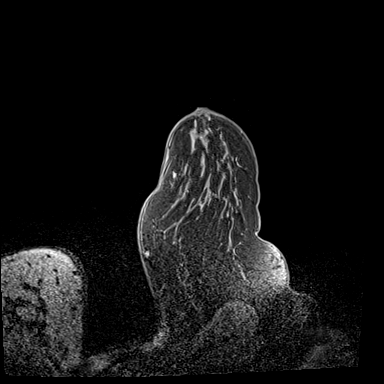
[im 144/144]
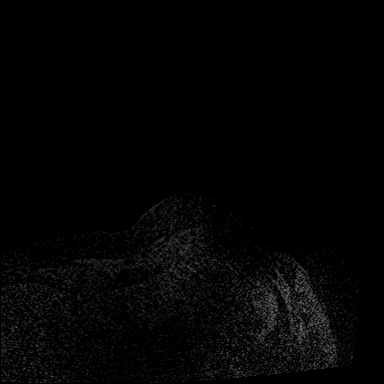

[Series 4: dynamic post 20 · axial · 1.3mm · 0.73mm/px · z∈[-84,+102]mm · 5 of 144 slices shown (1 of 2)]
[im 1/144]
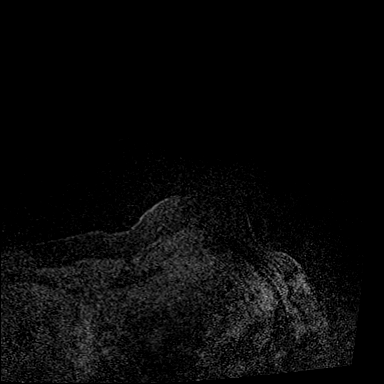
[im 36/144]
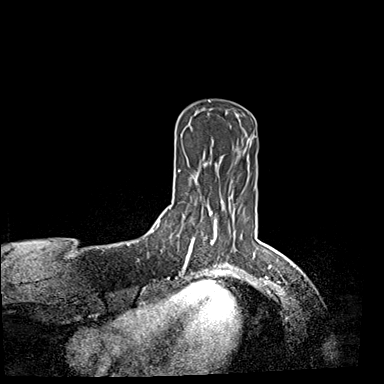
[im 72/144]
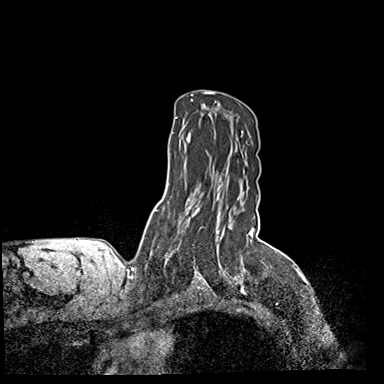
[im 108/144]
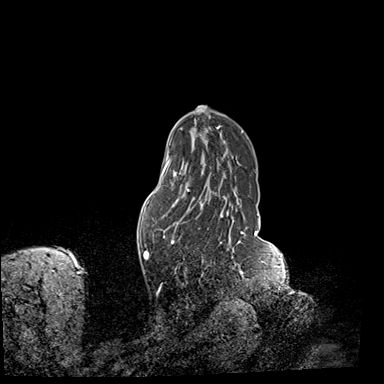
[im 144/144]
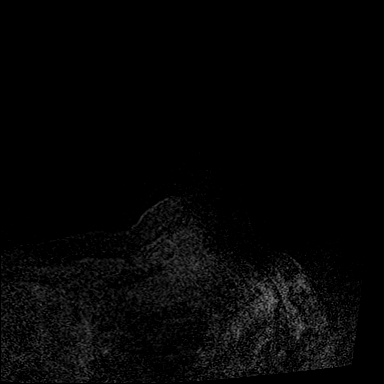

[Series 5: dynamic post 20 · axial · 1.3mm · 0.73mm/px · z∈[-84,+102]mm · 5 of 144 slices shown (2 of 2)]
[im 1/144]
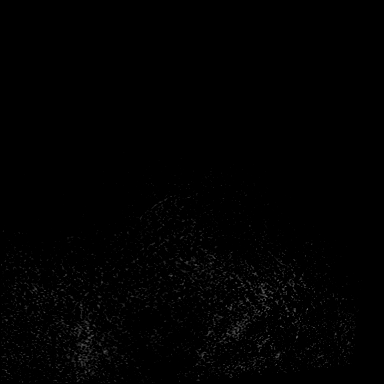
[im 36/144]
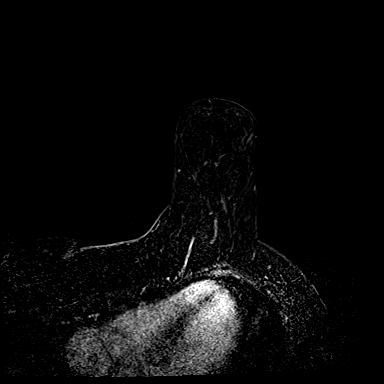
[im 72/144]
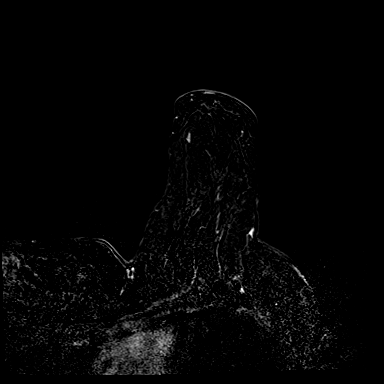
[im 108/144]
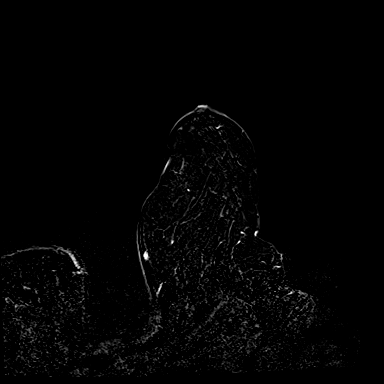
[im 144/144]
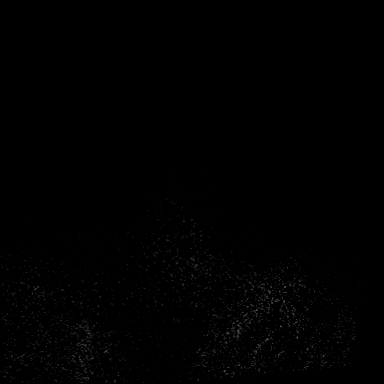

[Series 6: dynamic post 3 · axial · 1.3mm · 0.73mm/px · z∈[-84,+102]mm · 5 of 144 slices shown (1 of 2)]
[im 1/144]
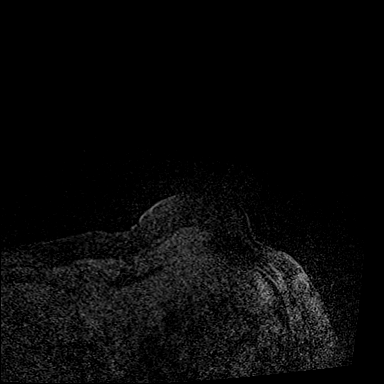
[im 36/144]
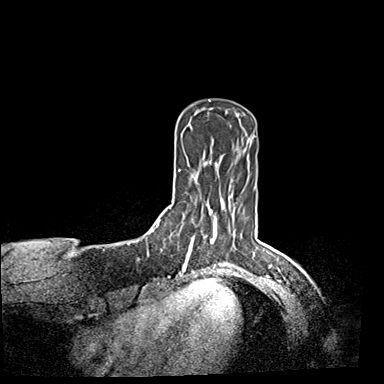
[im 72/144]
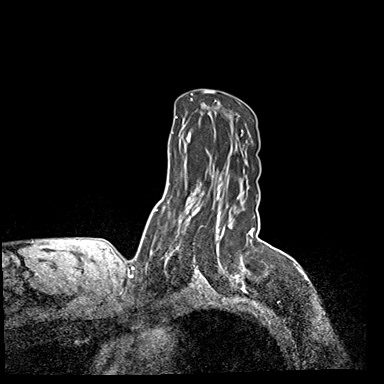
[im 108/144]
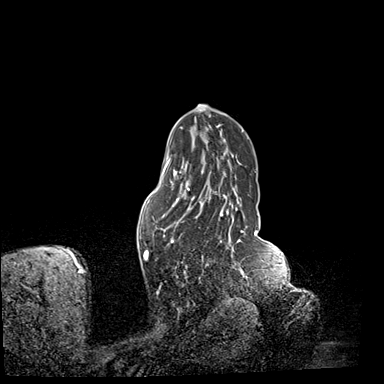
[im 144/144]
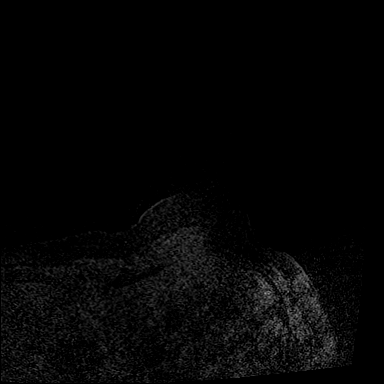

[Series 7: dynamic post 3 · axial · 1.3mm · 0.73mm/px · z∈[-84,+102]mm · 5 of 144 slices shown (2 of 2)]
[im 1/144]
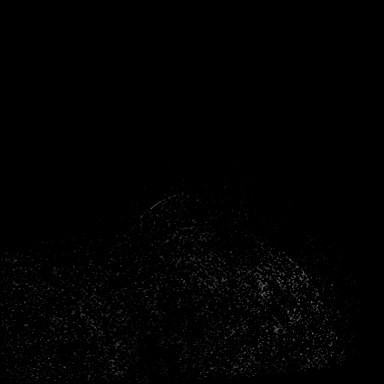
[im 36/144]
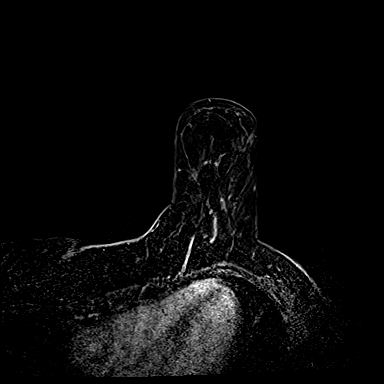
[im 72/144]
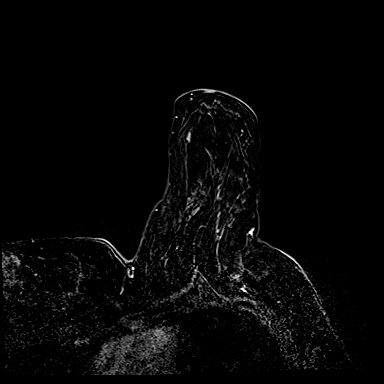
[im 108/144]
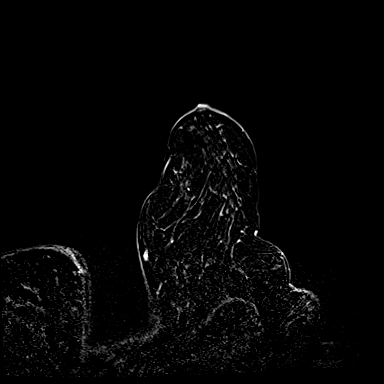
[im 144/144]
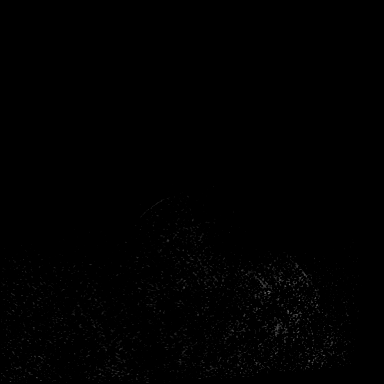

[Series 8: post bx · axial · 1.3mm · 0.73mm/px · z∈[-84,+102]mm · 5 of 144 slices shown]
[im 1/144]
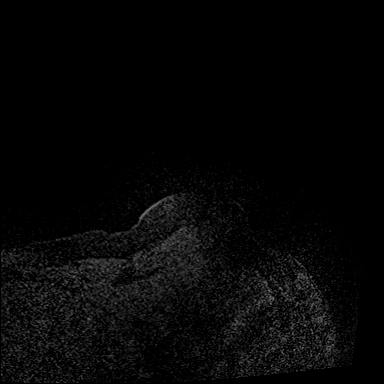
[im 36/144]
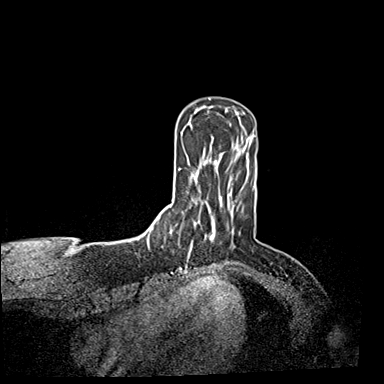
[im 72/144]
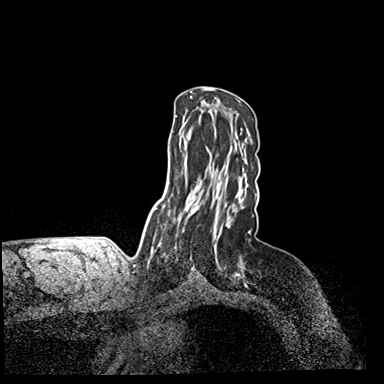
[im 108/144]
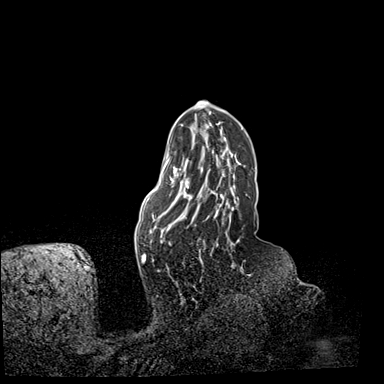
[im 144/144]
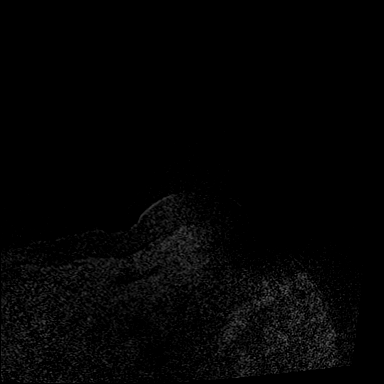

[33 of 48 positions shown; findings below may reference images not displayed]

FINDINGS: I met with the patient, and we discussed the procedure of MRI guided
biopsy, including risks, benefits, and alternatives. Specifically,
we discussed the risks of infection, bleeding, tissue injury, clip
migration, and inadequate sampling. Informed, written consent was
given. The usual time out protocol was performed immediately prior
to the procedure.

Using sterile technique, 1% Lidocaine, MRI guidance, and a 9 gauge
vacuum assisted device, biopsy was performed of non mass enhancement
in the upper outer left breast using a lateral approach. At the
conclusion of the procedure, a dumbbell tissue marker clip was
deployed into the biopsy cavity. Follow-up 2-view mammogram was
performed and dictated separately.
IMPRESSION: MRI guided biopsy of non mass enhancement in the upper outer left
breast. No apparent complications.

ADDENDUM:
Pathology revealed BENIGN NON-PROLIFERATIVE BREAST TISSUE of the
LEFT breast, upper outer, (dumbbell clip). This was found to be
concordant by Dr. KINGKINDER.

Pathology results were discussed with the patient by telephone. The
patient reported doing well after the biopsy with tenderness at the
site. Post biopsy instructions and care were reviewed and questions
were answered. The patient was encouraged to call The [REDACTED] for any additional concerns. My direct phone
number was provided.

The patient has a recent diagnosis of RIGHT breast cancer and should
follow her outlined treatment plan.

KINGKINDER and KINGKINDER, Oncology Nurse Navigators, at [HOSPITAL] [HOSPITAL] were notified of biopsy results via [REDACTED]
message on [DATE].

Pathology results reported by KINGKINDER, RN on [DATE].

*** End of Addendum ***
FINDINGS: I met with the patient, and we discussed the procedure of MRI guided
biopsy, including risks, benefits, and alternatives. Specifically,
we discussed the risks of infection, bleeding, tissue injury, clip
migration, and inadequate sampling. Informed, written consent was
given. The usual time out protocol was performed immediately prior
to the procedure.

Using sterile technique, 1% Lidocaine, MRI guidance, and a 9 gauge
vacuum assisted device, biopsy was performed of non mass enhancement
in the upper outer left breast using a lateral approach. At the
conclusion of the procedure, a dumbbell tissue marker clip was
deployed into the biopsy cavity. Follow-up 2-view mammogram was
performed and dictated separately.
IMPRESSION: MRI guided biopsy of non mass enhancement in the upper outer left
breast. No apparent complications.

## 2021-10-29 IMAGING — MG MM BREAST LOCALIZATION CLIP
4 series · 4 of 12 positions shown · non-contrast
Comparison: Previous exam(s).

CLINICAL DATA: Post procedure mammogram for clip placement.

EXAM:
3D DIAGNOSTIC LEFT MAMMOGRAM POST MRI BIOPSY

[L ML synth-2D]
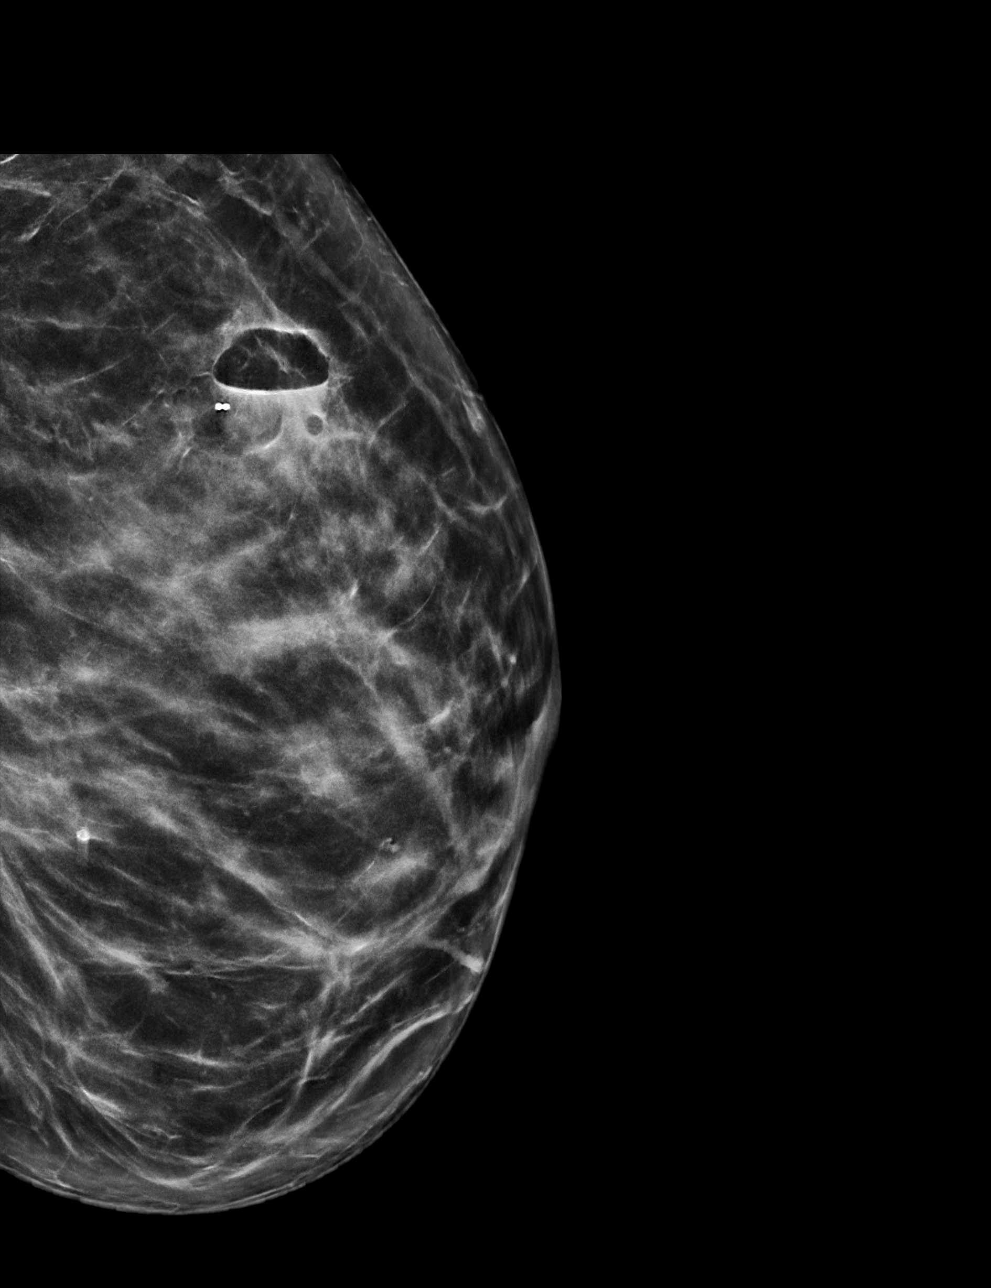

[L CC synth-2D]
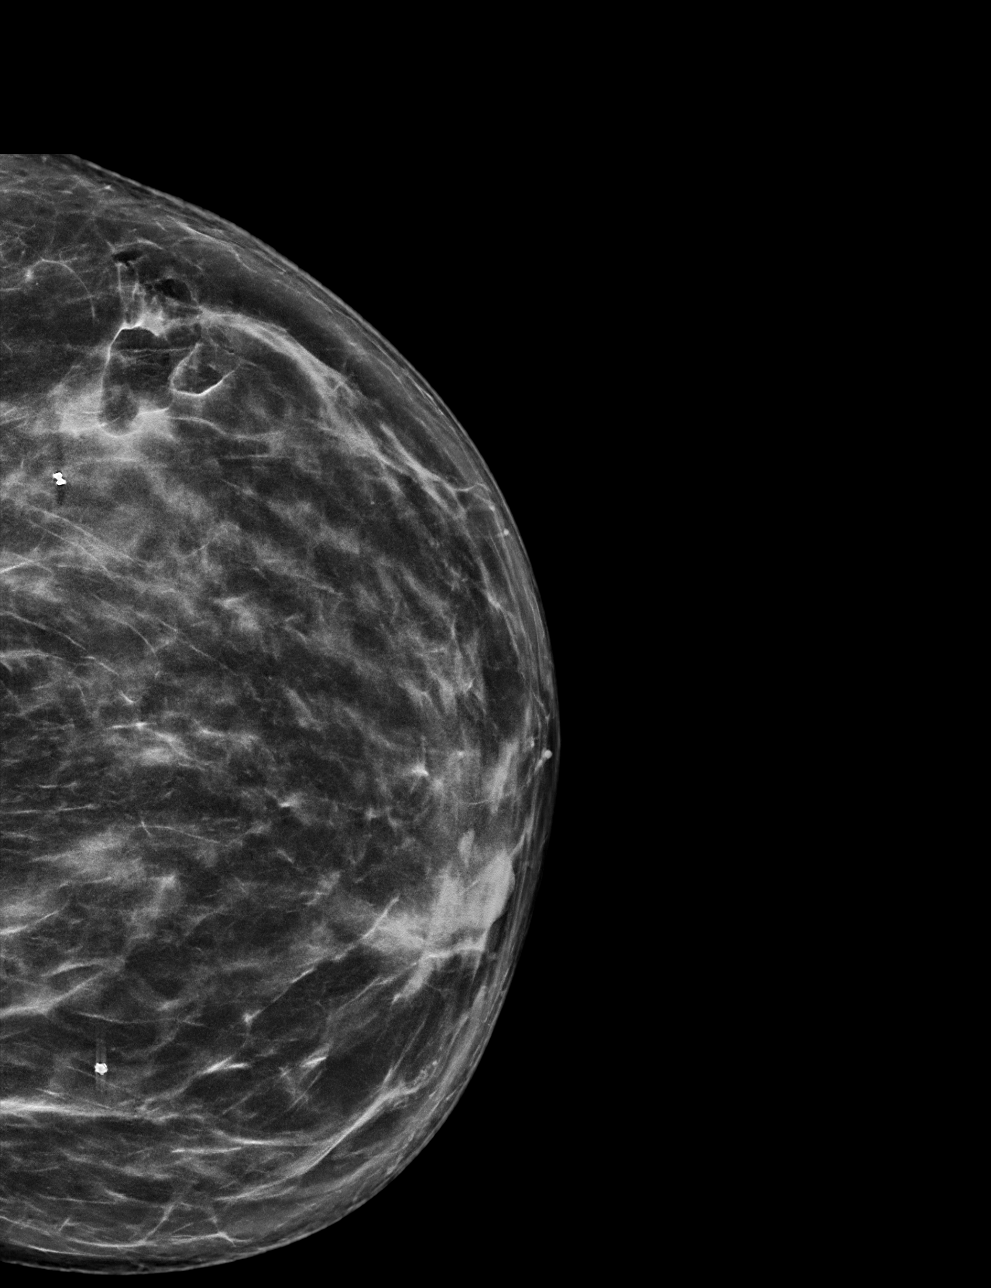

[L ML tomo · tomo slice 39/78.0]
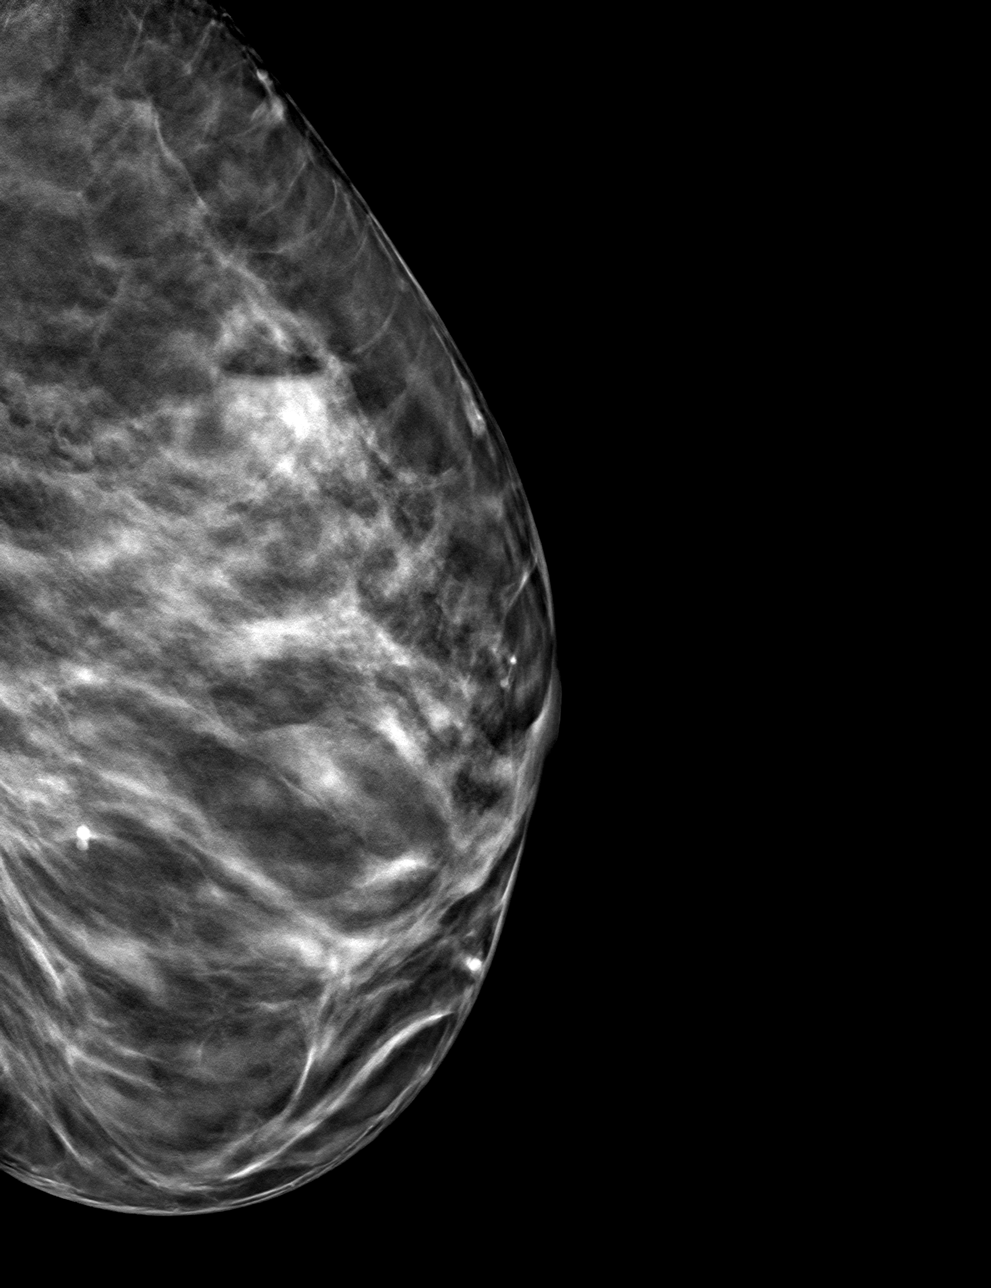

[L CC tomo · tomo slice 37/72.0]
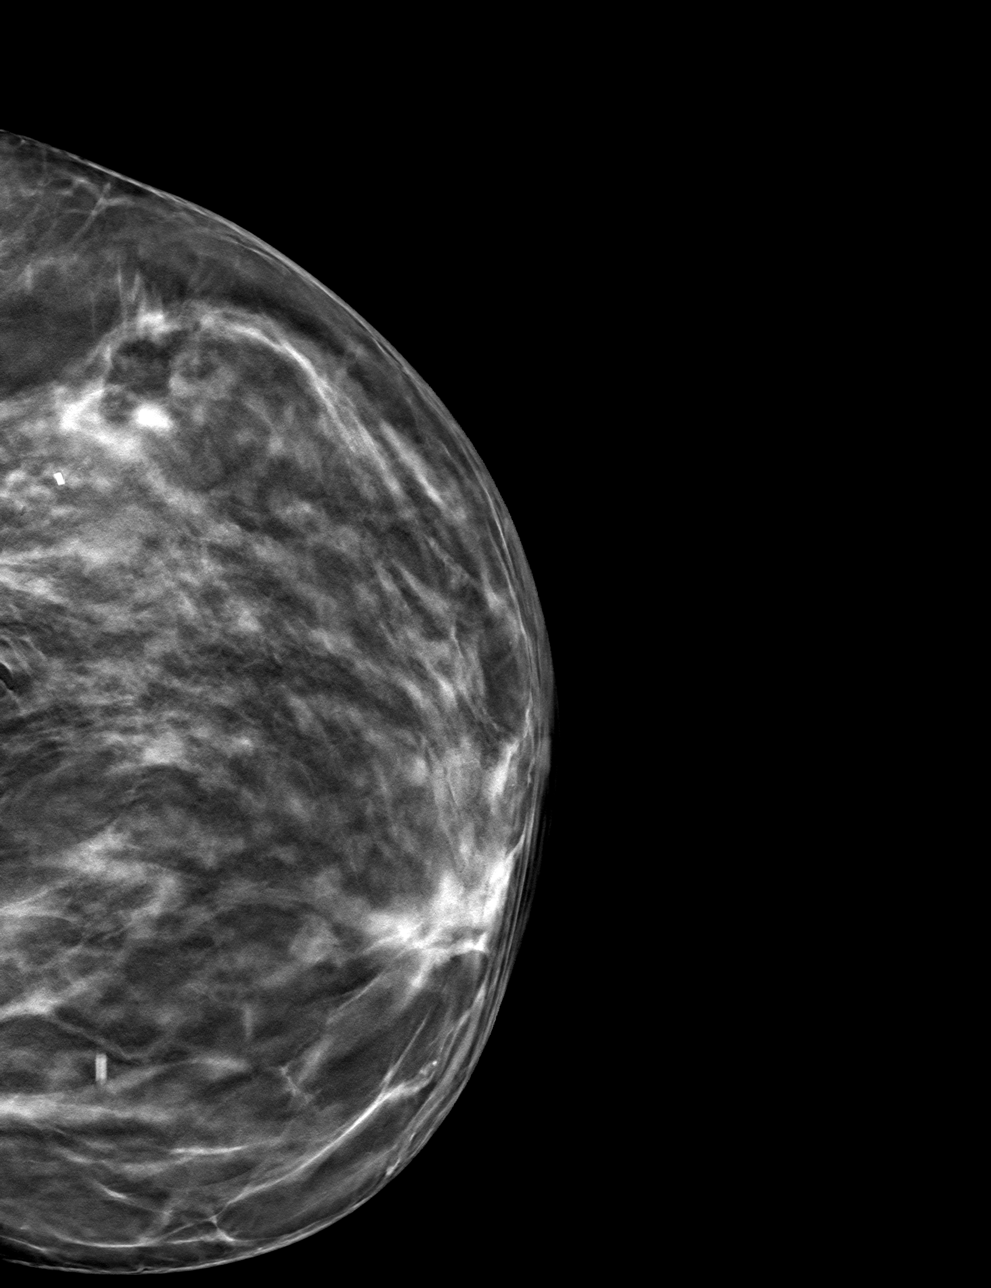

[4 of 12 positions shown; findings below may reference images not displayed]

FINDINGS: 3D Mammographic images were obtained following MRI guided biopsy of
non mass enhancement in the upper outer left breast. The biopsy
marking clip is in expected position at the site of biopsy.
IMPRESSION: Appropriate positioning of the dumbbell shaped biopsy marking clip
at the site of biopsy in the upper outer left breast.

Final Assessment: Post Procedure Mammograms for Marker Placement

## 2021-10-29 MED ORDER — GADOBUTROL 1 MMOL/ML IV SOLN: 5.0000 mL | Freq: Once | INTRAVENOUS | Status: DC | PRN

## 2021-10-29 NOTE — Telephone Encounter (Signed)
Monica [name] states that she is doing well. She had a H/Aand some body aches. Tried tylenol and was not effective.  She took an Oxycodone 5mg  from [facility] surgery and took a nap and is feeling much better. She is eating,drinking, and urinating well. Denies N/V. Throat dry. Suggested she use some throat lozenge. Pt knows to call the office at [phone] if she has any questions or concerns.

## 2021-10-29 NOTE — Telephone Encounter (Signed)
-----   Message from Teodoro Spray, RN sent at 10/28/2021  2:13 PM EST ----- Regarding: Monica [name] First time Adria/Cytoxan Dr. Lindi [name] pt first time adria/cytoxan on 10/28/21. Patient tolerate treatment well. Complained of "feeling to sneeze" and end of cytoxan, but denied headache/any other symptoms.  Thank you!

## 2021-10-30 ENCOUNTER — Other Ambulatory Visit: Payer: Self-pay

## 2021-10-30 ENCOUNTER — Inpatient Hospital Stay: Payer: Medicaid Other

## 2021-10-30 ENCOUNTER — Encounter: Payer: Self-pay | Admitting: *Deleted

## 2021-10-30 ENCOUNTER — Institutional Professional Consult (permissible substitution): Payer: Medicaid Other | Admitting: Plastic Surgery

## 2021-10-30 VITALS — BP 123/86 | Temp 98.4°F | Resp 81

## 2021-10-30 DIAGNOSIS — C50411 Malignant neoplasm of upper-outer quadrant of right female breast: Secondary | ICD-10-CM

## 2021-10-30 DIAGNOSIS — Z5111 Encounter for antineoplastic chemotherapy: Secondary | ICD-10-CM | POA: Diagnosis not present

## 2021-10-30 MED ORDER — PEGFILGRASTIM-CBQV 6 MG/0.6ML ~~LOC~~ SOSY
6.0000 mg | PREFILLED_SYRINGE | Freq: Once | SUBCUTANEOUS | Status: AC
  Administered 2021-10-30: 6 mg via SUBCUTANEOUS
  Filled 2021-10-30: qty 0.6

## 2021-11-01 ENCOUNTER — Encounter (HOSPITAL_BASED_OUTPATIENT_CLINIC_OR_DEPARTMENT_OTHER): Payer: Self-pay

## 2021-11-01 ENCOUNTER — Emergency Department (HOSPITAL_BASED_OUTPATIENT_CLINIC_OR_DEPARTMENT_OTHER): Payer: Medicaid Other

## 2021-11-01 ENCOUNTER — Emergency Department (HOSPITAL_BASED_OUTPATIENT_CLINIC_OR_DEPARTMENT_OTHER)
Admission: EM | Admit: 2021-11-01 | Discharge: 2021-11-02 | Disposition: A | Discharge status: Home / Self Care | Payer: Medicaid Other | Attending: Emergency Medicine | Admitting: Emergency Medicine

## 2021-11-01 ENCOUNTER — Other Ambulatory Visit: Payer: Self-pay

## 2021-11-01 DIAGNOSIS — R079 Chest pain, unspecified: Secondary | ICD-10-CM

## 2021-11-01 DIAGNOSIS — R0602 Shortness of breath: Secondary | ICD-10-CM | POA: Insufficient documentation

## 2021-11-01 DIAGNOSIS — R5383 Other fatigue: Secondary | ICD-10-CM | POA: Diagnosis not present

## 2021-11-01 DIAGNOSIS — R000 Tachycardia, unspecified: Secondary | ICD-10-CM | POA: Insufficient documentation

## 2021-11-01 DIAGNOSIS — R072 Precordial pain: Secondary | ICD-10-CM | POA: Insufficient documentation

## 2021-11-01 DIAGNOSIS — R059 Cough, unspecified: Secondary | ICD-10-CM | POA: Diagnosis not present

## 2021-11-01 DIAGNOSIS — Z20822 Contact with and (suspected) exposure to covid-19: Secondary | ICD-10-CM | POA: Insufficient documentation

## 2021-11-01 DIAGNOSIS — R0781 Pleurodynia: Secondary | ICD-10-CM

## 2021-11-01 LAB — CBC WITH DIFFERENTIAL/PLATELET
Abs Immature Granulocytes: 3.03 10*3/uL — ABNORMAL HIGH (ref 0.00–0.07)
Basophils Absolute: 0.1 10*3/uL (ref 0.0–0.1)
Basophils Relative: 1 %
Eosinophils Absolute: 0.1 10*3/uL (ref 0.0–0.5)
Eosinophils Relative: 1 %
HCT: 35.3 % — ABNORMAL LOW (ref 36.0–46.0)
Hemoglobin: 12.6 g/dL (ref 12.0–15.0)
Immature Granulocytes: 22 %
Lymphocytes Relative: 11 %
Lymphs Abs: 1.5 10*3/uL (ref 0.7–4.0)
MCH: 32.5 pg (ref 26.0–34.0)
MCHC: 35.7 g/dL (ref 30.0–36.0)
MCV: 91 fL (ref 80.0–100.0)
Monocytes Absolute: 0 10*3/uL — ABNORMAL LOW (ref 0.1–1.0)
Monocytes Relative: 0 %
Neutro Abs: 8.9 10*3/uL — ABNORMAL HIGH (ref 1.7–7.7)
Neutrophils Relative %: 65 %
Platelets: 116 10*3/uL — ABNORMAL LOW (ref 150–400)
RBC: 3.88 MIL/uL (ref 3.87–5.11)
RDW: 11.2 % — ABNORMAL LOW (ref 11.5–15.5)
Smear Review: NORMAL
WBC: 13.7 10*3/uL — ABNORMAL HIGH (ref 4.0–10.5)
nRBC: 0 % (ref 0.0–0.2)

## 2021-11-01 LAB — COMPREHENSIVE METABOLIC PANEL
ALT: 6 U/L (ref 0–44)
AST: 12 U/L — ABNORMAL LOW (ref 15–41)
Albumin: 4.1 g/dL (ref 3.5–5.0)
Alkaline Phosphatase: 42 U/L (ref 38–126)
Anion gap: 8 (ref 5–15)
BUN: 13 mg/dL (ref 6–20)
CO2: 26 mmol/L (ref 22–32)
Calcium: 9.1 mg/dL (ref 8.9–10.3)
Chloride: 101 mmol/L (ref 98–111)
Creatinine, Ser: 0.54 mg/dL (ref 0.44–1.00)
GFR, Estimated: >60 mL/min (ref >60)
Glucose, Bld: 111 mg/dL — ABNORMAL HIGH (ref 70–99)
Potassium: 3.3 mmol/L — ABNORMAL LOW (ref 3.5–5.1)
Sodium: 135 mmol/L (ref 135–145)
Total Bilirubin: 0.5 mg/dL (ref 0.3–1.2)
Total Protein: 6.7 g/dL (ref 6.5–8.1)

## 2021-11-01 LAB — TROPONIN I (HIGH SENSITIVITY): Troponin I (High Sensitivity): <2 ng/L (ref <18)

## 2021-11-01 LAB — RESP PANEL BY RT-PCR (FLU A&B, COVID) ARPGX2
Influenza A by PCR: NEGATIVE
Influenza B by PCR: NEGATIVE
SARS Coronavirus 2 by RT PCR: NEGATIVE

## 2021-11-01 LAB — LACTIC ACID, PLASMA: Lactic Acid, Venous: 0.9 mmol/L (ref 0.5–1.9)

## 2021-11-01 IMAGING — CT CT ANGIO CHEST
2 of 9 series · 18 of 36 positions shown · IV contrast (Omnipaque)
Comparison: [DATE], [DATE]

CLINICAL DATA: Severe pleuritic chest discomfort and shortness of
breath, breast cancer currently undergoing chemotherapy, chest pain
and shortness of breath for 2 days

EXAM:
CT ANGIOGRAPHY CHEST WITH CONTRAST
TECHNIQUE: Multidetector CT imaging of the chest was performed using the
standard protocol during bolus administration of intravenous
contrast. Multiplanar CT image reconstructions and MIPs were
obtained to evaluate the vascular anatomy.

[Series 7: pe coronal mpr · coronal · 0.57mm/px · 1 of 122 slices shown]
[im 61/122  mediastinal]
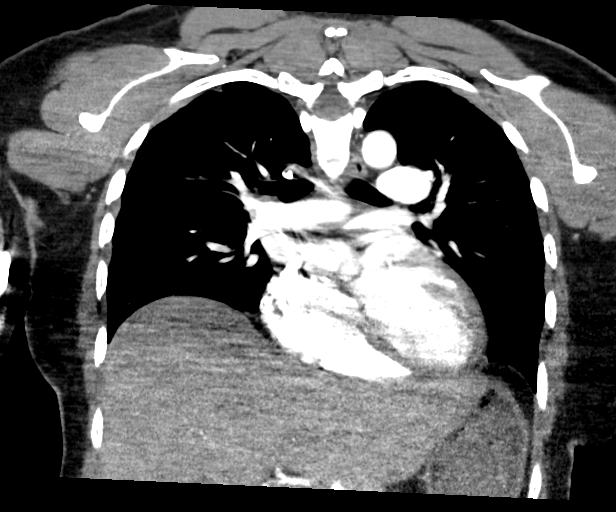

[Series 11: pe thins · axial · 0.72mm/px · z∈[-237,+4]mm · 17 of 271 slices shown]
[im 15/271  lung]
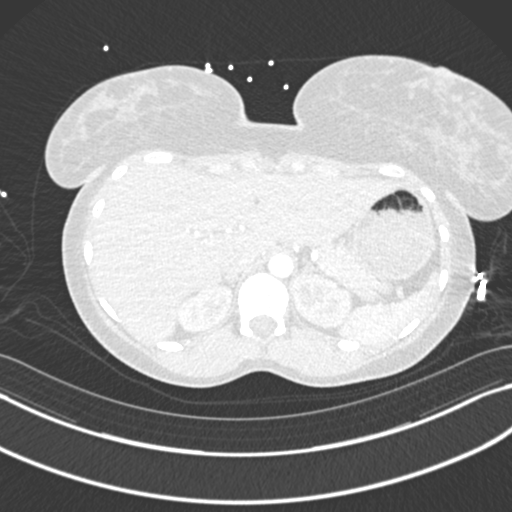
[im 29/271  mediastinal]
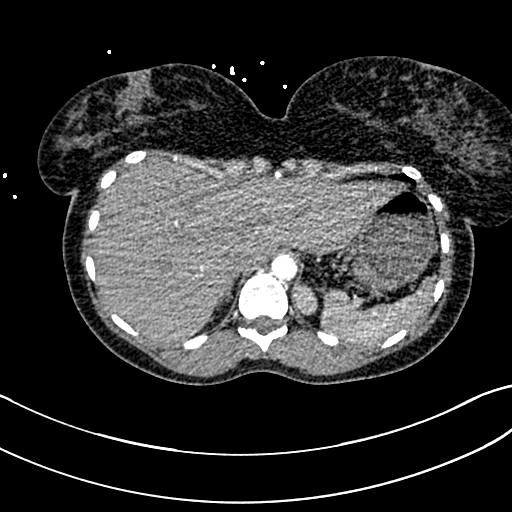
[im 43/271  lung]
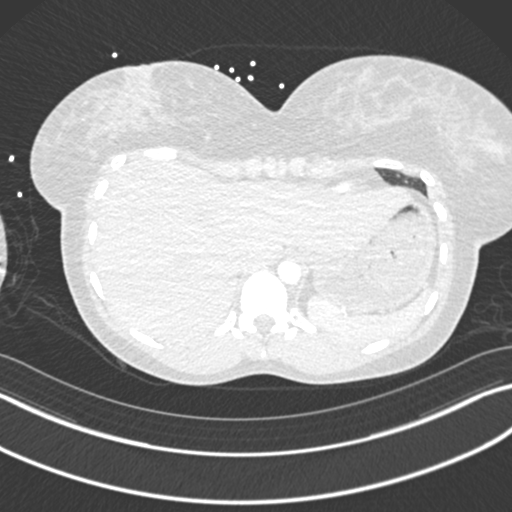
[im 57/271  mediastinal]
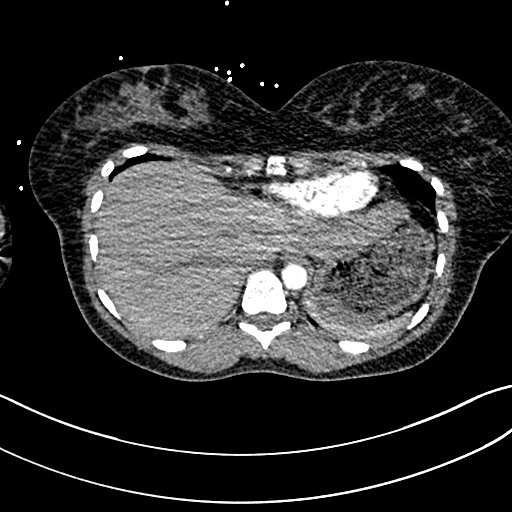
[im 72/271  lung]
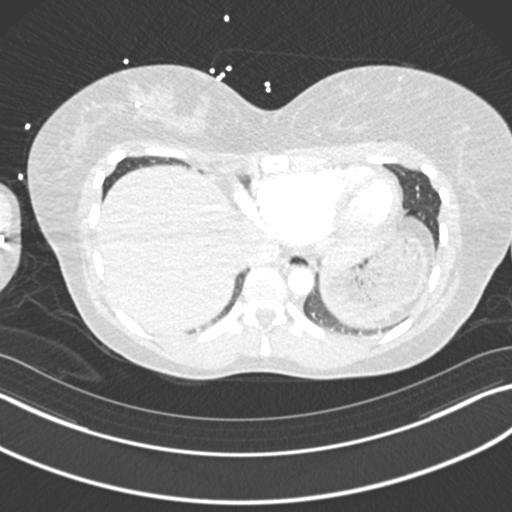
[im 86/271  mediastinal]
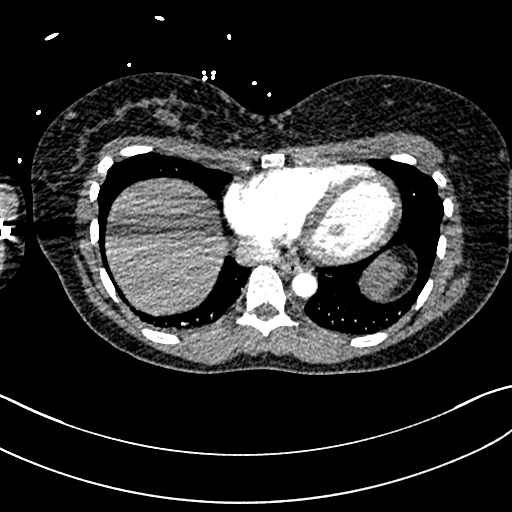
[im 100/271  lung]
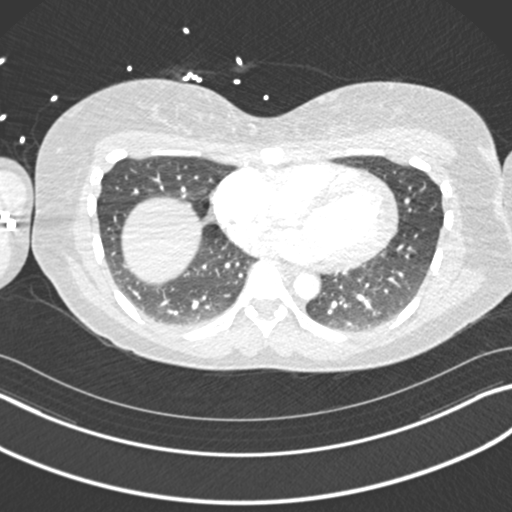
[im 114/271  mediastinal]
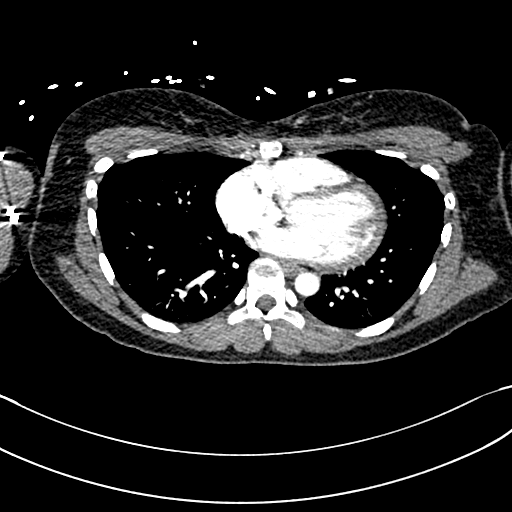
[im 143/271  lung]
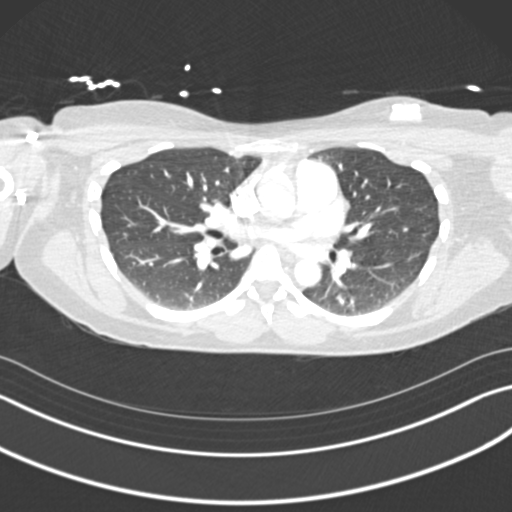
[im 157/271  mediastinal]
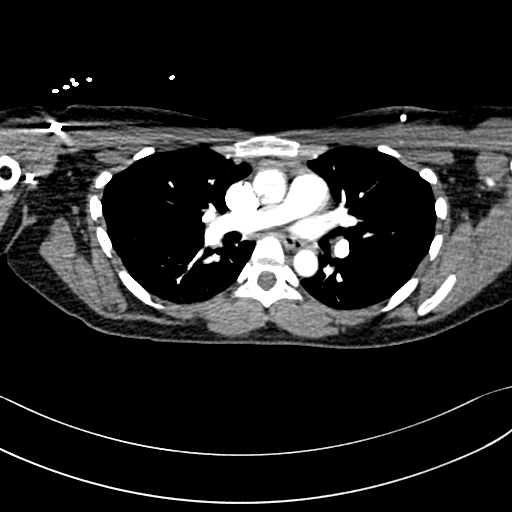
[im 171/271  lung]
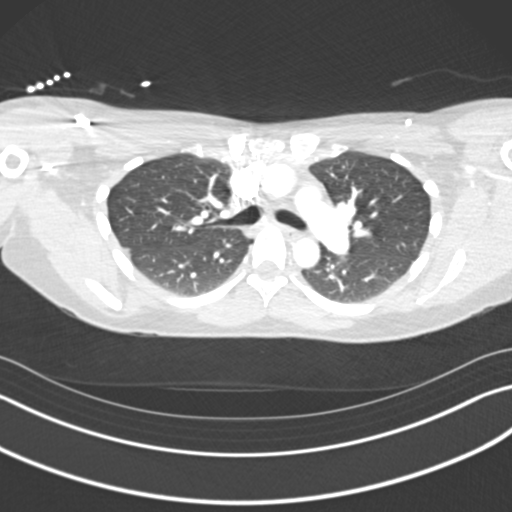
[im 185/271  mediastinal]
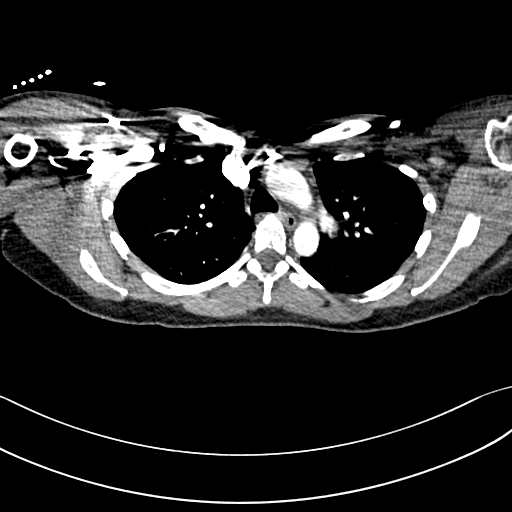
[im 199/271  lung]
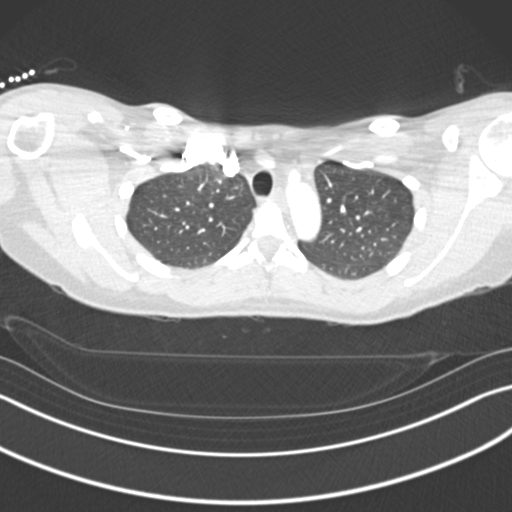
[im 214/271  mediastinal]
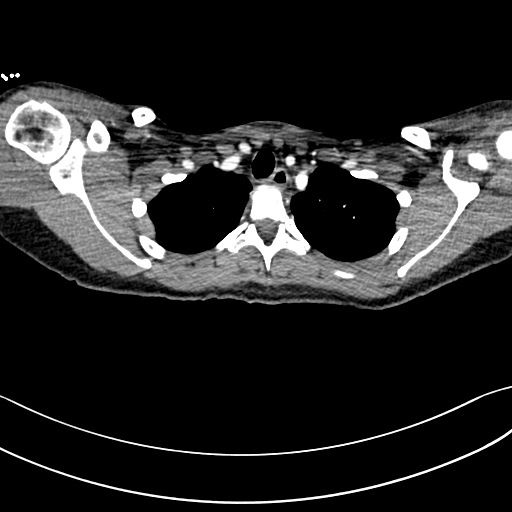
[im 228/271  lung]
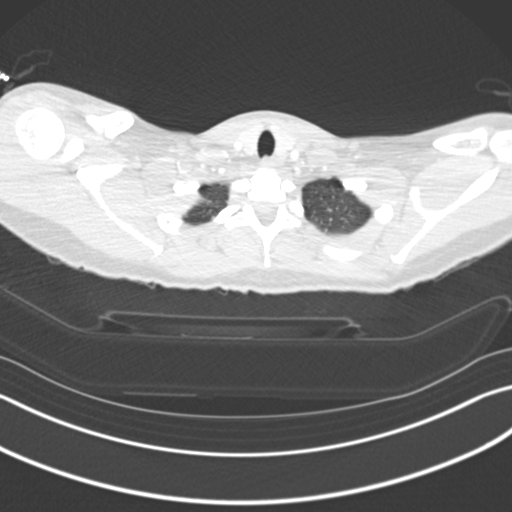
[im 242/271  mediastinal]
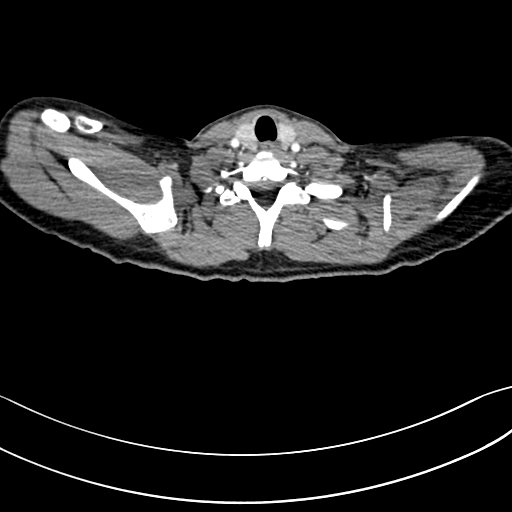
[im 256/271  lung]
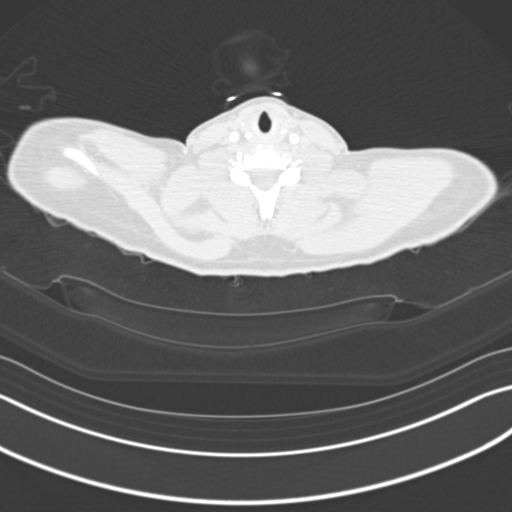

[18 of 36 positions shown; findings below may reference images not displayed]

RADIATION DOSE REDUCTION: This exam was performed according to the
departmental dose-optimization program which includes automated
exposure control, adjustment of the mA and/or kV according to
patient size and/or use of iterative reconstruction technique.

CONTRAST:  100mL OMNIPAQUE IOHEXOL 350 MG/ML SOLN
FINDINGS: Cardiovascular: This is a technically adequate evaluation of the
pulmonary vasculature. No filling defects or pulmonary emboli.

The heart is unremarkable without pericardial effusion. No evidence
of thoracic aortic aneurysm or dissection.

Left chest wall port via subclavian approach, tip within the
superior vena cava.

Mediastinum/Nodes: No enlarged mediastinal, hilar, or axillary lymph
nodes. Thyroid gland, trachea, and esophagus demonstrate no
significant findings.

Lungs/Pleura: No acute airspace disease, effusion, or pneumothorax.
Central airways are patent.

Upper Abdomen: No acute abnormality.

Musculoskeletal: No acute or destructive bony lesions. Reconstructed
images demonstrate no additional findings.

Metallic clip central upper right breast at the site of known breast
cancer. Metallic clip central outer left breast with adjacent soft
tissue gas consistent with recent biopsy of masslike enhancement.
Please refer to biopsy results.

Review of the MIP images confirms the above findings.
IMPRESSION: 1. No evidence of pulmonary embolus.
2. No acute intrathoracic process.
3. Postsurgical changes within the bilateral breasts, please refer
to recent MRI report describing right breast cancer and masslike
enhancement left breast.

## 2021-11-01 MED ORDER — IOHEXOL 350 MG/ML SOLN
100.0000 mL | Freq: Once | INTRAVENOUS | Status: AC | PRN
  Administered 2021-11-01: 100 mL via INTRAVENOUS

## 2021-11-01 NOTE — ED Provider Notes (Signed)
11:35 PM Assumed care from Dr. Sherry Ruffing, please see their note for full history, physical and decision making until this point. In brief this is a 26 y.o. year old female who presented to the ED tonight with Chest Pain     Chest wall pain. Pleuritic. Tachy. Ca. Rule out PE/PNA/infection.   CT/Trop negative. Pain treated. Stable for discharge with onc/PCP follow up PRN.   Discharge instructions, including strict return precautions for new or worsening symptoms, given. Patient and/or family verbalized understanding and agreement with the plan as described.   Labs, studies and imaging reviewed by myself and considered in medical decision making if ordered. Imaging interpreted by radiology.  Labs Reviewed  CBC WITH DIFFERENTIAL/PLATELET - Abnormal; Notable for the following components:      Result Value   WBC 13.7 (*)    HCT 35.3 (*)    RDW 11.2 (*)    Platelets 116 (*)    Neutro Abs 8.9 (*)    Monocytes Absolute 0.0 (*)    Abs Immature Granulocytes 3.03 (*)    All other components within normal limits  COMPREHENSIVE METABOLIC PANEL - Abnormal; Notable for the following components:   Potassium 3.3 (*)    Glucose, Bld 111 (*)    AST 12 (*)    All other components within normal limits  RESP PANEL BY RT-PCR (FLU A&B, COVID) ARPGX2  LACTIC ACID, PLASMA  TROPONIN I (HIGH SENSITIVITY)    CT Angio Chest PE W and/or Wo Contrast    (Results Pending)    No follow-ups on file.    Merrily Pew, MD 11/02/21 [phone]

## 2021-11-01 NOTE — ED Provider Notes (Signed)
[facility] EMERGENCY DEPARTMENT Provider Note   CSN: [id number] Arrival date & time: 11/01/21  2139     History  Chief Complaint  Patient presents with   Chest Pain    Monica [name] is a 26 y.o. female.  The history is provided by the patient, the spouse and medical records. No language interpreter was used.  Chest Pain Pain location:  Substernal area, L chest and R chest Pain quality: aching, sharp and tightness   Pain radiates to:  Does not radiate Pain severity:  Severe Onset quality:  Sudden Duration:  2 days Timing:  Constant Progression:  Waxing and waning Chronicity:  New Context: breathing   Relieved by:  Nothing Worsened by:  Coughing, deep breathing and movement Ineffective treatments:  None tried Associated symptoms: cough (dry), fatigue and shortness of breath   Associated symptoms: no abdominal pain, no altered mental status, no back pain, no dizziness, no fever, no headache, no lower extremity edema, no nausea, no numbness, no palpitations, no vomiting and no weakness   Risk factors: not female       Home Medications Prior to Admission medications   Medication Sig Start Date End Date Taking? Authorizing Provider  dexamethasone (DECADRON) 4 MG tablet Take 1 tablet (4 mg total) by mouth daily. Take 1 tablet day after chemo and 1 tablet 2 days after chemo with food Patient not taking: Reported on 10/28/2021 10/16/21   [name], MD  gabapentin (NEURONTIN) 100 MG capsule Take 100 mg by mouth daily. Patient not taking: Reported on 10/28/2021 10/24/21   [provider]  lidocaine-prilocaine (EMLA) cream Apply to affected area once Patient not taking: Reported on 10/28/2021 10/16/21   [name], MD  LORazepam (ATIVAN) 0.5 MG tablet Take 1 tablet (0.5 mg total) by mouth at bedtime as needed for sleep. Patient not taking: Reported on 10/28/2021 10/16/21   [name], MD  naproxen (NAPROSYN) 500 MG tablet Take 500 mg by mouth 2 (two) times  daily. Patient not taking: Reported on 10/28/2021 10/24/21   [provider]  ondansetron (ZOFRAN) 8 MG tablet Take 1 tablet (8 mg total) by mouth 2 (two) times daily as needed. Start on the third day after chemotherapy. Patient not taking: Reported on 10/28/2021 10/16/21   [name], MD  oxyCODONE (OXY IR/ROXICODONE) 5 MG immediate release tablet Take 1 tablet (5 mg total) by mouth every 6 (six) hours as needed for severe pain. Patient not taking: Reported on 10/28/2021 10/27/21   Stark Klein, MD  prochlorperazine (COMPAZINE) 10 MG tablet Take 1 tablet (10 mg total) by mouth every 6 (six) hours as needed (Nausea or vomiting). Patient not taking: Reported on 10/28/2021 10/16/21   [name], MD  VENTOLIN HFA 108 [phone] Base) MCG/ACT inhaler SMARTSIG:2 Puff(s) By Mouth Every 4 Hours PRN Patient not taking: Reported on 10/28/2021 08/28/21   [provider]      Allergies    Patient has no known allergies.    Review of Systems   Review of Systems  Constitutional:  Positive for fatigue. Negative for chills and fever.  HENT:  Negative for congestion.   Respiratory:  Positive for cough (dry), chest tightness and shortness of breath.   Cardiovascular:  Positive for chest pain. Negative for palpitations.  Gastrointestinal:  Negative for abdominal pain, constipation, diarrhea, nausea and vomiting.  Genitourinary:  Negative for dysuria.  Musculoskeletal:  Negative for back pain and neck pain.  Skin:  Negative for rash and wound.  Neurological:  Negative for dizziness, weakness, numbness and headaches.  Psychiatric/Behavioral:  Negative for agitation and confusion.   All other systems reviewed and are negative.  Physical Exam Updated Vital Signs BP 122/84 (BP Location: Right Arm)    Pulse 85    Temp 97.9 F (36.6 C) (Oral)    Resp 16    Ht 5\' 6"  (1.676 m)    Wt 61.7 kg    LMP  (LMP Unknown) Comment: Few years ago   SpO2 100%    BMI 21.95 kg/m  Physical Exam Vitals and nursing  note reviewed.  Constitutional:      General: She is not in acute distress.    Appearance: She is well-developed. She is not ill-appearing, toxic-appearing or diaphoretic.  HENT:     Head: Normocephalic and atraumatic.     Nose: No congestion or rhinorrhea.     Mouth/Throat:     Mouth: Mucous membranes are moist.  Eyes:     Extraocular Movements: Extraocular movements intact.     Conjunctiva/sclera: Conjunctivae normal.     Pupils: Pupils are equal, round, and reactive to light.  Cardiovascular:     Rate and Rhythm: Regular rhythm. Tachycardia present.     Heart sounds: No murmur heard. Pulmonary:     Effort: Pulmonary effort is normal. Tachypnea present. No respiratory distress.     Breath sounds: Normal breath sounds. No decreased breath sounds, wheezing, rhonchi or rales.  Chest:     Chest wall: Tenderness present.  Abdominal:     General: Abdomen is flat.     Palpations: Abdomen is soft.     Tenderness: There is no abdominal tenderness. There is no guarding or rebound.  Musculoskeletal:        General: No swelling.     Cervical back: Neck supple. No tenderness.     Right lower leg: No tenderness. No edema.     Left lower leg: No tenderness. No edema.  Skin:    General: Skin is warm and dry.     Capillary Refill: Capillary refill takes less than 2 seconds.     Findings: No erythema.  Neurological:     Mental Status: She is alert.  Psychiatric:        Mood and Affect: Mood normal.    ED Results / Procedures / Treatments   Labs (all labs ordered are listed, but only abnormal results are displayed) Labs Reviewed  COMPREHENSIVE METABOLIC PANEL - Abnormal; Notable for the following components:      Result Value   Potassium 3.3 (*)    Glucose, Bld 111 (*)    AST 12 (*)    All other components within normal limits  RESP PANEL BY RT-PCR (FLU A&B, COVID) ARPGX2  LACTIC ACID, PLASMA  CBC WITH DIFFERENTIAL/PLATELET  LACTIC ACID, PLASMA  TROPONIN I (HIGH SENSITIVITY)     EKG EKG Interpretation  Date/Time:  Sunday November 01 2021 21:52:46 EST Ventricular Rate:  81 PR Interval:  140 QRS Duration: 82 QT Interval:  342 QTC Calculation: 397 R Axis:   46 Text Interpretation: Normal sinus rhythm Nonspecific T wave abnormality Abnormal ECG When compared with ECG of 18-Apr-2019 21:57, PREVIOUS ECG IS PRESENT When compared to prior, more artifact. No STEMI Confirmed by Antony Blackbird [id number]) on 11/01/2021 11:30:00 PM  Radiology No results found.  Procedures Procedures    Medications Ordered in ED Medications  iohexol (OMNIPAQUE) 350 MG/ML injection 100 mL (100 mLs Intravenous Contrast Given 11/01/21 2323)    ED Course/ Medical  Decision Making/ A&P                           Medical Decision Making   Monica [name] is a 26 y.o. female with a past medical history significant for breast cancer currently getting chemotherapy who presents with 2 days of severe chest pain and shortness of breath.  According to patient, for the last 2 days she has had severe and pleuritic chest discomfort.  She reports has had some dry cough but no fevers or chills.  She reports shortness of breath that she cannot catch her breath.  She denies any trauma.  She reports no nausea, vomiting, constipation, diarrhea, or urinary symptoms.  She reports that the pain is a 7 out of 10 currently but has been up to 8 or 9 out of 10.  She wanted to make sure she did not have some complication from recent chemotherapy, pneumonia with a cough, or any other complication or problem such as blood clot.  She denies history of DVT, PE, and no family history of this.  On exam, lungs were clear and chest was tender to palpation diffusely.  Abdomen was nontender.  Good pulses in extremities.  Legs are nontender and nonedematous.  On my initial evaluation, she is tachycardic and tachypneic but is maintaining oxygen saturations on room air.  She is not febrile.  She has a left recent port placement  with some tenderness but no evidence of erythema or drainage.  EKG did not show STEMI.  Given her cancer diagnosis with extremely severe pleuritic chest pain and shortness of breath, cough, and symptoms we felt it was reasonable to get a CT PE study to rule out occult pneumonia, pulmonary embolism, or other postprocedural complication.  We will also get a COVID swab due to the coughing and get troponins and other labs.  Patient did not want pain medicine initially and wants to wait for the results.  Care transferred to oncoming team awaiting for results of CT PE study and lab work-up.  If work-up is reassuring, suspect symptoms and chest wall discomfort may be related to recent port placement and musculoskeletal pains and she can follow-up with PCP.  Care transferred in stable condition.            Final Clinical Impression(s) / ED Diagnoses Final diagnoses:  Pleuritic chest pain    Clinical Impression: 1. Pleuritic chest pain     Disposition: Patient awaiting results of work-up prior to reassessment.  Anticipate disposition based on findings.  This note was prepared with assistance of Systems analyst. Occasional wrong-word or sound-a-like substitutions may have occurred due to the inherent limitations of voice recognition software.      Jayveon Convey, Gwenyth Allegra, MD 11/01/21 2337

## 2021-11-01 NOTE — ED Triage Notes (Signed)
Pt arrives with c/o bad chest pain states that she had a porta cath placed on Tuesday for breast cancer. Reports CP and SOB starting 2 days ago.

## 2021-11-02 LAB — TROPONIN I (HIGH SENSITIVITY): Troponin I (High Sensitivity): 2 ng/L (ref <18)

## 2021-11-02 MED ORDER — OXYCODONE-ACETAMINOPHEN 5-325 MG PO TABS
2.0000 | ORAL_TABLET | Freq: Once | ORAL | Status: AC
Start: 2021-11-02 — End: 2021-11-02
  Administered 2021-11-02: 2 via ORAL
  Filled 2021-11-02: qty 2

## 2021-11-02 NOTE — ED Notes (Signed)
Patient discharged to home.  All discharge instructions reviewed.  Patient verbalized understanding via teachback method.  VS WDL.  Respirations even and unlabored.  Ambulatory out of ED.   °

## 2021-11-03 NOTE — Progress Notes (Signed)
Patient Care Team: Default, Provider, MD as PCP - General [name], RN as Oncology Nurse Navigator [name], RN as Oncology Nurse Navigator  DIAGNOSIS:    ICD-10-CM   1. Malignant neoplasm of upper-outer quadrant of right breast in female, estrogen receptor positive ([place])  C50.411    Z17.0       SUMMARY OF ONCOLOGIC HISTORY: Oncology History  Malignant neoplasm of upper-outer quadrant of right breast in female, estrogen receptor positive ([place])  10/16/2021 Initial Diagnosis   Malignant neoplasm of upper-outer quadrant of right breast in female, estrogen receptor positive ([place])   10/16/2021 Cancer Staging   Staging form: Breast, AJCC 8th Edition - Clinical stage from 10/16/2021: Stage IIA (cT3, cN1, cM0, G2, ER+, PR+, HER2: Equivocal) - Signed by [name], MD on 10/16/2021 Histologic grading system: 3 grade system    10/28/2021 -  Chemotherapy   Patient is on Treatment Plan : BREAST ADJUVANT DOSE DENSE AC q14d / PACLitaxel q7d       CHIEF COMPLIANT: Cycle 1 day 8 dose dense Adriamycin and Cytoxan  INTERVAL HISTORY: Monica [name] is a 26 y.o. with above-mentioned history of  ER/PR positive right breast cancer. She presents to the clinic today for follow-up.  She received first cycle of chemotherapy last week and has felt reasonably well.  Her major complaints are related to mild nausea and some drowsiness and dizziness that comes on after chemo She is primarily complaining of vertigo as if the room is spinning around her down.  ALLERGIES:  has No Known Allergies.  MEDICATIONS:  Current Outpatient Medications  Medication Sig Dispense Refill   dexamethasone (DECADRON) 4 MG tablet Take 1 tablet (4 mg total) by mouth daily. Take 1 tablet day after chemo and 1 tablet 2 days after chemo with food (Patient not taking: Reported on 10/28/2021) 8 tablet 0   gabapentin (NEURONTIN) 100 MG capsule Take 100 mg by mouth daily. (Patient not taking: Reported on 10/28/2021)      lidocaine-prilocaine (EMLA) cream Apply to affected area once (Patient not taking: Reported on 10/28/2021) 30 g 3   LORazepam (ATIVAN) 0.5 MG tablet Take 1 tablet (0.5 mg total) by mouth at bedtime as needed for sleep. (Patient not taking: Reported on 10/28/2021) 30 tablet 0   naproxen (NAPROSYN) 500 MG tablet Take 500 mg by mouth 2 (two) times daily. (Patient not taking: Reported on 10/28/2021)     ondansetron (ZOFRAN) 8 MG tablet Take 1 tablet (8 mg total) by mouth 2 (two) times daily as needed. Start on the third day after chemotherapy. (Patient not taking: Reported on 10/28/2021) 30 tablet 1   oxyCODONE (OXY IR/ROXICODONE) 5 MG immediate release tablet Take 1 tablet (5 mg total) by mouth every 6 (six) hours as needed for severe pain. (Patient not taking: Reported on 10/28/2021) 5 tablet 0   prochlorperazine (COMPAZINE) 10 MG tablet Take 1 tablet (10 mg total) by mouth every 6 (six) hours as needed (Nausea or vomiting). (Patient not taking: Reported on 10/28/2021) 30 tablet 1   VENTOLIN HFA 108 (90 Base) MCG/ACT inhaler SMARTSIG:2 Puff(s) By Mouth Every 4 Hours PRN (Patient not taking: Reported on 10/28/2021)     No current facility-administered medications for this visit.    PHYSICAL EXAMINATION: ECOG PERFORMANCE STATUS: 1 - Symptomatic but completely ambulatory  Vitals:   11/04/21 1051  BP: 115/82  Pulse: 86  Resp: 18  Temp: 97.7 F (36.5 C)  SpO2: 100%   Filed Weights   11/04/21 1051  Weight: 137 lb 3 oz (62.2 kg)      LABORATORY DATA:  I have reviewed the data as listed CMP Latest Ref Rng & Units 11/04/2021 11/01/2021 10/27/2021  Glucose 70 - 99 mg/dL 96 111(H) 94  BUN 6 - 20 mg/dL 10 13 13   Creatinine 0.44 - 1.00 mg/dL 0.61 0.54 0.84  Sodium 135 - 145 mmol/L 136 135 139  Potassium 3.5 - 5.1 mmol/L 3.9 3.3(L) 3.9  Chloride 98 - 111 mmol/L 103 101 107  CO2 22 - 32 mmol/L 26 26 26   Calcium 8.9 - 10.3 mg/dL 10.1 9.1 9.3  Total Protein 6.5 - 8.1 g/dL 7.4 6.7 6.7  Total Bilirubin  0.3 - 1.2 mg/dL 0.5 0.5 0.5  Alkaline Phos 38 - 126 U/L 40 42 31(L)  AST 15 - 41 U/L 10(L) 12(L) 13(L)  ALT 0 - 44 U/L 5 6 5     Lab Results  Component Value Date   WBC 1.7 (L) 11/04/2021   HGB 12.5 11/04/2021   HCT 36.8 11/04/2021   MCV 90.6 11/04/2021   PLT 81 (L) 11/04/2021   NEUTROABS PENDING 11/04/2021    ASSESSMENT & PLAN:  Malignant neoplasm of upper-outer quadrant of right breast in female, estrogen receptor positive ([place]) receptor positive ([place]) 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  Neoadjuvant chemotherapy with dose dense Adriamycin and Cytoxan followed by Taxol weekly x12   2. Mastectomy versus lumpectomy with targeted node dissection 3.  Adjuvant radiation 4.  Followed by adjuvant antiestrogen therapy (ovarian suppression) along with CDK 4 and 6 inhibitor 5.  Genetic testing -------------------------------------------------------------------------------------------------------------   Current Treatment:  Cycle 1 day 8 Adriamycin and Cytoxan started 10/28/2018 CT chest angio: No PE, no mets Bone scan 11/06/21 MRI guided biopsies 10/29/21: Left breast bx: Benign    Chemo Toxicities: Mild nausea BPV: I will refer her to physical therapy Leukopenia: We will reduce the second cycle of chemo Thrombocytopenia: Chemo induced RTC in 1 week for cycle 2    No orders of the defined types were placed in this encounter.  The patient has a good understanding of the overall plan. she agrees with it. she will call with any problems that may develop before the next visit here.  Total time spent: 30  mins including face to face time and time spent for planning, charting and coordination of care  Rulon Eisenmenger, MD, MPH 11/04/2021  I, Thana Ates, am acting as scribe for Dr. [name].  I have reviewed the above documentation for accuracy and completeness, and I agree with the above.

## 2021-11-04 ENCOUNTER — Inpatient Hospital Stay: Payer: Medicaid Other | Admitting: Hematology and Oncology

## 2021-11-04 ENCOUNTER — Other Ambulatory Visit: Payer: Self-pay | Admitting: *Deleted

## 2021-11-04 ENCOUNTER — Other Ambulatory Visit: Payer: Self-pay

## 2021-11-04 ENCOUNTER — Encounter: Payer: Self-pay | Admitting: Hematology and Oncology

## 2021-11-04 ENCOUNTER — Inpatient Hospital Stay: Payer: Medicaid Other

## 2021-11-04 ENCOUNTER — Encounter: Payer: Self-pay | Admitting: *Deleted

## 2021-11-04 DIAGNOSIS — H8113 Benign paroxysmal vertigo, bilateral: Secondary | ICD-10-CM

## 2021-11-04 DIAGNOSIS — C50411 Malignant neoplasm of upper-outer quadrant of right female breast: Secondary | ICD-10-CM | POA: Diagnosis not present

## 2021-11-04 DIAGNOSIS — Z170 Estrogen receptor positive status [ER+]: Secondary | ICD-10-CM | POA: Diagnosis not present

## 2021-11-04 DIAGNOSIS — Z95828 Presence of other vascular implants and grafts: Secondary | ICD-10-CM

## 2021-11-04 DIAGNOSIS — Z5111 Encounter for antineoplastic chemotherapy: Secondary | ICD-10-CM | POA: Diagnosis not present

## 2021-11-04 HISTORY — DX: Presence of other vascular implants and grafts: Z95.828

## 2021-11-04 LAB — CBC WITH DIFFERENTIAL (CANCER CENTER ONLY)
Abs Immature Granulocytes: 0.02 10*3/uL (ref 0.00–0.07)
Basophils Absolute: 0 10*3/uL (ref 0.0–0.1)
Basophils Relative: 2 %
Eosinophils Absolute: 0 10*3/uL (ref 0.0–0.5)
Eosinophils Relative: 2 %
HCT: 36.8 % (ref 36.0–46.0)
Hemoglobin: 12.5 g/dL (ref 12.0–15.0)
Immature Granulocytes: 1 %
Lymphocytes Relative: 57 %
Lymphs Abs: 1 10*3/uL (ref 0.7–4.0)
MCH: 30.8 pg (ref 26.0–34.0)
MCHC: 34 g/dL (ref 30.0–36.0)
MCV: 90.6 fL (ref 80.0–100.0)
Monocytes Absolute: 0.2 10*3/uL (ref 0.1–1.0)
Monocytes Relative: 10 %
Neutro Abs: 0.5 10*3/uL — ABNORMAL LOW (ref 1.7–7.7)
Neutrophils Relative %: 28 %
Platelet Count: 81 10*3/uL — ABNORMAL LOW (ref 150–400)
RBC: 4.06 MIL/uL (ref 3.87–5.11)
RDW: 10.7 % — ABNORMAL LOW (ref 11.5–15.5)
WBC Count: 1.7 10*3/uL — ABNORMAL LOW (ref 4.0–10.5)
nRBC: 0 % (ref 0.0–0.2)

## 2021-11-04 LAB — CMP (CANCER CENTER ONLY)
ALT: 5 U/L (ref 0–44)
AST: 10 U/L — ABNORMAL LOW (ref 15–41)
Albumin: 4.7 g/dL (ref 3.5–5.0)
Alkaline Phosphatase: 40 U/L (ref 38–126)
Anion gap: 7 (ref 5–15)
BUN: 10 mg/dL (ref 6–20)
CO2: 26 mmol/L (ref 22–32)
Calcium: 10.1 mg/dL (ref 8.9–10.3)
Chloride: 103 mmol/L (ref 98–111)
Creatinine: 0.61 mg/dL (ref 0.44–1.00)
GFR, Estimated: >60 mL/min (ref >60)
Glucose, Bld: 96 mg/dL (ref 70–99)
Potassium: 3.9 mmol/L (ref 3.5–5.1)
Sodium: 136 mmol/L (ref 135–145)
Total Bilirubin: 0.5 mg/dL (ref 0.3–1.2)
Total Protein: 7.4 g/dL (ref 6.5–8.1)

## 2021-11-04 LAB — PREGNANCY, URINE: Preg Test, Ur: NEGATIVE

## 2021-11-04 MED ORDER — HEPARIN SOD (PORK) LOCK FLUSH 100 UNIT/ML IV SOLN
500.0000 [IU] | Freq: Once | INTRAVENOUS | Status: AC
  Administered 2021-11-04: 500 [IU]

## 2021-11-04 MED ORDER — SODIUM CHLORIDE 0.9% FLUSH
10.0000 mL | Freq: Once | INTRAVENOUS | Status: AC
  Administered 2021-11-04: 10 mL

## 2021-11-04 NOTE — Progress Notes (Signed)
Per MD request referral placed to PT for evaluation and tx of BPV. RN placed call to cancer center rehab and confirmed referral has been received and they will schedule pt next available.

## 2021-11-04 NOTE — Assessment & Plan Note (Signed)
receptor positive ([place]) 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,FISH negative  Treatment plan: 1.Neoadjuvant chemotherapy with dose dense Adriamycin and Cytoxan followed by Taxol weekly x12  2.Mastectomyversus lumpectomy with targeted node dissection 3.Adjuvant radiation 4.Followed by adjuvant antiestrogen therapy (ovarian suppression) along with CDK 4 and 6 inhibitor 5.Genetic testing -------------------------------------------------------------------------------------------------------------  Current Treatment:  Cycle 1 day 8 Adriamycin and Cytoxan started 10/28/2018 CT chest angio: No PE, no mets Bone scan 11/06/21 MRI guided biopsies 10/29/21: Left breast bx: Benign   Chemo Toxicities:  RTC in 1 week for cycle 2

## 2021-11-05 ENCOUNTER — Emergency Department (HOSPITAL_COMMUNITY): Payer: Medicaid Other

## 2021-11-05 ENCOUNTER — Emergency Department (HOSPITAL_COMMUNITY)
Admission: EM | Admit: 2021-11-05 | Discharge: 2021-11-05 | Disposition: A | Discharge status: Home / Self Care | Payer: Medicaid Other | Attending: Emergency Medicine | Admitting: Emergency Medicine

## 2021-11-05 ENCOUNTER — Ambulatory Visit: Payer: Medicaid Other | Admitting: Rehabilitation

## 2021-11-05 ENCOUNTER — Encounter (HOSPITAL_COMMUNITY): Payer: Self-pay | Admitting: Emergency Medicine

## 2021-11-05 DIAGNOSIS — M545 Low back pain, unspecified: Secondary | ICD-10-CM | POA: Insufficient documentation

## 2021-11-05 DIAGNOSIS — C50411 Malignant neoplasm of upper-outer quadrant of right female breast: Secondary | ICD-10-CM | POA: Insufficient documentation

## 2021-11-05 DIAGNOSIS — M546 Pain in thoracic spine: Secondary | ICD-10-CM | POA: Diagnosis not present

## 2021-11-05 DIAGNOSIS — R0789 Other chest pain: Secondary | ICD-10-CM | POA: Insufficient documentation

## 2021-11-05 DIAGNOSIS — N9489 Other specified conditions associated with female genital organs and menstrual cycle: Secondary | ICD-10-CM | POA: Insufficient documentation

## 2021-11-05 DIAGNOSIS — R350 Frequency of micturition: Secondary | ICD-10-CM | POA: Diagnosis not present

## 2021-11-05 DIAGNOSIS — Z170 Estrogen receptor positive status [ER+]: Secondary | ICD-10-CM

## 2021-11-05 LAB — CBC WITH DIFFERENTIAL/PLATELET
Abs Immature Granulocytes: 0.32 10*3/uL — ABNORMAL HIGH (ref 0.00–0.07)
Basophils Absolute: 0 10*3/uL (ref 0.0–0.1)
Basophils Relative: 1 %
Eosinophils Absolute: 0.1 10*3/uL (ref 0.0–0.5)
Eosinophils Relative: 1 %
HCT: 36.9 % (ref 36.0–46.0)
Hemoglobin: 13 g/dL (ref 12.0–15.0)
Immature Granulocytes: 9 %
Lymphocytes Relative: 36 %
Lymphs Abs: 1.3 10*3/uL (ref 0.7–4.0)
MCH: 32.2 pg (ref 26.0–34.0)
MCHC: 35.2 g/dL (ref 30.0–36.0)
MCV: 91.3 fL (ref 80.0–100.0)
Monocytes Absolute: 0.4 10*3/uL (ref 0.1–1.0)
Monocytes Relative: 12 %
Neutro Abs: 1.5 10*3/uL — ABNORMAL LOW (ref 1.7–7.7)
Neutrophils Relative %: 41 %
Platelets: 89 10*3/uL — ABNORMAL LOW (ref 150–400)
RBC: 4.04 MIL/uL (ref 3.87–5.11)
RDW: 10.7 % — ABNORMAL LOW (ref 11.5–15.5)
WBC: 3.5 10*3/uL — ABNORMAL LOW (ref 4.0–10.5)
nRBC: 0 % (ref 0.0–0.2)

## 2021-11-05 LAB — COMPREHENSIVE METABOLIC PANEL
ALT: 9 U/L (ref 0–44)
AST: 15 U/L (ref 15–41)
Albumin: 4.7 g/dL (ref 3.5–5.0)
Alkaline Phosphatase: 41 U/L (ref 38–126)
Anion gap: 9 (ref 5–15)
BUN: 11 mg/dL (ref 6–20)
CO2: 24 mmol/L (ref 22–32)
Calcium: 9.8 mg/dL (ref 8.9–10.3)
Chloride: 103 mmol/L (ref 98–111)
Creatinine, Ser: 0.55 mg/dL (ref 0.44–1.00)
GFR, Estimated: >60 mL/min (ref >60)
Glucose, Bld: 106 mg/dL — ABNORMAL HIGH (ref 70–99)
Potassium: 3.9 mmol/L (ref 3.5–5.1)
Sodium: 136 mmol/L (ref 135–145)
Total Bilirubin: 0.5 mg/dL (ref 0.3–1.2)
Total Protein: 7.7 g/dL (ref 6.5–8.1)

## 2021-11-05 LAB — URINALYSIS, ROUTINE W REFLEX MICROSCOPIC
Bilirubin Urine: NEGATIVE
Glucose, UA: NEGATIVE mg/dL
Hgb urine dipstick: NEGATIVE
Ketones, ur: NEGATIVE mg/dL
Leukocytes,Ua: NEGATIVE
Nitrite: NEGATIVE
Protein, ur: NEGATIVE mg/dL
Specific Gravity, Urine: 1.013 (ref 1.005–1.030)
pH: 6 (ref 5.0–8.0)

## 2021-11-05 LAB — I-STAT BETA HCG BLOOD, ED (MC, WL, AP ONLY): I-stat hCG, quantitative: <5 m[IU]/mL (ref <5)

## 2021-11-05 LAB — LIPASE, BLOOD: Lipase: 28 U/L (ref 11–51)

## 2021-11-05 IMAGING — CT CT ANGIO CHEST
2 of 7 series · 19 of 46 positions shown · IV contrast (agent unspecified)
Comparison: CTA of the chest on [DATE]

CLINICAL DATA: Back pain radiating into chest. Recent workup and
diagnosis right breast carcinoma.

EXAM:
CT ANGIOGRAPHY CHEST WITH CONTRAST
TECHNIQUE: Multidetector CT imaging of the chest was performed using the
standard protocol during bolus administration of intravenous
contrast. Multiplanar CT image reconstructions and MIPs were
obtained to evaluate the vascular anatomy.

[Series 5: thins · axial · 0.70mm/px · z∈[+1149,+1402]mm · 16 of 287 slices shown]
[im 17/287  lung]
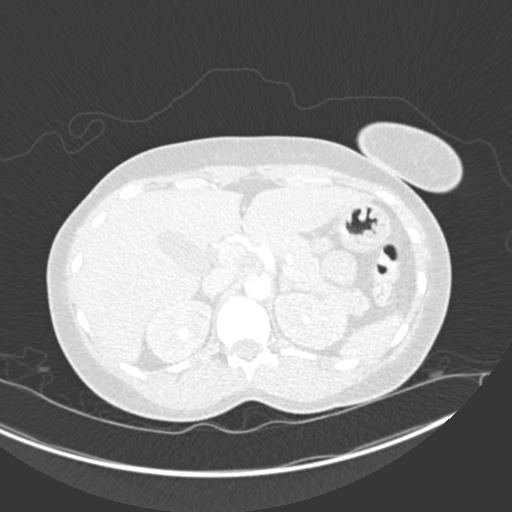
[im 34/287  soft-tissue]
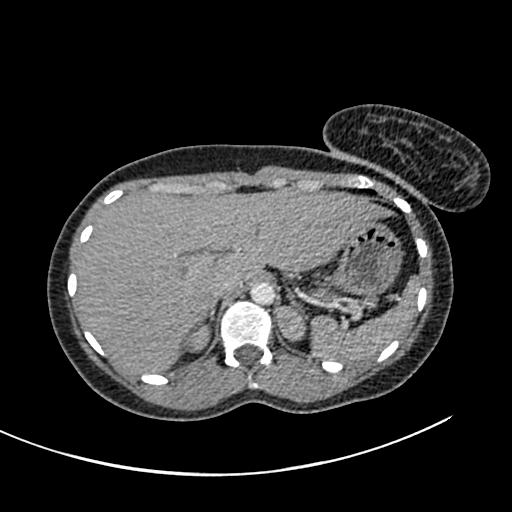
[im 51/287  lung]
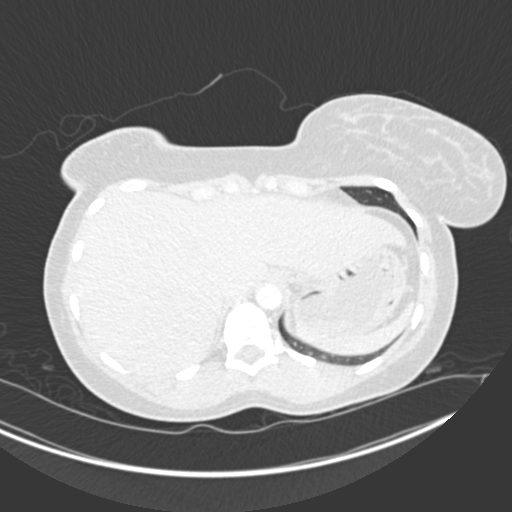
[im 68/287  soft-tissue]
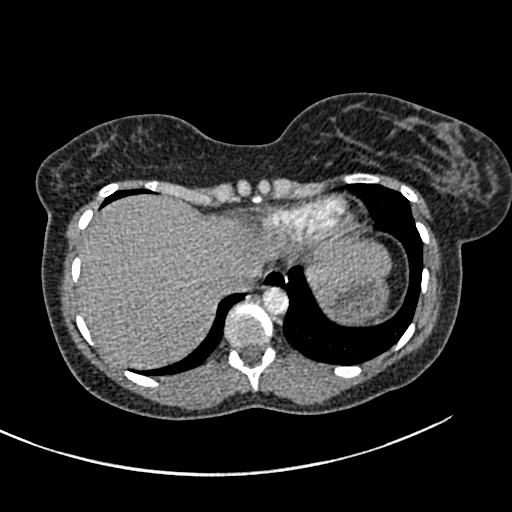
[im 85/287  lung]
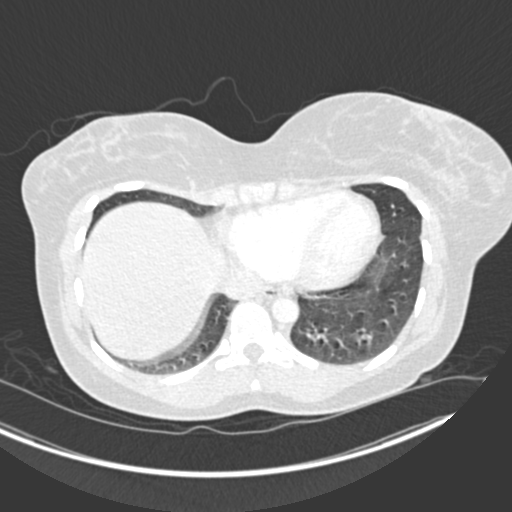
[im 101/287  soft-tissue]
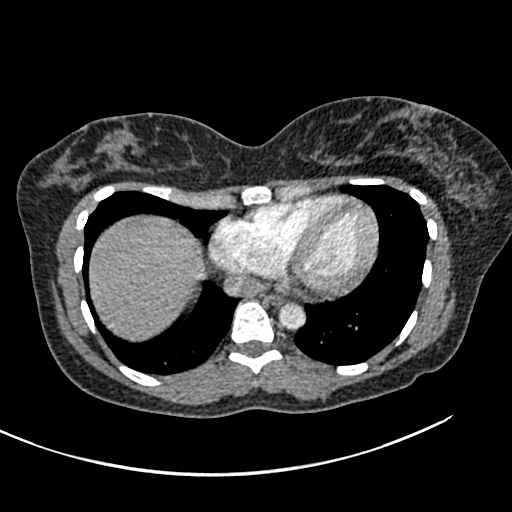
[im 118/287  lung]
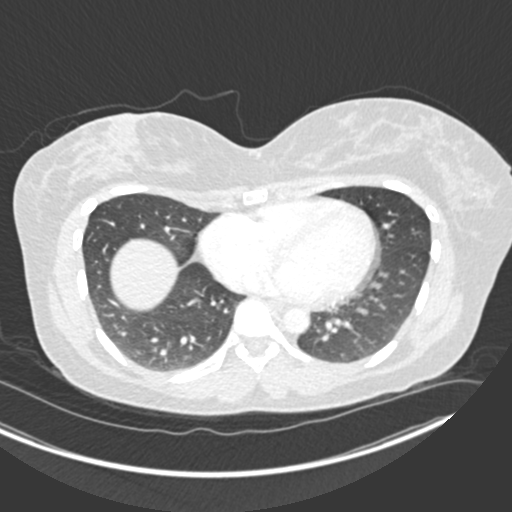
[im 135/287  soft-tissue]
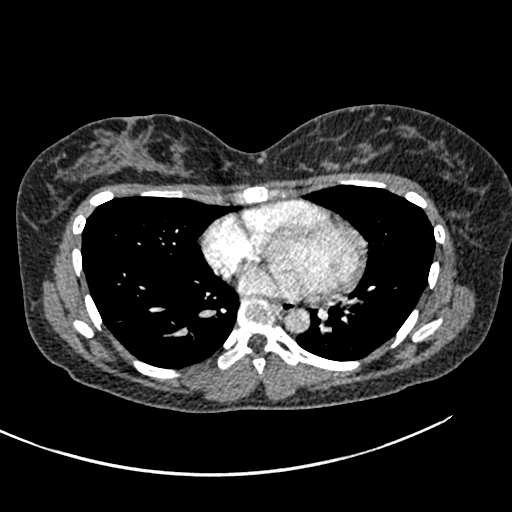
[im 152/287  lung]
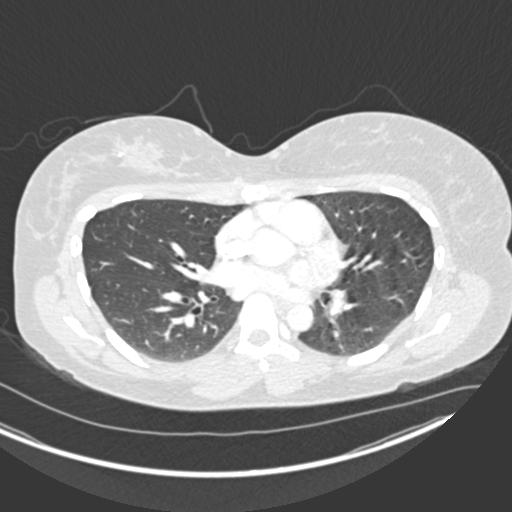
[im 169/287  soft-tissue]
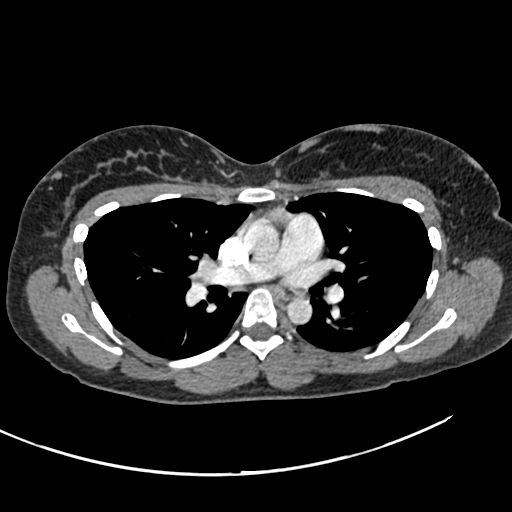
[im 186/287  lung]
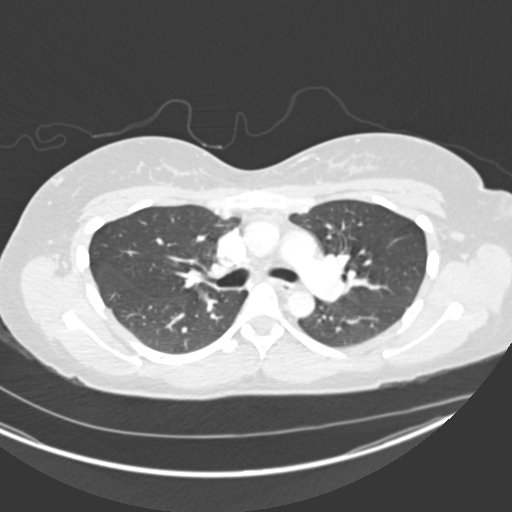
[im 202/287  soft-tissue]
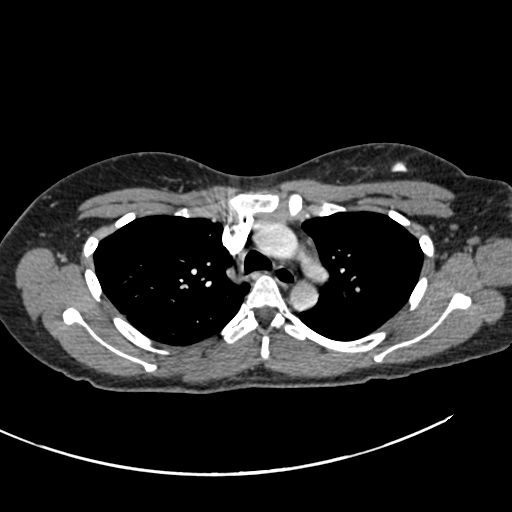
[im 219/287  lung]
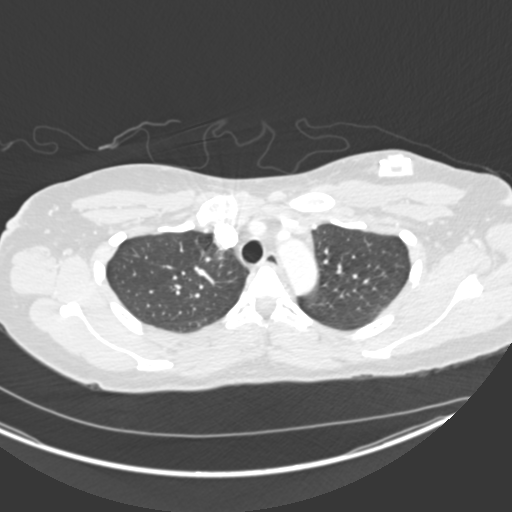
[im 236/287  soft-tissue]
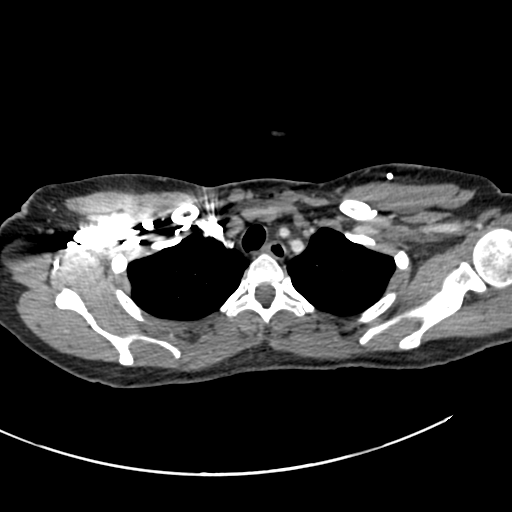
[im 253/287  lung]
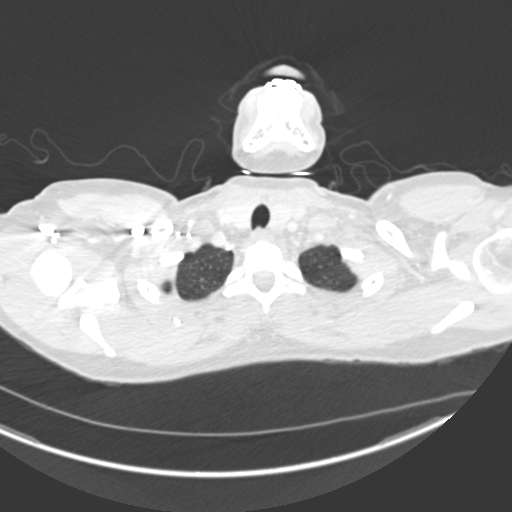
[im 270/287  soft-tissue]
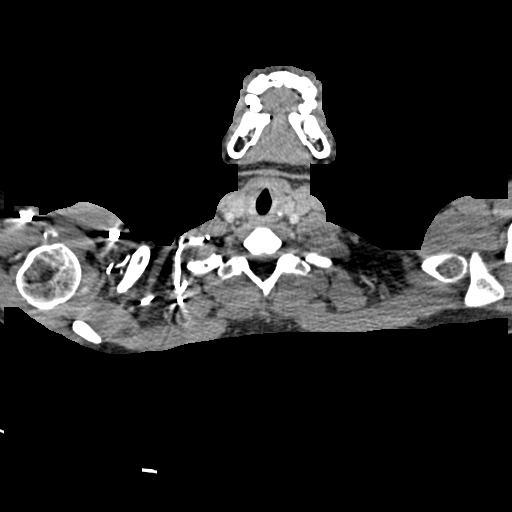

[Series 7: coronal mpr · coronal · 0.68mm/px · 3 of 77 slices shown]
[im 20/77  soft-tissue]
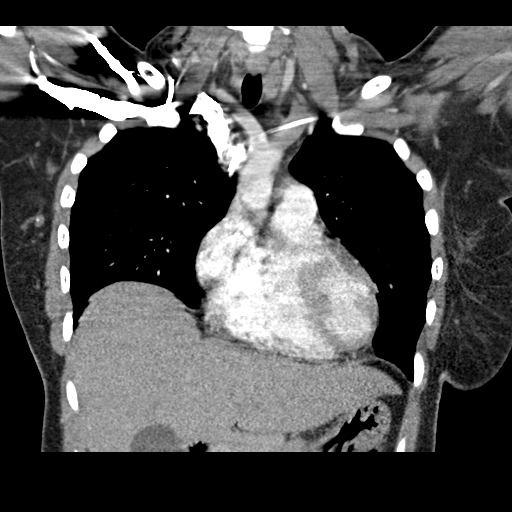
[im 39/77  soft-tissue]
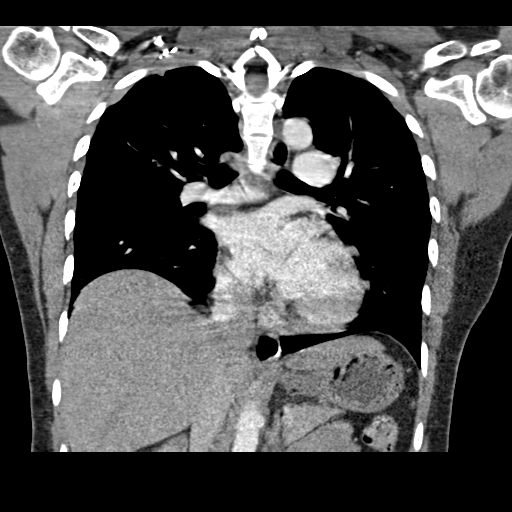
[im 58/77  soft-tissue]
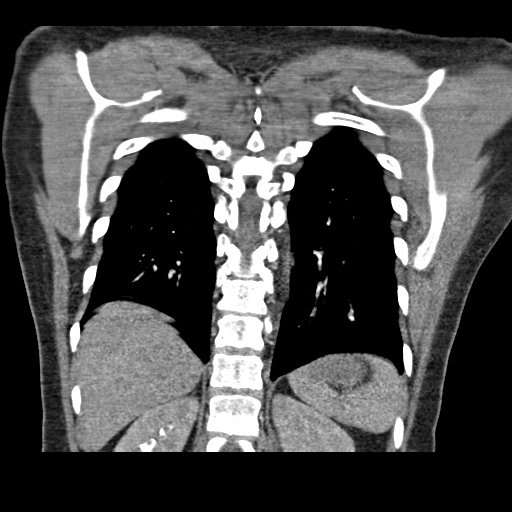

[19 of 46 positions shown; findings below may reference images not displayed]

RADIATION DOSE REDUCTION: This exam was performed according to the
departmental dose-optimization program which includes automated
exposure control, adjustment of the mA and/or kV according to
patient size and/or use of iterative reconstruction technique.

CONTRAST:  100mL OMNIPAQUE IOHEXOL 350 MG/ML SOLN
FINDINGS: Cardiovascular: Pulmonary arteries are adequately opacified. There
is no evidence of pulmonary embolism. Central pulmonary arteries are
normal in caliber. The thoracic aorta is also well opacified and
demonstrates normal caliber without evidence of aneurysmal disease,
dissection or atherosclerosis. Visualized proximal great vessels
demonstrate normal patency.

The heart size is normal. No pericardial fluid identified. No
visible calcified coronary artery plaque. Stable appearance of
left-sided Port-A-Cath.

Mediastinum/Nodes: No enlarged mediastinal, hilar, or axillary lymph
nodes. Thyroid gland, trachea, and esophagus demonstrate no
significant findings.

Lungs/Pleura: There is no evidence of pulmonary edema,
consolidation, pneumothorax, nodule or pleural fluid.

Upper Abdomen: No acute abnormality.

Musculoskeletal: No chest wall abnormality. No acute or significant
osseous findings. Biopsy clip again noted in the right breast.

Review of the MIP images confirms the above findings.
IMPRESSION: Normal CTA of the chest.  No acute findings.

## 2021-11-05 MED ORDER — HYDROMORPHONE HCL 1 MG/ML IJ SOLN
1.0000 mg | Freq: Once | INTRAMUSCULAR | Status: AC
  Administered 2021-11-05: 1 mg via INTRAVENOUS
  Filled 2021-11-05: qty 1

## 2021-11-05 MED ORDER — SODIUM CHLORIDE (PF) 0.9 % IJ SOLN
INTRAMUSCULAR | Status: AC
  Filled 2021-11-05: qty 50

## 2021-11-05 MED ORDER — OXYCODONE HCL 5 MG PO TABS
5.0000 mg | ORAL_TABLET | ORAL | 0 refills | Status: DC | PRN
Start: 2021-11-05 — End: 2021-12-11

## 2021-11-05 MED ORDER — IOHEXOL 350 MG/ML SOLN
100.0000 mL | Freq: Once | INTRAVENOUS | Status: AC | PRN
  Administered 2021-11-05: 100 mL via INTRAVENOUS

## 2021-11-05 MED ORDER — ONDANSETRON HCL 4 MG/2ML IJ SOLN
4.0000 mg | Freq: Once | INTRAMUSCULAR | Status: AC
  Administered 2021-11-05: 4 mg via INTRAVENOUS
  Filled 2021-11-05: qty 2

## 2021-11-05 MED ORDER — HYDROMORPHONE HCL 1 MG/ML IJ SOLN
1.0000 mg | Freq: Once | INTRAMUSCULAR | Status: AC
Start: 2021-11-05 — End: 2021-11-05
  Administered 2021-11-05: 1 mg via INTRAVENOUS
  Filled 2021-11-05: qty 1

## 2021-11-05 NOTE — ED Provider Notes (Addendum)
[facility] DEPT Provider Note   CSN: [id number] Arrival date & time: 11/05/21  6629     History  Chief Complaint  Patient presents with   Back Pain    Monica [name] is a 26 y.o. female.  Patient with a complaint of lower back pain radiating up into her thoracic back area sometimes to the front of the chest.  No real pain to the anterior part of the abdomen.  Denies shortness of breath or cough.  Onset of the pain was acutely at about 2 in the morning.  Patient took a oxycodone that she had at home without any relief.  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  Undergoing chemo.  Last dose was a week ago.  Followed by Dr. Lindi Adie.  Patient seen January 15 for chest pain had a negative CTA of the chest.  Last seen by hematology oncology in the office on January 18 yesterday.  Not associated with any nausea or vomiting.  Patient does report urinary frequency.  Not associated with any numbness or weakness to the lower extremities.  No pain radiating into the lower extremities.  No upper extremity numbness or weakness.  No pain in the upper extremities.  No neck pain.  Patient's past medical history significant for the new diagnosis of right breast cancer in December 2022.  And recent Port-A-Cath placement on October 27, 2021.      Home Medications Prior to Admission medications   Medication Sig Start Date End Date Taking? Authorizing Provider  oxyCODONE (ROXICODONE) 5 MG immediate release tablet Take 1 tablet (5 mg total) by mouth every 4 (four) hours as needed for severe pain. 11/05/21  Yes Fredia Sorrow, MD  dexamethasone (DECADRON) 4 MG tablet Take 1 tablet (4 mg total) by mouth daily. Take 1 tablet day after chemo and 1 tablet 2 days after chemo with food Patient not taking: Reported on 10/28/2021 10/16/21   [name], MD  gabapentin (NEURONTIN) 100 MG capsule Take 100 mg by mouth daily. Patient not  taking: Reported on 10/28/2021 10/24/21   [provider]  lidocaine-prilocaine (EMLA) cream Apply to affected area once Patient not taking: Reported on 10/28/2021 10/16/21   [name], MD  LORazepam (ATIVAN) 0.5 MG tablet Take 1 tablet (0.5 mg total) by mouth at bedtime as needed for sleep. Patient not taking: Reported on 10/28/2021 10/16/21   [name], MD  naproxen (NAPROSYN) 500 MG tablet Take 500 mg by mouth 2 (two) times daily. Patient not taking: Reported on 10/28/2021 10/24/21   [provider]  ondansetron (ZOFRAN) 8 MG tablet Take 1 tablet (8 mg total) by mouth 2 (two) times daily as needed. Start on the third day after chemotherapy. Patient not taking: Reported on 10/28/2021 10/16/21   [name], MD  prochlorperazine (COMPAZINE) 10 MG tablet Take 1 tablet (10 mg total) by mouth every 6 (six) hours as needed (Nausea or vomiting). Patient not taking: Reported on 10/28/2021 10/16/21   [name], MD  VENTOLIN HFA 108 [phone] Base) MCG/ACT inhaler SMARTSIG:2 Puff(s) By Mouth Every 4 Hours PRN Patient not taking: Reported on 10/28/2021 08/28/21   [provider]      Allergies    Patient has no known allergies.    Review of Systems   Review of Systems  Constitutional:  Negative for chills and fever.  HENT:  Negative for ear pain and sore throat.   Eyes:  Negative for  pain and visual disturbance.  Respiratory:  Negative for cough and shortness of breath.   Cardiovascular:  Negative for chest pain and palpitations.  Gastrointestinal:  Negative for abdominal pain and vomiting.  Genitourinary:  Positive for frequency. Negative for dysuria and hematuria.  Musculoskeletal:  Positive for back pain. Negative for arthralgias.  Skin:  Negative for color change and rash.  Neurological:  Negative for seizures and syncope.  All other systems reviewed and are negative.  Physical Exam Updated Vital Signs BP 125/76 (BP Location: Left Arm)    Pulse (!) 101    Temp  98.4 F (36.9 C) (Oral)    Resp 18    LMP  (LMP Unknown) Comment: Few years ago   SpO2 100%  Physical Exam Vitals and nursing note reviewed.  Constitutional:      General: She is not in acute distress.    Appearance: Normal appearance. She is well-developed.  HENT:     Head: Normocephalic and atraumatic.  Eyes:     Extraocular Movements: Extraocular movements intact.     Conjunctiva/sclera: Conjunctivae normal.     Pupils: Pupils are equal, round, and reactive to light.  Cardiovascular:     Rate and Rhythm: Normal rate and regular rhythm.     Heart sounds: No murmur heard. Pulmonary:     Effort: Pulmonary effort is normal. No respiratory distress.     Breath sounds: Normal breath sounds.     Comments: Right breast mass.  Left-sided Port-A-Cath Abdominal:     Palpations: Abdomen is soft.     Tenderness: There is no abdominal tenderness.  Musculoskeletal:        General: No swelling or tenderness.     Cervical back: Normal range of motion and neck supple. No rigidity.  Skin:    General: Skin is warm and dry.     Capillary Refill: Capillary refill takes less than 2 seconds.  Neurological:     General: No focal deficit present.     Mental Status: She is alert and oriented to person, place, and time.     Cranial Nerves: No cranial nerve deficit.     Sensory: No sensory deficit.     Motor: No weakness.  Psychiatric:        Mood and Affect: Mood normal.    ED Results / Procedures / Treatments   Labs (all labs ordered are listed, but only abnormal results are displayed) Labs Reviewed  CBC WITH DIFFERENTIAL/PLATELET - Abnormal; Notable for the following components:      Result Value   WBC 3.5 (*)    RDW 10.7 (*)    Platelets 89 (*)    Neutro Abs 1.5 (*)    Abs Immature Granulocytes 0.32 (*)    All other components within normal limits  COMPREHENSIVE METABOLIC PANEL - Abnormal; Notable for the following components:   Glucose, Bld 106 (*)    All other components within  normal limits  LIPASE, BLOOD  URINALYSIS, ROUTINE W REFLEX MICROSCOPIC  I-STAT BETA HCG BLOOD, ED ([facility], [facility], [facility] ONLY)    EKG None  Radiology CT Angio Chest PE W/Cm &/Or Wo Cm  Result Date: 11/05/2021 CLINICAL DATA:  Back pain radiating into chest. Recent workup and diagnosis right breast carcinoma. EXAM: CT ANGIOGRAPHY CHEST WITH CONTRAST TECHNIQUE: Multidetector CT imaging of the chest was performed using the standard protocol during bolus administration of intravenous contrast. Multiplanar CT image reconstructions and MIPs were obtained to evaluate the vascular anatomy. RADIATION DOSE REDUCTION: This exam  was performed according to the departmental dose-optimization program which includes automated exposure control, adjustment of the mA and/or kV according to patient size and/or use of iterative reconstruction technique. CONTRAST:  159mL OMNIPAQUE IOHEXOL 350 MG/ML SOLN COMPARISON:  CTA of the chest on 11/01/2021 FINDINGS: Cardiovascular: Pulmonary arteries are adequately opacified. There is no evidence of pulmonary embolism. Central pulmonary arteries are normal in caliber. The thoracic aorta is also well opacified and demonstrates normal caliber without evidence of aneurysmal disease, dissection or atherosclerosis. Visualized proximal great vessels demonstrate normal patency. The heart size is normal. No pericardial fluid identified. No visible calcified coronary artery plaque. Stable appearance of left-sided Port-A-Cath. Mediastinum/Nodes: No enlarged mediastinal, hilar, or axillary lymph nodes. Thyroid gland, trachea, and esophagus demonstrate no significant findings. Lungs/Pleura: There is no evidence of pulmonary edema, consolidation, pneumothorax, nodule or pleural fluid. Upper Abdomen: No acute abnormality. Musculoskeletal: No chest wall abnormality. No acute or significant osseous findings. Biopsy clip again noted in the right breast. Review of the MIP images confirms the above findings.  IMPRESSION: Normal CTA of the chest.  No acute findings. Electronically Signed   By: Aletta Edouard M.D.   On: 11/05/2021 12:51   CT Abdomen Pelvis W Contrast  Result Date: 11/05/2021 CLINICAL DATA:  Abdominal pain, acute, nonlocalized. Left-sided back pain radiating to the chest. EXAM: CT ABDOMEN AND PELVIS WITH CONTRAST TECHNIQUE: Multidetector CT imaging of the abdomen and pelvis was performed using the standard protocol following bolus administration of intravenous contrast. RADIATION DOSE REDUCTION: This exam was performed according to the departmental dose-optimization program which includes automated exposure control, adjustment of the mA and/or kV according to patient size and/or use of iterative reconstruction technique. CONTRAST:  139mL OMNIPAQUE IOHEXOL 350 MG/ML SOLN COMPARISON:  None. FINDINGS: Lower chest: Lung bases are clear. Hepatobiliary: Liver and gallbladder appear normal. Pancreas: Normal Spleen: Normal Adrenals/Urinary Tract: Adrenal glands are normal. Kidneys are normal. No cyst, mass, stone or hydronephrosis. Contrast already present within the collecting system. Stomach/Bowel: Stomach and small intestine are normal. Normal appendix. Normal colon. Vascular/Lymphatic: Aorta and IVC are normal.  No adenopathy. Reproductive: Normal.  No pelvic mass. Other: Left inguinal hernia containing only fat. No edematous change. Musculoskeletal: Negative IMPRESSION: No abnormality seen to explain acute pain. Left inguinal hernia containing only fat. Electronically Signed   By: Nelson Chimes M.D.   On: 11/05/2021 12:50    Procedures Procedures    Medications Ordered in ED Medications  sodium chloride (PF) 0.9 % injection (has no administration in time range)  HYDROmorphone (DILAUDID) injection 1 mg (1 mg Intravenous Given 11/05/21 1050)  ondansetron (ZOFRAN) injection 4 mg (4 mg Intravenous Given 11/05/21 1050)  HYDROmorphone (DILAUDID) injection 1 mg (1 mg Intravenous Given 11/05/21 1219)   iohexol (OMNIPAQUE) 350 MG/ML injection 100 mL (100 mLs Intravenous Contrast Given 11/05/21 1225)    ED Course/ Medical Decision Making/ A&P                           Medical Decision Making Amount and/or Complexity of Data Reviewed Labs: ordered. Radiology: ordered.  Risk Prescription drug management.   Patient without any neurologic findings.  Pain actually seems to start in the low back area and radiate up into the thoracic chest area.  Which is atypical for nerve pain.  There is no weakness or numbness or any pain radiating into the lower extremities.  No GI symptoms no nausea vomiting or diarrhea.  Patient is a significant risk for  pulmonary embolus.  I am aware that she had a CT angio chest just on January 15 but with this acute onset of different and new type pain.  We will repeat the CT angio chest.  And will also CT abdomen pelvis this will also look at her bony part of her thoracic and lumbar spine.  CT without any acute findings.  No evidence of any pulmonary embolus.  Nothing to explain the patient's pain.  Pregnancy test negative.  Complete metabolic panel without any lab abnormalities at all.  Complete blood count white blood cell count 3.5.  Platelets at 89.  Hemoglobin good at 13.0.  Urinalysis negative no signs of urinary tract infection.  Overall work-up without anything to explain patient's pain.  Patient did improve here with hydromorphone IV.  Will try to contact her hematologist oncologist so that they are aware recommend follow-up with them.  And will give her another prescription for oxycodone 5 mg she can take 1 or 2 tablets every 4-6 hours.  Unable to send the oxycodone electronically so printed it.  Tried 3 times.  Not sure if there is a problem with connecting with the [facility] on [facility].   Final Clinical Impression(s) / ED Diagnoses Final diagnoses:  Acute midline thoracic back pain  Acute bilateral low back pain without sciatica  Malignant neoplasm  of upper-outer quadrant of right breast in female, estrogen receptor positive ([place])    Rx / DC Orders ED Discharge Orders          Ordered    oxyCODONE (ROXICODONE) 5 MG immediate release tablet  Every 4 hours PRN        11/05/21 1443              Fredia Sorrow, MD 11/05/21 1430    Fredia Sorrow, MD 11/05/21 1445    Fredia Sorrow, MD 11/05/21 1455

## 2021-11-05 NOTE — ED Notes (Signed)
Patient transported to CT 

## 2021-11-05 NOTE — Discharge Instructions (Signed)
Take the oxycodone 1 or 2 tablets every 4-6 hours as needed for pain.  Make an appointment to follow-up with a hematologist oncologist.  Today's work-up was extensive and cannot directly explain the pain.  Return for any new or worse symptoms.  No evidence of any blood clots in the lungs.  No evidence of any lesions on the bones of the spine and in the chest and low back area.  No evidence of any urinary tract infection.  No evidence of any blood clots in the lungs.  No evidence of evidence of any abnormal processes inside the abdomen.

## 2021-11-05 NOTE — ED Triage Notes (Signed)
Complains of sharp pains in her L back area that radiate up her back and to her chest, started early this morning, woke her from sleep, pt reports pain w/ sitting down. Denies SOB, cough. Denies dark urine or pain w/ urination, pt reports urinary frequency.

## 2021-11-06 ENCOUNTER — Encounter (HOSPITAL_COMMUNITY)
Admission: RE | Admit: 2021-11-06 | Discharge: 2021-11-06 | Disposition: A | Discharge status: Home / Self Care | Payer: Medicaid Other | Source: Ambulatory Visit | Attending: General Surgery | Admitting: General Surgery

## 2021-11-06 ENCOUNTER — Other Ambulatory Visit: Payer: Self-pay

## 2021-11-06 DIAGNOSIS — Z170 Estrogen receptor positive status [ER+]: Secondary | ICD-10-CM | POA: Insufficient documentation

## 2021-11-06 DIAGNOSIS — C50411 Malignant neoplasm of upper-outer quadrant of right female breast: Secondary | ICD-10-CM | POA: Insufficient documentation

## 2021-11-06 IMAGING — NM NM BONE WHOLE BODY
2 series · 2 of 2 positions shown · non-contrast
Comparison: None.

CLINICAL DATA: Right breast cancer, back pain

EXAM:
NUCLEAR MEDICINE WHOLE BODY BONE SCAN
TECHNIQUE: Whole body anterior and posterior images were obtained approximately
3 hours after intravenous injection of radiopharmaceutical.
RADIOPHARMACEUTICALS:  21.1 mCi [Y3] MDP IV

[Series 1: wbr_bone_40 whole body · 2.66mm/px · 1 of 1 slices shown (1 of 2)]
[im 1/1]
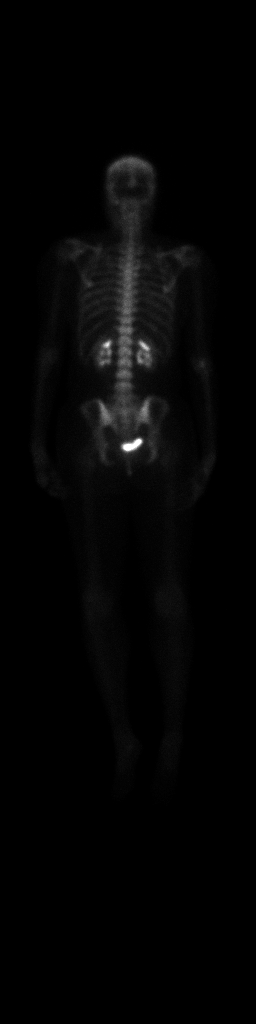

[Series 1: wbr_bone_40 whole body · 2.66mm/px · 1 of 1 slices shown (2 of 2)]
[im 1/1]
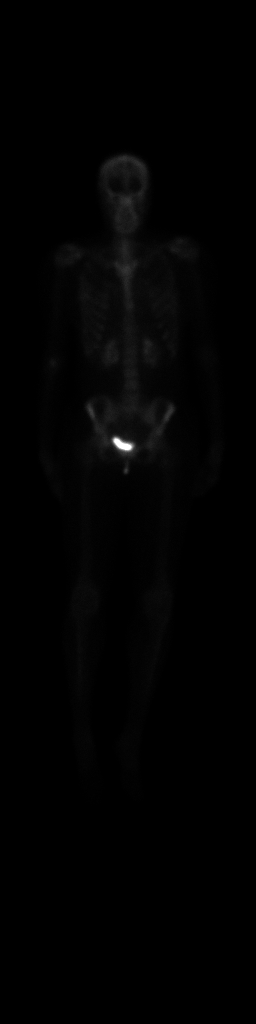

[2 of 2 positions shown; findings below may reference images not displayed]

FINDINGS: Normal distribution of radiotracer within the axial and appendicular
skeleton. Normal soft tissue distribution. Normal uptake and
excretion within the kidneys and bladder.
IMPRESSION: No evidence of osseous metastatic disease.

## 2021-11-06 MED ORDER — TECHNETIUM TC 99M MEDRONATE IV KIT
20.0000 | PACK | Freq: Once | INTRAVENOUS | Status: AC | PRN
  Administered 2021-11-06: 21.1 via INTRAVENOUS

## 2021-11-09 ENCOUNTER — Other Ambulatory Visit: Payer: Self-pay

## 2021-11-09 ENCOUNTER — Ambulatory Visit (INDEPENDENT_AMBULATORY_CARE_PROVIDER_SITE_OTHER): Payer: Medicaid Other | Admitting: Plastic Surgery

## 2021-11-09 ENCOUNTER — Institutional Professional Consult (permissible substitution): Payer: Medicaid Other | Admitting: Plastic Surgery

## 2021-11-09 ENCOUNTER — Encounter: Payer: Self-pay | Admitting: Plastic Surgery

## 2021-11-09 ENCOUNTER — Encounter: Payer: Self-pay | Admitting: *Deleted

## 2021-11-09 VITALS — BP 111/77 | HR 98 | Ht 66.0 in | Wt 138.8 lb

## 2021-11-09 DIAGNOSIS — C50911 Malignant neoplasm of unspecified site of right female breast: Secondary | ICD-10-CM

## 2021-11-10 ENCOUNTER — Other Ambulatory Visit: Payer: Self-pay

## 2021-11-10 ENCOUNTER — Encounter: Payer: Self-pay | Admitting: Rehabilitation

## 2021-11-10 ENCOUNTER — Ambulatory Visit: Payer: Medicaid Other | Admitting: Rehabilitation

## 2021-11-10 DIAGNOSIS — C50411 Malignant neoplasm of upper-outer quadrant of right female breast: Secondary | ICD-10-CM | POA: Diagnosis not present

## 2021-11-10 DIAGNOSIS — R42 Dizziness and giddiness: Secondary | ICD-10-CM

## 2021-11-10 NOTE — Therapy (Signed)
[facility] [address] [place], [place], [address] Phone: [phone]   Fax:  [phone]  Physical Therapy Evaluation  Patient Details  Name: Monica [name] MRN: [id number] Date of Birth: 1996-08-07 Referring Provider (PT): Dr. Lindi Adie   Encounter Date: 11/10/2021   PT End of Session - 11/10/21 1530     Visit Number 1    Number of Visits 1    PT Start Time 1503    PT Stop Time 1523    PT Time Calculation (min) 20 min    Activity Tolerance Patient tolerated treatment well    Behavior During Therapy [facility] for tasks assessed/performed             Past Medical History:  Diagnosis Date   Abscess    Cancer ([place]) 10/16/2021   right breast cancer    Past Surgical History:  Procedure Laterality Date   PORTACATH PLACEMENT Left 10/27/2021   Procedure: INSERTION PORT-A-CATH;  Surgeon: Stark Klein, MD;  Location: [facility];  Service: General;  Laterality: Left;    There were no vitals filed for this visit.    Subjective Assessment - 11/10/21 1509     Subjective I had my infusion and then a few days later I started feeling dizzy.  It was with all positions and constant. The dramanine helps    Pertinent History Pt learned she had right breast Cancer on Oct 13, 2021 after having biopsies performed. Pt had found the lump herself.  It was determined to be Right breast Invasive mammary carcinoma with positive LN, ER+ and Ki 67 of 60%    Patient Stated Goals I think I am ok    Currently in Pain? No/denies                [facility] PT Assessment - 11/10/21 0001       Assessment   Medical Diagnosis Right breast Cancer    Referring Provider (PT) Dr. Lindi Adie    Onset Date/Surgical Date 11/05/21                    Vestibular Assessment - 11/10/21 0001       Symptom Behavior   Subjective history of current problem see subjective    Type of Dizziness  Spinning    Frequency of Dizziness constant    Duration of  Dizziness 3-4 days    Symptom Nature Constant    Aggravating Factors No known aggravating factors    Relieving Factors Medication    Progression of Symptoms Better    History of similar episodes none      Oculomotor Exam   Oculomotor Alignment Normal    Spontaneous Absent    Smooth Pursuits Intact    Saccades Intact      Vestibulo-Ocular Reflex   VOR 1 Head Only (x 1 viewing) WNL    VOR 2 Head and Object (x 2 viewing) WNL      Positional Testing   Horizontal Canal Testing Horizontal Canal Right;Horizontal Canal Left      Horizontal Canal Right   Horizontal Canal Right Symptoms Normal      Horizontal Canal Left   Horizontal Canal Left Symptoms Normal      Positional Sensitivities   Positional Sensitivities Comments none                Objective measurements completed on examination: See above findings.  PT Education - 11/10/21 1529     Education Details reasons for vertigo/dizziness, BPPV vs chemotherapy related dizziness    Person(s) Educated Patient    Methods Explanation    Comprehension Verbalized understanding                 PT Long Term Goals - 10/22/21 1001       PT LONG TERM GOAL #1   Title Pt will restore shoulder ROM and function withinn 5-10 degrees of baseline measures    Time 20    Period Weeks    Status New    Target Date 03/04/22                    Plan - 11/10/21 1530     Clinical Impression Statement Pt had onset of dizziness post chemotherapy infusion #1 that lasted 3-4 days and has since resolved.  Pt was able to manage symptoms with dramamine.  Now that vertigo has resolved all BPPV testing and UVL testing are normal.  Pt was educated that her dizziness did not sound BPPV related and was most likely chemotherapy related and that dramamine/meclizine use could continue to help if approved.  Will still see this pt post operatively.    Consulted and Agree with Plan of Care Patient              Patient will benefit from skilled therapeutic intervention in order to improve the following deficits and impairments:     Visit Diagnosis: Dizziness and giddiness     Problem List Patient Active Problem List   Diagnosis Date Noted   Port-A-Cath in place 11/04/2021   Malignant neoplasm of upper-outer quadrant of right breast in female, estrogen receptor positive ([place]) 10/16/2021    Stark Bray, PT 11/10/2021, 3:32 PM  [facility] @ [address] [place], [place], [address] Phone: [phone]   Fax:  [phone]  Name: Monica [name] MRN: [id number] Date of Birth: 03/13/1996

## 2021-11-10 NOTE — Progress Notes (Signed)
Patient Care Team: Default, Provider, MD as PCP - General Monica Kaufmann, RN as Oncology Nurse Navigator Monica Germany, RN as Oncology Nurse Navigator  DIAGNOSIS:    ICD-10-CM   1. Malignant neoplasm of upper-outer quadrant of right breast in female, estrogen receptor positive ([place])  C50.411    Z17.0       SUMMARY OF ONCOLOGIC HISTORY: Oncology History  Malignant neoplasm of upper-outer quadrant of right breast in female, estrogen receptor positive ([place])  10/16/2021 Initial Diagnosis   Malignant neoplasm of upper-outer quadrant of right breast in female, estrogen receptor positive ([place])   10/16/2021 Cancer Staging   Staging form: Breast, AJCC 8th Edition - Clinical stage from 10/16/2021: Stage IIA (cT3, cN1, cM0, G2, ER+, PR+, HER2: Equivocal) - Signed by Monica Lose, MD on 10/16/2021 Histologic grading system: 3 grade system    10/28/2021 -  Chemotherapy   Patient is on Treatment Plan : BREAST ADJUVANT DOSE DENSE AC q14d / PACLitaxel q7d       CHIEF COMPLIANT: Cycle 2 dose dense Adriamycin and Cytoxan  INTERVAL HISTORY: Monica [name] is a 26 y.o. with above-mentioned history of ER/PR positive right breast cancer. She presents to the clinic today for follow-up.  Multiple emergency room visits with chest pain back pain pelvic pain she had even a couple of CT scans and bone scans.  There is no evidence of metastatic disease.  The pain is felt to be related to the Neulasta shot.  ALLERGIES:  has No Known Allergies.  MEDICATIONS:  Current Outpatient Medications  Medication Sig Dispense Refill   dexamethasone (DECADRON) 4 MG tablet Take 1 tablet (4 mg total) by mouth daily. Take 1 tablet day after chemo and 1 tablet 2 days after chemo with food 8 tablet 0   gabapentin (NEURONTIN) 100 MG capsule Take 100 mg by mouth daily.     lidocaine-prilocaine (EMLA) cream Apply to affected area once 30 g 3   LORazepam (ATIVAN) 0.5 MG tablet Take 1 tablet (0.5 mg total) by mouth at  bedtime as needed for sleep. 30 tablet 0   naproxen (NAPROSYN) 500 MG tablet Take 500 mg by mouth 2 (two) times daily.     ondansetron (ZOFRAN) 8 MG tablet Take 1 tablet (8 mg total) by mouth 2 (two) times daily as needed. Start on the third day after chemotherapy. 30 tablet 1   oxyCODONE (ROXICODONE) 5 MG immediate release tablet Take 1 tablet (5 mg total) by mouth every 4 (four) hours as needed for severe pain. 20 tablet 0   prochlorperazine (COMPAZINE) 10 MG tablet Take 1 tablet (10 mg total) by mouth every 6 (six) hours as needed (Nausea or vomiting). 30 tablet 1   VENTOLIN HFA 108 (90 Base) MCG/ACT inhaler      No current facility-administered medications for this visit.    PHYSICAL EXAMINATION: ECOG PERFORMANCE STATUS: 1 - Symptomatic but completely ambulatory  Vitals:   11/11/21 1150  BP: 123/78  Pulse: 83  Resp: 18  Temp: (!) 97.2 F (36.2 C)  SpO2: 100%   Filed Weights   11/11/21 1150  Weight: 138 lb 14.4 oz (63 kg)    LABORATORY DATA:  I have reviewed the data as listed CMP Latest Ref Rng & Units 11/11/2021 11/05/2021 11/04/2021  Glucose 70 - 99 mg/dL 97 106(H) 96  BUN 6 - 20 mg/dL _0 Creatinine 0.44 - 1.00 mg/dL 0.65 0.55 0.61  Sodium 135 - 145 mmol/L 140 136 136  Potassium 3.5 -  5.1 mmol/L 3.8 3.9 3.9  Chloride 98 - 111 mmol/L 107 103 103  CO2 22 - 32 mmol/L _0 Calcium 8.9 - 10.3 mg/dL 9.5 9.8 10.1  Total Protein 6.5 - 8.1 g/dL 6.9 7.7 7.4  Total Bilirubin 0.3 - 1.2 mg/dL 0.3 0.5 0.5  Alkaline Phos 38 - 126 U/L 51 41 40  AST 15 - 41 U/L 16 15 10(L)  ALT 0 - 44 U/L _1 Lab Results  Component Value Date   WBC 15.2 (H) 11/11/2021   HGB 11.8 (L) 11/11/2021   HCT 34.9 (L) 11/11/2021   MCV 92.6 11/11/2021   PLT 299 11/11/2021   NEUTROABS 8.6 (H) 11/11/2021    ASSESSMENT & PLAN:  Malignant neoplasm of upper-outer quadrant of right breast in female, estrogen receptor positive ([place]) 10/08/2021: Large palpable lump in the right breast with  deformity of the nipple, mammogram and ultrasound revealed 5.1 cm mass central right breast, additional lesion 7 mm and 8 mm, suspicious, morphologically normal intramammary lymph node at 10:00, single right axillary lymph node (both were positive for IDC), 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  Neoadjuvant chemotherapy with dose dense Adriamycin and Cytoxan followed by Taxol weekly x12   2. Mastectomy versus lumpectomy with targeted node dissection 3.  Adjuvant radiation 4.  Followed by adjuvant antiestrogen therapy (ovarian suppression) along with CDK 4 and 6 inhibitor 5.  Genetic testing -------------------------------------------------------------------------------------------------------------   Current Treatment:  Cycle 2 Adriamycin and Cytoxan started 10/28/2018 CT chest angio: No PE, no mets ED visit 11/05/21: Severe back pain :CT Angio: No mets Bone scan 11/06/21 Bone scan: No mets MRI guided biopsies 10/29/21: Left breast bx: Benign    Chemo Toxicities: Mild nausea BPV: I will refer her to physical therapy Leukopenia: We will reduce the second cycle of chemo Thrombocytopenia: Chemo induced 5.  Severe bone pain: We will discontinue Neulasta because of severe bone pain Therefore we will move her treatments to every 3 weeks.  RTC in 3 weeks for cycle 3    No orders of the defined types were placed in this encounter.  The patient has a good understanding of the overall plan. she agrees with it. she will call with any problems that may develop before the next visit here.  Total time spent: 30 mins including face to face time and time spent for planning, charting and coordination of care  Monica Eisenmenger, MD, MPH 11/11/2021  I, Monica [name], am acting as scribe for Dr. Nicholas [name].  I have reviewed the above documentation for accuracy and completeness, and I agree with the above.

## 2021-11-10 NOTE — Assessment & Plan Note (Signed)
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,FISH negative  Treatment plan: 1.Neoadjuvant chemotherapy with dose dense Adriamycin and Cytoxan followed by Taxol weekly x12  2.Mastectomyversus lumpectomy with targeted node dissection 3.Adjuvant radiation 4.Followed by adjuvant antiestrogen therapy (ovarian suppression) along with CDK 4 and 6 inhibitor 5.Genetic testing -------------------------------------------------------------------------------------------------------------  Current Treatment: Cycle 2 Adriamycin and Cytoxanstarted 10/28/2018 CT chest angio: No PE, no mets ED visit 11/05/21: Severe back pain :CT Angio: No mets Bone scan 11/06/21 Bone scan: No mets MRI guided biopsies 10/29/21: Left breast bx: Benign   Chemo Toxicities: 1. Mild nausea 2. BPV: I will refer her to physical therapy 3. Leukopenia: We will reduce the second cycle of chemo 4. Thrombocytopenia: Chemo induced   RTC in 3 weeks for cycle 3

## 2021-11-11 ENCOUNTER — Inpatient Hospital Stay: Payer: Medicaid Other

## 2021-11-11 ENCOUNTER — Inpatient Hospital Stay: Payer: Medicaid Other | Admitting: Hematology and Oncology

## 2021-11-11 ENCOUNTER — Other Ambulatory Visit: Payer: Self-pay

## 2021-11-11 DIAGNOSIS — Z170 Estrogen receptor positive status [ER+]: Secondary | ICD-10-CM

## 2021-11-11 DIAGNOSIS — Z5111 Encounter for antineoplastic chemotherapy: Secondary | ICD-10-CM | POA: Diagnosis not present

## 2021-11-11 DIAGNOSIS — Z95828 Presence of other vascular implants and grafts: Secondary | ICD-10-CM

## 2021-11-11 DIAGNOSIS — C50411 Malignant neoplasm of upper-outer quadrant of right female breast: Secondary | ICD-10-CM | POA: Diagnosis not present

## 2021-11-11 LAB — CBC WITH DIFFERENTIAL (CANCER CENTER ONLY)
Abs Immature Granulocytes: 2.9 10*3/uL — ABNORMAL HIGH (ref 0.00–0.07)
Basophils Absolute: 0.1 10*3/uL (ref 0.0–0.1)
Basophils Relative: 0 %
Eosinophils Absolute: 0 10*3/uL (ref 0.0–0.5)
Eosinophils Relative: 0 %
HCT: 34.9 % — ABNORMAL LOW (ref 36.0–46.0)
Hemoglobin: 11.8 g/dL — ABNORMAL LOW (ref 12.0–15.0)
Immature Granulocytes: 19 %
Lymphocytes Relative: 14 %
Lymphs Abs: 2.2 10*3/uL (ref 0.7–4.0)
MCH: 31.3 pg (ref 26.0–34.0)
MCHC: 33.8 g/dL (ref 30.0–36.0)
MCV: 92.6 fL (ref 80.0–100.0)
Monocytes Absolute: 1.4 10*3/uL — ABNORMAL HIGH (ref 0.1–1.0)
Monocytes Relative: 10 %
Neutro Abs: 8.6 10*3/uL — ABNORMAL HIGH (ref 1.7–7.7)
Neutrophils Relative %: 57 %
Platelet Count: 299 10*3/uL (ref 150–400)
RBC: 3.77 MIL/uL — ABNORMAL LOW (ref 3.87–5.11)
RDW: 11.6 % (ref 11.5–15.5)
Smear Review: NORMAL
WBC Count: 15.2 10*3/uL — ABNORMAL HIGH (ref 4.0–10.5)
nRBC: 0.6 % — ABNORMAL HIGH (ref 0.0–0.2)

## 2021-11-11 LAB — CMP (CANCER CENTER ONLY)
ALT: 8 U/L (ref 0–44)
AST: 16 U/L (ref 15–41)
Albumin: 4.5 g/dL (ref 3.5–5.0)
Alkaline Phosphatase: 51 U/L (ref 38–126)
Anion gap: 7 (ref 5–15)
BUN: 12 mg/dL (ref 6–20)
CO2: 26 mmol/L (ref 22–32)
Calcium: 9.5 mg/dL (ref 8.9–10.3)
Chloride: 107 mmol/L (ref 98–111)
Creatinine: 0.65 mg/dL (ref 0.44–1.00)
GFR, Estimated: >60 mL/min (ref >60)
Glucose, Bld: 97 mg/dL (ref 70–99)
Potassium: 3.8 mmol/L (ref 3.5–5.1)
Sodium: 140 mmol/L (ref 135–145)
Total Bilirubin: 0.3 mg/dL (ref 0.3–1.2)
Total Protein: 6.9 g/dL (ref 6.5–8.1)

## 2021-11-11 LAB — PREGNANCY, URINE: Preg Test, Ur: NEGATIVE

## 2021-11-11 MED ORDER — SODIUM CHLORIDE 0.9% FLUSH
10.0000 mL | Freq: Once | INTRAVENOUS | Status: AC
  Administered 2021-11-11: 11:00:00 10 mL

## 2021-11-11 MED ORDER — SODIUM CHLORIDE 0.9 % IV SOLN
10.0000 mg | Freq: Once | INTRAVENOUS | Status: AC
  Administered 2021-11-11: 13:00:00 10 mg via INTRAVENOUS
  Filled 2021-11-11: qty 10

## 2021-11-11 MED ORDER — DOXORUBICIN HCL CHEMO IV INJECTION 2 MG/ML
50.0000 mg/m2 | Freq: Once | INTRAVENOUS | Status: AC
  Administered 2021-11-11: 14:00:00 86 mg via INTRAVENOUS
  Filled 2021-11-11: qty 43

## 2021-11-11 MED ORDER — SODIUM CHLORIDE 0.9% FLUSH
10.0000 mL | INTRAVENOUS | Status: DC | PRN
  Administered 2021-11-11: 17:00:00 10 mL

## 2021-11-11 MED ORDER — SODIUM CHLORIDE 0.9 % IV SOLN
500.0000 mg/m2 | Freq: Once | INTRAVENOUS | Status: AC
  Administered 2021-11-11: 15:00:00 860 mg via INTRAVENOUS
  Filled 2021-11-11: qty 43

## 2021-11-11 MED ORDER — SODIUM CHLORIDE 0.9 % IV SOLN: Freq: Once | INTRAVENOUS | Status: AC

## 2021-11-11 MED ORDER — SODIUM CHLORIDE 0.9 % IV SOLN
150.0000 mg | Freq: Once | INTRAVENOUS | Status: AC
  Administered 2021-11-11: 13:00:00 150 mg via INTRAVENOUS
  Filled 2021-11-11: qty 150

## 2021-11-11 MED ORDER — PALONOSETRON HCL INJECTION 0.25 MG/5ML
0.2500 mg | Freq: Once | INTRAVENOUS | Status: AC
  Administered 2021-11-11: 13:00:00 0.25 mg via INTRAVENOUS
  Filled 2021-11-11: qty 5

## 2021-11-11 NOTE — Progress Notes (Signed)
Referring Provider Stark Klein, MD [place],  [place] [id number]   CC:  Right breast cancer.  Monica [name] is an 26 y.o. female.  HPI: Patient is a 26 year old female who is diagnosed with right breast cancer.  She has been referred by Dr. Barry [name].  The patient is going to undergo neoadjuvant chemotherapy.  She is currently undecided about whether she would like breast conserving surgery versus mastectomy.  This will likely depend on her response to chemotherapy as well as further discussions with Dr. Barry [name].  No Known Allergies  Outpatient Encounter Medications as of 11/09/2021  Medication Sig Note   oxyCODONE (ROXICODONE) 5 MG immediate release tablet Take 1 tablet (5 mg total) by mouth every 4 (four) hours as needed for severe pain.    dexamethasone (DECADRON) 4 MG tablet Take 1 tablet (4 mg total) by mouth daily. Take 1 tablet day after chemo and 1 tablet 2 days after chemo with food 10/23/2021: New medication patient to begin post operatively (with chemotherapy)   gabapentin (NEURONTIN) 100 MG capsule Take 100 mg by mouth daily.    lidocaine-prilocaine (EMLA) cream Apply to affected area once 10/23/2021: New medication patient to begin post operatively (with chemotherapy)    LORazepam (ATIVAN) 0.5 MG tablet Take 1 tablet (0.5 mg total) by mouth at bedtime as needed for sleep. 10/23/2021: New medication patient to begin post operatively (with chemotherapy)    naproxen (NAPROSYN) 500 MG tablet Take 500 mg by mouth 2 (two) times daily.    ondansetron (ZOFRAN) 8 MG tablet Take 1 tablet (8 mg total) by mouth 2 (two) times daily as needed. Start on the third day after chemotherapy. 10/23/2021: New medication patient to begin post operatively (with chemotherapy)    prochlorperazine (COMPAZINE) 10 MG tablet Take 1 tablet (10 mg total) by mouth every 6 (six) hours as needed (Nausea or vomiting). 10/23/2021: New medication patient to begin post operatively (with  chemotherapy)    VENTOLIN HFA 108 (90 Base) MCG/ACT inhaler     No facility-administered encounter medications on file as of 11/09/2021.     Past Medical History:  Diagnosis Date   Abscess    Cancer ([place]) 10/16/2021   right breast cancer    Past Surgical History:  Procedure Laterality Date   PORTACATH PLACEMENT Left 10/27/2021   Procedure: INSERTION PORT-A-CATH;  Surgeon: Stark Klein, MD;  Location: [facility];  Service: General;  Laterality: Left;    Family History  Problem Relation Age of Onset   Breast cancer Maternal Grandmother 50    Social History   Social History Narrative   Not on file     Review of Systems General: Denies fevers, chills, weight loss CV: Denies chest pain, shortness of breath, palpitations   Physical Exam Vitals with BMI 11/09/2021 11/05/2021 11/05/2021  Height 5\' 6"  - -  Weight 138 lbs 13 oz - -  BMI 40.98 - -  Systolic 119 147 829  Diastolic 77 85 76  Pulse 98 95 101    General:  No acute distress,  Alert and oriented, Non-Toxic, Normal speech and affect Breast: Bilateral moderate to large breast size with the right sternal notch to nipple 20 and left sternal notch to nipple 25 cm.  Base width is 15 cm bilaterally.  Nipple to fold is 10 cm on the right and 11 cm on the left.  Significant breast asymmetry.    Assessment/Plan Patient is a pleasant 26 year old female who is deciding between breast conservation  surgery versus mastectomy.  If she does elect to have mastectomy she expressed a lot of interest in bilateral mastectomy.  If she does elect to have mastectomy I think bilateral mastectomy may be a good option from a reconstructive standpoint for her since she is young and she is very concerned with breast symmetry.  Her current breast asymmetry that she states has evolved with her tumor growth is very upsetting for her.  She would likely have some asymmetry regardless of her choice but her best chance for excellent symmetry postoperatively if  she has mastectomy would be bilateral mastectomy.  I discussed with her that we would see her back after neoadjuvant treatment and further discussions with Dr. Barry [name].    We discussed the common reconstructive options and at this time she is most interested in tissue expander followed by implant reconstruction.  Time based coding: 33 minutes were spent with the patient.  Greater than 50% was spent on counseling cordination of care.  We discussed breast reconstruction options.  Lennice Sites 11/11/2021, 10:38 AM

## 2021-11-11 NOTE — Patient Instructions (Signed)
[facility]COL[facility]  Discharge Instructions: Thank you for choosing [facility] to provide your oncology and hematology care.   If you have a lab appointment with the Cancer Center, please go directly to the Cancer Center and check in at the registration area.   Wear comfortable clothing and clothing appropriate for easy access to any Portacath or PICC line.   We strive to give you quality time with your provider. You may need to reschedule your appointment if you arrive late (15 or more minutes).  Arriving late affects you and other patients whose appointments are after yours.  Also, if you miss three or more appointments without notifying the office, you may be dismissed from the clinic at the provider's discretion.      For prescription refill requests, have your pharmacy contact our office and allow 72 hours for refills to be completed.    Today you received the following chemotherapy and/or immunotherapy agents adriamycin, cytoxan      To help prevent nausea and vomiting after your treatment, we encourage you to take your nausea medication as directed.  BELOW ARE SYMPTOMS THAT SHOULD BE REPORTED IMMEDIATELY: *FEVER GREATER THAN 100.4 F (38 C) OR HIGHER *CHILLS OR SWEATING *NAUSEA AND VOMITING THAT IS NOT CONTROLLED WITH YOUR NAUSEA MEDICATION *UNUSUAL SHORTNESS OF BREATH *UNUSUAL BRUISING OR BLEEDING *URINARY PROBLEMS (pain or burning when urinating, or frequent urination) *BOWEL PROBLEMS (unusual diarrhea, constipation, pain near the anus) TENDERNESS IN MOUTH AND THROAT WITH OR WITHOUT PRESENCE OF ULCERS (sore throat, sores in mouth, or a toothache) UNUSUAL RASH, SWELLING OR PAIN  UNUSUAL VAGINAL DISCHARGE OR ITCHING   Items with * indicate a potential emergency and should be followed up as soon as possible or go to the Emergency Department if any problems should occur.  Please show the CHEMOTHERAPY ALERT CARD or IMMUNOTHERAPY ALERT CARD at  check-in to the Emergency Department and triage nurse.  Should you have questions after your visit or need to cancel or reschedule your appointment, please contact [facility]  Dept: [phone]  and follow the prompts.  Office hours are 8:00 a.m. to 4:30 p.m. Monday - Friday. Please note that voicemails left after 4:00 p.m. may not be returned until the following business day.  We are closed weekends and major holidays. You have access to a nurse at all times for urgent questions. Please call the main number to the clinic Dept: [phone] and follow the prompts.   For any non-urgent questions, you may also contact your provider using MyChart. We now offer e-Visits for anyone 18 and older to request care online for non-urgent symptoms. For details visit [email or web address].   Also download the MyChart app! Go to the app store, search "MyChart", open the app, select [facility], and log in with your MyChart username and password.  Due to Covid, a mask is required upon entering the hospital/clinic. If you do not have a mask, one will be given to you upon arrival. For doctor visits, patients may have 1 support person aged 18 or older with them. For treatment visits, patients cannot have anyone with them due to current Covid guidelines and our immunocompromised population.   

## 2021-11-12 ENCOUNTER — Telehealth: Payer: Self-pay | Admitting: Hematology and Oncology

## 2021-11-12 NOTE — Telephone Encounter (Signed)
Scheduled appointment per 1/25 los. Patient is aware. Patient will be mailed an updated calendar. °

## 2021-11-13 ENCOUNTER — Inpatient Hospital Stay: Payer: Medicaid Other

## 2021-11-25 ENCOUNTER — Inpatient Hospital Stay: Payer: Medicaid Other | Admitting: Genetic Counselor

## 2021-11-25 ENCOUNTER — Inpatient Hospital Stay: Payer: Medicaid Other

## 2021-11-25 ENCOUNTER — Inpatient Hospital Stay: Payer: Medicaid Other | Admitting: Hematology and Oncology

## 2021-11-27 ENCOUNTER — Inpatient Hospital Stay: Payer: Medicaid Other

## 2021-12-01 MED FILL — Dexamethasone Sodium Phosphate Inj 100 MG/10ML: INTRAMUSCULAR | Qty: 1 | Status: AC

## 2021-12-01 MED FILL — Fosaprepitant Dimeglumine For IV Infusion 150 MG (Base Eq): INTRAVENOUS | Qty: 5 | Status: AC

## 2021-12-02 ENCOUNTER — Encounter: Payer: Self-pay | Admitting: Adult Health

## 2021-12-02 ENCOUNTER — Inpatient Hospital Stay: Payer: Medicaid Other | Admitting: Adult Health

## 2021-12-02 ENCOUNTER — Other Ambulatory Visit: Payer: Self-pay

## 2021-12-02 ENCOUNTER — Inpatient Hospital Stay: Payer: Medicaid Other | Attending: Hematology and Oncology

## 2021-12-02 ENCOUNTER — Inpatient Hospital Stay: Payer: Medicaid Other

## 2021-12-02 VITALS — BP 116/76 | HR 92 | Temp 97.7°F | Resp 16 | Ht 66.0 in | Wt 137.4 lb

## 2021-12-02 DIAGNOSIS — Z5111 Encounter for antineoplastic chemotherapy: Secondary | ICD-10-CM | POA: Insufficient documentation

## 2021-12-02 DIAGNOSIS — Z79899 Other long term (current) drug therapy: Secondary | ICD-10-CM | POA: Diagnosis not present

## 2021-12-02 DIAGNOSIS — Z95828 Presence of other vascular implants and grafts: Secondary | ICD-10-CM

## 2021-12-02 DIAGNOSIS — C50411 Malignant neoplasm of upper-outer quadrant of right female breast: Secondary | ICD-10-CM

## 2021-12-02 DIAGNOSIS — Z170 Estrogen receptor positive status [ER+]: Secondary | ICD-10-CM

## 2021-12-02 LAB — CMP (CANCER CENTER ONLY)
ALT: 13 U/L (ref 0–44)
AST: 20 U/L (ref 15–41)
Albumin: 4.6 g/dL (ref 3.5–5.0)
Alkaline Phosphatase: 38 U/L (ref 38–126)
Anion gap: 7 (ref 5–15)
BUN: 13 mg/dL (ref 6–20)
CO2: 24 mmol/L (ref 22–32)
Calcium: 9.2 mg/dL (ref 8.9–10.3)
Chloride: 105 mmol/L (ref 98–111)
Creatinine: 0.47 mg/dL (ref 0.44–1.00)
GFR, Estimated: >60 mL/min (ref >60)
Glucose, Bld: 94 mg/dL (ref 70–99)
Potassium: 3.7 mmol/L (ref 3.5–5.1)
Sodium: 136 mmol/L (ref 135–145)
Total Bilirubin: 0.2 mg/dL — ABNORMAL LOW (ref 0.3–1.2)
Total Protein: 7.1 g/dL (ref 6.5–8.1)

## 2021-12-02 LAB — CBC WITH DIFFERENTIAL (CANCER CENTER ONLY)
Abs Immature Granulocytes: 0.02 10*3/uL (ref 0.00–0.07)
Basophils Absolute: 0.1 10*3/uL (ref 0.0–0.1)
Basophils Relative: 1 %
Eosinophils Absolute: 0.1 10*3/uL (ref 0.0–0.5)
Eosinophils Relative: 2 %
HCT: 33.4 % — ABNORMAL LOW (ref 36.0–46.0)
Hemoglobin: 12.1 g/dL (ref 12.0–15.0)
Immature Granulocytes: 1 %
Lymphocytes Relative: 38 %
Lymphs Abs: 1.6 10*3/uL (ref 0.7–4.0)
MCH: 32.4 pg (ref 26.0–34.0)
MCHC: 36.2 g/dL — ABNORMAL HIGH (ref 30.0–36.0)
MCV: 89.5 fL (ref 80.0–100.0)
Monocytes Absolute: 0.6 10*3/uL (ref 0.1–1.0)
Monocytes Relative: 13 %
Neutro Abs: 1.9 10*3/uL (ref 1.7–7.7)
Neutrophils Relative %: 45 %
Platelet Count: 298 10*3/uL (ref 150–400)
RBC: 3.73 MIL/uL — ABNORMAL LOW (ref 3.87–5.11)
RDW: 13.2 % (ref 11.5–15.5)
WBC Count: 4.2 10*3/uL (ref 4.0–10.5)
nRBC: 0 % (ref 0.0–0.2)

## 2021-12-02 LAB — PREGNANCY, URINE: Preg Test, Ur: NEGATIVE

## 2021-12-02 MED ORDER — PALONOSETRON HCL INJECTION 0.25 MG/5ML
0.2500 mg | Freq: Once | INTRAVENOUS | Status: AC
  Administered 2021-12-02: 0.25 mg via INTRAVENOUS
  Filled 2021-12-02: qty 5

## 2021-12-02 MED ORDER — SODIUM CHLORIDE 0.9 % IV SOLN
500.0000 mg/m2 | Freq: Once | INTRAVENOUS | Status: AC
  Administered 2021-12-02: 860 mg via INTRAVENOUS
  Filled 2021-12-02: qty 43

## 2021-12-02 MED ORDER — SODIUM CHLORIDE 0.9% FLUSH
10.0000 mL | INTRAVENOUS | Status: DC | PRN
  Administered 2021-12-02: 10 mL

## 2021-12-02 MED ORDER — SODIUM CHLORIDE 0.9 % IV SOLN
10.0000 mg | Freq: Once | INTRAVENOUS | Status: AC
  Administered 2021-12-02: 10 mg via INTRAVENOUS
  Filled 2021-12-02: qty 10

## 2021-12-02 MED ORDER — SODIUM CHLORIDE 0.9 % IV SOLN
150.0000 mg | Freq: Once | INTRAVENOUS | Status: AC
  Administered 2021-12-02: 150 mg via INTRAVENOUS
  Filled 2021-12-02: qty 150

## 2021-12-02 MED ORDER — SODIUM CHLORIDE 0.9% FLUSH
10.0000 mL | Freq: Once | INTRAVENOUS | Status: AC
  Administered 2021-12-02: 10 mL

## 2021-12-02 MED ORDER — HEPARIN SOD (PORK) LOCK FLUSH 100 UNIT/ML IV SOLN
500.0000 [IU] | Freq: Once | INTRAVENOUS | Status: AC | PRN
  Administered 2021-12-02: 500 [IU]

## 2021-12-02 MED ORDER — SODIUM CHLORIDE 0.9 % IV SOLN: Freq: Once | INTRAVENOUS | Status: AC

## 2021-12-02 MED ORDER — DOXORUBICIN HCL CHEMO IV INJECTION 2 MG/ML
50.0000 mg/m2 | Freq: Once | INTRAVENOUS | Status: AC
  Administered 2021-12-02: 86 mg via INTRAVENOUS
  Filled 2021-12-02: qty 43

## 2021-12-02 NOTE — Progress Notes (Signed)
[facility] Follow up:    Default, Provider, MD No address on file   DIAGNOSIS:  Cancer Staging  Malignant neoplasm of upper-outer quadrant of right breast in female, estrogen receptor positive ([place]) Staging form: Breast, AJCC 8th Edition - Clinical stage from 10/16/2021: Stage IIA (cT3, cN1, cM0, G2, ER+, PR+, HER2: Equivocal) - Signed by [name], MD on 10/16/2021 Histologic grading system: 3 grade system   SUMMARY OF ONCOLOGIC HISTORY: Oncology History  Malignant neoplasm of upper-outer quadrant of right breast in female, estrogen receptor positive ([place])  10/16/2021 Initial Diagnosis   Malignant neoplasm of upper-outer quadrant of right breast in female, estrogen receptor positive ([place])   10/16/2021 Cancer Staging   Staging form: Breast, AJCC 8th Edition - Clinical stage from 10/16/2021: Stage IIA (cT3, cN1, cM0, G2, ER+, PR+, HER2: Equivocal) - Signed by [name], MD on 10/16/2021 Histologic grading system: 3 grade system    10/28/2021 -  Chemotherapy   Patient is on Treatment Plan : BREAST ADJUVANT DOSE DENSE AC q14d / PACLitaxel q7d       CURRENT THERAPY: Adriamycin and Cytoxan  INTERVAL HISTORY: Monica [name] 26 y.o. female returns for evaluation prior to receiving her third cycle of dose dense Adriamycin and Cytoxan.  She notes that she has been feeling better since she has gotten an extra week off of chemotherapy due to not receiving the Neulasta shot.  She has not had such significant pain.  She did note some nausea for a little bit more time this past cycle and said the Compazine was not particularly helpful.  She had not tried the Zofran.  Otherwise she says she is feeling well.  She notes she can still feel her breast tumor and does not note that it feels any smaller softer.  She has a genetic counseling appointment set up on December 23, 2021 with Roma Kayser.   Patient Active Problem List   Diagnosis Date Noted   Port-A-Cath in place  11/04/2021   Malignant neoplasm of upper-outer quadrant of right breast in female, estrogen receptor positive ([place]) 10/16/2021    has No Known Allergies.  MEDICAL HISTORY: Past Medical History:  Diagnosis Date   Abscess    Cancer ([place]) 10/16/2021   right breast cancer    SURGICAL HISTORY: Past Surgical History:  Procedure Laterality Date   PORTACATH PLACEMENT Left 10/27/2021   Procedure: INSERTION PORT-A-CATH;  Surgeon: Stark Klein, MD;  Location: [facility];  Service: General;  Laterality: Left;    SOCIAL HISTORY: Social History   Socioeconomic History   Marital status: Married    Spouse name: Not on file   Number of children: Not on file   Years of education: Not on file   Highest education level: Not on file  Occupational History   Not on file  Tobacco Use   Smoking status: Never   Smokeless tobacco: Never  Vaping Use   Vaping Use: Never used  Substance and Sexual Activity   Alcohol use: No   Drug use: Never   Sexual activity: Yes    Birth control/protection: Other-see comments    Comment: Depo Shot  Other Topics Concern   Not on file  Social History Narrative   Not on file   Social Determinants of Health   Financial Resource Strain: Not on file  Food Insecurity: Not on file  Transportation Needs: Not on file  Physical Activity: Not on file  Stress: Not on file  Social Connections: Not on file  Intimate Partner Violence: Not on file    FAMILY HISTORY: Family History  Problem Relation Age of Onset   Breast cancer Maternal Grandmother 40    Review of Systems  Constitutional:  Negative for appetite change, chills, fatigue, fever and unexpected weight change.  HENT:   Negative for hearing loss, lump/mass and trouble swallowing.   Eyes:  Negative for eye problems and icterus.  Respiratory:  Negative for chest tightness, cough and shortness of breath.   Cardiovascular:  Negative for chest pain, leg swelling and palpitations.  Gastrointestinal:  Negative  for abdominal distention, abdominal pain, constipation, diarrhea, nausea and vomiting.  Endocrine: Negative for hot flashes.  Genitourinary:  Negative for difficulty urinating.   Musculoskeletal:  Negative for arthralgias.  Skin:  Negative for itching and rash.  Neurological:  Negative for dizziness, extremity weakness, headaches and numbness.  Hematological:  Negative for adenopathy. Does not bruise/bleed easily.  Psychiatric/Behavioral:  Negative for depression. The patient is not nervous/anxious.      PHYSICAL EXAMINATION  ECOG PERFORMANCE STATUS: 1 - Symptomatic but completely ambulatory  Vitals:   12/02/21 0933  BP: 116/76  Pulse: 92  Resp: 16  Temp: 97.7 F (36.5 C)  SpO2: 100%    Physical Exam Constitutional:      General: She is not in acute distress.    Appearance: Normal appearance. She is not toxic-appearing.  HENT:     Head: Normocephalic and atraumatic.  Eyes:     General: No scleral icterus. Cardiovascular:     Rate and Rhythm: Normal rate and regular rhythm.     Pulses: Normal pulses.     Heart sounds: Normal heart sounds.  Pulmonary:     Effort: Pulmonary effort is normal.     Breath sounds: Normal breath sounds.  Abdominal:     General: Abdomen is flat. Bowel sounds are normal. There is no distension.     Palpations: Abdomen is soft.     Tenderness: There is no abdominal tenderness.  Musculoskeletal:        General: No swelling.     Cervical back: Neck supple.  Lymphadenopathy:     Cervical: No cervical adenopathy.  Skin:    General: Skin is warm and dry.     Findings: No rash.  Neurological:     General: No focal deficit present.     Mental Status: She is alert.  Psychiatric:        Mood and Affect: Mood normal.        Behavior: Behavior normal.    LABORATORY DATA:  CBC    Component Value Date/Time   WBC 4.2 12/02/2021 0916   WBC 3.5 (L) 11/05/2021 1053   RBC 3.73 (L) 12/02/2021 0916   HGB 12.1 12/02/2021 0916   HCT 33.4 (L)  12/02/2021 0916   PLT 298 12/02/2021 0916   MCV 89.5 12/02/2021 0916   MCH 32.4 12/02/2021 0916   MCHC 36.2 (H) 12/02/2021 0916   RDW 13.2 12/02/2021 0916   LYMPHSABS 1.6 12/02/2021 0916   MONOABS 0.6 12/02/2021 0916   EOSABS 0.1 12/02/2021 0916   BASOSABS 0.1 12/02/2021 0916    CMP     Component Value Date/Time   NA 140 11/11/2021 1126   K 3.8 11/11/2021 1126   CL 107 11/11/2021 1126   CO2 26 11/11/2021 1126   GLUCOSE 97 11/11/2021 1126   BUN 12 11/11/2021 1126   CREATININE 0.65 11/11/2021 1126   CALCIUM 9.5 11/11/2021 1126   PROT 6.9 11/11/2021 1126  ALBUMIN 4.5 11/11/2021 1126   AST 16 11/11/2021 1126   ALT 8 11/11/2021 1126   ALKPHOS 51 11/11/2021 1126   BILITOT 0.3 11/11/2021 1126   GFRNONAA >60 11/11/2021 1126         ASSESSMENT and THERAPY PLAN:   Malignant neoplasm of upper-outer quadrant of right breast in female, estrogen receptor positive ([place]) 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  Neoadjuvant chemotherapy with dose dense Adriamycin and Cytoxan followed by Taxol weekly x12   2. Mastectomy versus lumpectomy with targeted node dissection 3.  Adjuvant radiation 4.  Followed by adjuvant antiestrogen therapy (ovarian suppression) along with CDK 4 and 6 inhibitor 5.  Genetic testing -------------------------------------------------------------------------------------------------------------   Current Treatment:  Cycle 3 Adriamycin and Cytoxan started 10/28/2018 CT chest angio: No PE, no mets ED visit 11/05/21: Severe back pain :CT Angio: No mets Bone scan 11/06/21 Bone scan: No  mets MRI guided biopsies 10/29/21: Left breast bx: Benign    Chemo Toxicities: Mild nausea-we reviewed how to take her antinausea meds today BPV: Resolved today status post physical therapy Leukopenia: Labs not available at the completion of today's visit Thrombocytopenia: Labs not available at the completion of today's visit  So long as her labs are within parameters she can proceed with her third cycle of treatment with Adriamycin and Cytoxan today.  Unfortunately there are no labs available and resulted at the completion of today's visit.  I let her know that as soon as we have those results if anything is off we will let her know prior to her receiving treatment.  RTC in 3 weeks for cycle 4   All questions were answered. The patient knows to call the clinic with any problems, questions or concerns. We can certainly see the patient much sooner if necessary.  Total encounter time: 20 minutes in face-to-face visit time, chart review, lab review, care coordination, order entry, discussion with nursing staff, and documentation of encounter.  Wilber Bihari, NP 12/02/21 9:53 AM Medical Oncology and Hematology [facility] [address], [place] [address] Tel. [phone]    Fax. [phone]  *Total Encounter Time as defined by the Centers for Medicare and Medicaid Services includes, in addition to the face-to-face time of a patient visit (documented in the note above) non-face-to-face time: obtaining and reviewing outside history, ordering and reviewing medications, tests or procedures, care coordination (communications with other health care professionals or caregivers) and documentation in the medical record.

## 2021-12-02 NOTE — Patient Instructions (Signed)
[facility] ONCOLOG[facility]   Discharge Instructions: Thank you for choosing [facility] to provide your oncology and hematology care.   If you have a lab appointment with the [facility], please go directly to the [facility] and check in at the registration area.   Wear comfortable clothing and clothing appropriate for easy access to any Portacath or PICC line.   We strive to give you quality time with your provider. You may need to reschedule your appointment if you arrive late (15 or more minutes).  Arriving late affects you and other patients whose appointments are after yours.  Also, if you miss three or more appointments without notifying the office, you may be dismissed from the clinic at the providers discretion.      For prescription refill requests, have your pharmacy contact our office and allow 72 hours for refills to be completed.    Today you received the following chemotherapy and/or immunotherapy agents: Doxorubicin (Adriamycin) and Cyclophosphamide (Cytoxan)      To help prevent nausea and vomiting after your treatment, we encourage you to take your nausea medication as directed.  BELOW ARE SYMPTOMS THAT SHOULD BE REPORTED IMMEDIATELY: *FEVER GREATER THAN 100.4 F (38 C) OR HIGHER *CHILLS OR SWEATING *NAUSEA AND VOMITING THAT IS NOT CONTROLLED WITH YOUR NAUSEA MEDICATION *UNUSUAL SHORTNESS OF BREATH *UNUSUAL BRUISING OR BLEEDING *URINARY PROBLEMS (pain or burning when urinating, or frequent urination) *BOWEL PROBLEMS (unusual diarrhea, constipation, pain near the anus) TENDERNESS IN MOUTH AND THROAT WITH OR WITHOUT PRESENCE OF ULCERS (sore throat, sores in mouth, or a toothache) UNUSUAL RASH, SWELLING OR PAIN  UNUSUAL VAGINAL DISCHARGE OR ITCHING   Items with * indicate a potential emergency and should be followed up as soon as possible or go to the Emergency Department if any problems should occur.  Please show the CHEMOTHERAPY ALERT  CARD or IMMUNOTHERAPY ALERT CARD at check-in to the Emergency Department and triage nurse.  Should you have questions after your visit or need to cancel or reschedule your appointment, please contact [facility]  Dept: [phone]  and follow the prompts.  Office hours are 8:00 a.m. to 4:30 p.m. Monday - Friday. Please note that voicemails left after 4:00 p.m. may not be returned until the following business day.  We are closed weekends and major holidays. You have access to a nurse at all times for urgent questions. Please call the main number to the clinic Dept: [phone] and follow the prompts.   For any non-urgent questions, you may also contact your provider using MyChart. We now offer e-Visits for anyone 23 and older to request care online for non-urgent symptoms. For details visit [email or web address].   Also download the MyChart app! Go to the app store, search "MyChart", open the app, select [facility], and log in with your MyChart username and password.  Due to Covid, a mask is required upon entering the hospital/clinic. If you do not have a mask, one will be given to you upon arrival. For doctor visits, patients may have 1 support person aged 33 or older with them. For treatment visits, patients cannot have anyone with them due to current Covid guidelines and our immunocompromised population.

## 2021-12-02 NOTE — Assessment & Plan Note (Signed)
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,FISH negative  Treatment plan: 1.Neoadjuvant chemotherapy with dose dense Adriamycin and Cytoxan followed by Taxol weekly x12  2.Mastectomyversus lumpectomy with targeted node dissection 3.Adjuvant radiation 4.Followed by adjuvant antiestrogen therapy (ovarian suppression) along with CDK 4 and 6 inhibitor 5.Genetic testing -------------------------------------------------------------------------------------------------------------  Current Treatment: Cycle 3 Adriamycin and Cytoxanstarted 10/28/2018 CT chest angio: No PE, no mets ED visit 11/05/21: Severe back pain :CT Angio: No mets Bone scan 11/06/21 Bone scan: No mets MRI guided biopsies 10/29/21: Left breast bx: Benign   Chemo Toxicities: 1. Mild nausea-we reviewed how to take her antinausea meds today 2. BPV: Resolved today status post physical therapy 3. Leukopenia: Labs not available at the completion of today's visit 4. Thrombocytopenia: Labs not available at the completion of today's visit  So long as her labs are within parameters she can proceed with her third cycle of treatment with Adriamycin and Cytoxan today.  Unfortunately there are no labs available and resulted at the completion of today's visit.  I let her know that as soon as we have those results if anything is off we will let her know prior to her receiving treatment.  RTC in 3 weeks for cycle 4

## 2021-12-09 ENCOUNTER — Ambulatory Visit: Payer: Medicaid Other

## 2021-12-09 ENCOUNTER — Encounter: Payer: Self-pay | Admitting: *Deleted

## 2021-12-09 ENCOUNTER — Other Ambulatory Visit: Payer: Medicaid Other

## 2021-12-09 ENCOUNTER — Ambulatory Visit: Payer: Medicaid Other | Admitting: Hematology and Oncology

## 2021-12-11 ENCOUNTER — Telehealth: Payer: Self-pay | Admitting: *Deleted

## 2021-12-11 ENCOUNTER — Other Ambulatory Visit: Payer: Self-pay | Admitting: *Deleted

## 2021-12-11 NOTE — Telephone Encounter (Signed)
Received call from pt with complaint of intermittent mild discomfort in the lower abdomen/pelvis area.  Pt requesting refill for Oxycodone.  RN reviewed with MD and verbal orders received by MD to discontinue Oxycodone and educate pt on OTC tylenol as well as applying a heating pad as needed.  Pt educated and verbalized understanding.

## 2021-12-14 ENCOUNTER — Encounter: Payer: Self-pay | Admitting: Adult Health

## 2021-12-14 ENCOUNTER — Encounter: Payer: Self-pay | Admitting: Hematology and Oncology

## 2021-12-14 NOTE — Progress Notes (Signed)
Met with patient at registration to complete grant process.  Patient approved for one-time $1000 [employer] grant to assist with personal expenses while going through treatment.  Discussed expenses in detail and how they are covered. She received a gift card today. She has a copy of the approval letter and expense sheet along with the Outpatient pharmacy information.  She has a copy of the paperwork and my card in a green folder for any additional financial questions or concerns.

## 2021-12-14 NOTE — Progress Notes (Signed)
Received email from patient regarding appointment for Monica [name].  Patient to come in today 12/14/21 at 2pm to meet with me to complete process. Advised what is needed to apply.  She has my contact name and number for any additional questions or concerns.

## 2021-12-22 ENCOUNTER — Other Ambulatory Visit: Payer: Self-pay | Admitting: Genetic Counselor

## 2021-12-22 DIAGNOSIS — C50411 Malignant neoplasm of upper-outer quadrant of right female breast: Secondary | ICD-10-CM

## 2021-12-22 MED FILL — Fosaprepitant Dimeglumine For IV Infusion 150 MG (Base Eq): INTRAVENOUS | Qty: 5 | Status: AC

## 2021-12-22 MED FILL — Dexamethasone Sodium Phosphate Inj 100 MG/10ML: INTRAMUSCULAR | Qty: 1 | Status: AC

## 2021-12-22 NOTE — Progress Notes (Signed)
? ?Patient Care Team: ?Default, Provider, MD as PCP - General ?[name], RN as Oncology Nurse Navigator ?[name], RN as Oncology Nurse Navigator ? ?DIAGNOSIS:  ?  ICD-10-CM   ?1. Malignant neoplasm of upper-outer quadrant of right breast in female, estrogen receptor positive ([facility])  C50.411   ? Z17.0   ?  ? ? ?SUMMARY OF ONCOLOGIC HISTORY: ?Oncology History  ?Malignant neoplasm of upper-outer quadrant of right breast in female, estrogen receptor positive ([place])  ?10/16/2021 Initial Diagnosis  ? Malignant neoplasm of upper-outer quadrant of right breast in female, estrogen receptor positive ([place]) ?  ?10/16/2021 Cancer Staging  ? Staging form: Breast, AJCC 8th Edition ?- Clinical stage from 10/16/2021: Stage IIA (cT3, cN1, cM0, G2, ER+, PR+, HER2: Equivocal) - Signed by [name], MD on 10/16/2021 ?Histologic grading system: 3 grade system ? ?  ?10/28/2021 -  Chemotherapy  ? Patient is on Treatment Plan : BREAST ADJUVANT DOSE DENSE AC q14d / PACLitaxel q7d  ?   ? ? ?CHIEF COMPLIANT: Cycle 4 dose dense Adriamycin and Cytoxan ? ?INTERVAL HISTORY: Monica [name] is a 26 y.o. with above-mentioned history of ER/PR positive right breast cancer, currently on chemotherapy with dose dense AC. She presents to the clinic today for treatment.  She experiences nausea for about 7 days after each chemotherapy and she tells me that the nausea medicine did not appear to be helping her much.  She sleeps most of the days during the week after chemo but then she recovers and she does better.  She has noticed skin discoloration of the hands as well as darkening of the nailbeds. ? ?ALLERGIES:  has No Known Allergies. ? ?MEDICATIONS:  ?Current Outpatient Medications  ?Medication Sig Dispense Refill  ? dexamethasone (DECADRON) 4 MG tablet Take 1 tablet (4 mg total) by mouth daily. Take 1 tablet day after chemo and 1 tablet 2 days after chemo with food 8 tablet 0  ? gabapentin (NEURONTIN) 100 MG capsule Take 100 mg by mouth  daily.    ? lidocaine-prilocaine (EMLA) cream Apply to affected area once 30 g 3  ? LORazepam (ATIVAN) 0.5 MG tablet Take 1 tablet (0.5 mg total) by mouth at bedtime as needed for sleep. 30 tablet 0  ? naproxen (NAPROSYN) 500 MG tablet Take 500 mg by mouth 2 (two) times daily.    ? ondansetron (ZOFRAN) 8 MG tablet Take 1 tablet (8 mg total) by mouth 2 (two) times daily as needed. Start on the third day after chemotherapy. 30 tablet 1  ? prochlorperazine (COMPAZINE) 10 MG tablet Take 1 tablet (10 mg total) by mouth every 6 (six) hours as needed (Nausea or vomiting). 30 tablet 1  ? VENTOLIN HFA 108 (90 Base) MCG/ACT inhaler     ? ?No current facility-administered medications for this visit.  ? ? ?PHYSICAL EXAMINATION: ?ECOG PERFORMANCE STATUS: 1 - Symptomatic but completely ambulatory ? ?Vitals:  ? 12/23/21 0938  ?BP: 125/78  ?Pulse: 76  ?Resp: 16  ?Temp: 97.9 ?F (36.6 ?C)  ?SpO2: 100%  ? ?Filed Weights  ? 12/23/21 0938  ?Weight: 141 lb 4.8 oz (64.1 kg)  ? ? ?LABORATORY DATA:  ?I have reviewed the data as listed ?CMP Latest Ref Rng & Units 12/02/2021 11/11/2021 11/05/2021  ?Glucose 70 - 99 mg/dL 94 97 106(H)  ?BUN 6 - 20 mg/dL _0 ?Creatinine 0.44 - 1.00 mg/dL 0.47 0.65 0.55  ?Sodium 135 - 145 mmol/L 136 140 136  ?Potassium 3.5 - 5.1 mmol/L  3.7 3.8 3.9  ?Chloride 98 - 111 mmol/L 105 107 103  ?CO2 22 - 32 mmol/L _0 ?Calcium 8.9 - 10.3 mg/dL 9.2 9.5 9.8  ?Total Protein 6.5 - 8.1 g/dL 7.1 6.9 7.7  ?Total Bilirubin 0.3 - 1.2 mg/dL 0.2(L) 0.3 0.5  ?Alkaline Phos 38 - 126 U/L 38 51 41  ?AST 15 - 41 U/L _1 ?ALT 0 - 44 U/L _2 ? ? ?Lab Results  ?Component Value Date  ? WBC 4.2 12/02/2021  ? HGB 12.1 12/02/2021  ? HCT 33.4 (L) 12/02/2021  ? MCV 89.5 12/02/2021  ? PLT 298 12/02/2021  ? NEUTROABS 1.9 12/02/2021  ? ? ?ASSESSMENT & PLAN:  ?Malignant neoplasm of upper-outer quadrant of right breast in female, estrogen receptor positive ([place]) ?10/08/2021: Large palpable lump in the right breast with deformity of  the nipple, mammogram and ultrasound revealed 5.1 cm mass central right breast, additional lesion 7 mm and 8 mm, suspicious, morphologically normal intramammary lymph node at 10:00, single right axillary lymph node (both were positive for IDC), pathology revealed grade 2 IDC ER 95%, PR 40%, Ki-67 60%, HER2 2+ by IHC, FISH negative   ?  ?Treatment plan: ?1.  Neoadjuvant chemotherapy with dose dense Adriamycin and Cytoxan followed by Taxol weekly x12   ?2. Mastectomy versus lumpectomy with targeted node dissection ?3.  Adjuvant radiation ?4.  Followed by adjuvant antiestrogen therapy (ovarian suppression) along with CDK 4 and 6 inhibitor ?5.  Genetic testing ?------------------------------------------------------------------------------------------------------------- ?  ?Current Treatment:  Cycle 4 Adriamycin and Cytoxan started 10/28/2018 ?CT chest angio: No PE, no mets ?ED visit 11/05/21: Severe back pain :CT Angio: No mets ?Bone scan 11/06/21 Bone scan: No mets ?MRI guided biopsies 10/29/21: Left breast bx: Benign ?   ?Chemo Toxicities: ?Mild nausea ?Skin discoloration of the hands and darkening of nailbeds ?3.  Severe bone pain: Resolved with discontinuation of Neulasta ?  ?  ?RTC in 3 weeks for cycle 1 Taxol ? ? ? ?No orders of the defined types were placed in this encounter. ? ?The patient has a good understanding of the overall plan. she agrees with it. she will call with any problems that may develop before the next visit here. ? ?Total time spent: 30 mins including face to face time and time spent for planning, charting and coordination of care ? ?Rulon Eisenmenger, MD, MPH ?12/23/2021 ? ?I, Thana Ates, am acting as scribe for Dr. [name]. ? ?I have reviewed the above documentation for accuracy and completeness, and I agree with the above. ? ? ? ? ? ? ?

## 2021-12-23 ENCOUNTER — Other Ambulatory Visit: Payer: Self-pay

## 2021-12-23 ENCOUNTER — Inpatient Hospital Stay: Payer: Medicaid Other | Admitting: Genetic Counselor

## 2021-12-23 ENCOUNTER — Inpatient Hospital Stay: Payer: Medicaid Other | Attending: Hematology and Oncology

## 2021-12-23 ENCOUNTER — Inpatient Hospital Stay: Payer: Medicaid Other

## 2021-12-23 ENCOUNTER — Inpatient Hospital Stay: Payer: Medicaid Other | Admitting: Hematology and Oncology

## 2021-12-23 ENCOUNTER — Telehealth: Payer: Self-pay | Admitting: Hematology and Oncology

## 2021-12-23 DIAGNOSIS — C50411 Malignant neoplasm of upper-outer quadrant of right female breast: Secondary | ICD-10-CM | POA: Diagnosis present

## 2021-12-23 DIAGNOSIS — Z170 Estrogen receptor positive status [ER+]: Secondary | ICD-10-CM

## 2021-12-23 DIAGNOSIS — Z79899 Other long term (current) drug therapy: Secondary | ICD-10-CM | POA: Insufficient documentation

## 2021-12-23 DIAGNOSIS — M898X9 Other specified disorders of bone, unspecified site: Secondary | ICD-10-CM | POA: Insufficient documentation

## 2021-12-23 DIAGNOSIS — R110 Nausea: Secondary | ICD-10-CM | POA: Diagnosis not present

## 2021-12-23 DIAGNOSIS — Z5111 Encounter for antineoplastic chemotherapy: Secondary | ICD-10-CM | POA: Diagnosis present

## 2021-12-23 DIAGNOSIS — Z95828 Presence of other vascular implants and grafts: Secondary | ICD-10-CM

## 2021-12-23 LAB — CBC WITH DIFFERENTIAL (CANCER CENTER ONLY)
Abs Immature Granulocytes: 0.03 10*3/uL (ref 0.00–0.07)
Basophils Absolute: 0 10*3/uL (ref 0.0–0.1)
Basophils Relative: 1 %
Eosinophils Absolute: 0.1 10*3/uL (ref 0.0–0.5)
Eosinophils Relative: 2 %
HCT: 32.7 % — ABNORMAL LOW (ref 36.0–46.0)
Hemoglobin: 11.5 g/dL — ABNORMAL LOW (ref 12.0–15.0)
Immature Granulocytes: 1 %
Lymphocytes Relative: 37 %
Lymphs Abs: 1.5 10*3/uL (ref 0.7–4.0)
MCH: 32.1 pg (ref 26.0–34.0)
MCHC: 35.2 g/dL (ref 30.0–36.0)
MCV: 91.3 fL (ref 80.0–100.0)
Monocytes Absolute: 0.7 10*3/uL (ref 0.1–1.0)
Monocytes Relative: 17 %
Neutro Abs: 1.7 10*3/uL (ref 1.7–7.7)
Neutrophils Relative %: 42 %
Platelet Count: 352 10*3/uL (ref 150–400)
RBC: 3.58 MIL/uL — ABNORMAL LOW (ref 3.87–5.11)
RDW: 13.9 % (ref 11.5–15.5)
WBC Count: 3.9 10*3/uL — ABNORMAL LOW (ref 4.0–10.5)
nRBC: 0 % (ref 0.0–0.2)

## 2021-12-23 LAB — CMP (CANCER CENTER ONLY)
ALT: 26 U/L (ref 0–44)
AST: 46 U/L — ABNORMAL HIGH (ref 15–41)
Albumin: 4.3 g/dL (ref 3.5–5.0)
Alkaline Phosphatase: 37 U/L — ABNORMAL LOW (ref 38–126)
Anion gap: 7 (ref 5–15)
BUN: 9 mg/dL (ref 6–20)
CO2: 26 mmol/L (ref 22–32)
Calcium: 9.5 mg/dL (ref 8.9–10.3)
Chloride: 109 mmol/L (ref 98–111)
Creatinine: 0.61 mg/dL (ref 0.44–1.00)
GFR, Estimated: >60 mL/min (ref >60)
Glucose, Bld: 101 mg/dL — ABNORMAL HIGH (ref 70–99)
Potassium: 3.5 mmol/L (ref 3.5–5.1)
Sodium: 142 mmol/L (ref 135–145)
Total Bilirubin: 0.4 mg/dL (ref 0.3–1.2)
Total Protein: 6.4 g/dL — ABNORMAL LOW (ref 6.5–8.1)

## 2021-12-23 LAB — GENETIC SCREENING ORDER

## 2021-12-23 LAB — PREGNANCY, URINE: Preg Test, Ur: NEGATIVE

## 2021-12-23 MED ORDER — SODIUM CHLORIDE 0.9 % IV SOLN
500.0000 mg/m2 | Freq: Once | INTRAVENOUS | Status: AC
  Administered 2021-12-23: 860 mg via INTRAVENOUS
  Filled 2021-12-23: qty 43

## 2021-12-23 MED ORDER — SODIUM CHLORIDE 0.9 % IV SOLN
10.0000 mg | Freq: Once | INTRAVENOUS | Status: AC
  Administered 2021-12-23: 10 mg via INTRAVENOUS
  Filled 2021-12-23: qty 10

## 2021-12-23 MED ORDER — HEPARIN SOD (PORK) LOCK FLUSH 100 UNIT/ML IV SOLN
500.0000 [IU] | Freq: Once | INTRAVENOUS | Status: AC | PRN
  Administered 2021-12-23: 500 [IU]

## 2021-12-23 MED ORDER — SODIUM CHLORIDE 0.9% FLUSH
10.0000 mL | Freq: Once | INTRAVENOUS | Status: AC
  Administered 2021-12-23: 10 mL

## 2021-12-23 MED ORDER — SODIUM CHLORIDE 0.9 % IV SOLN
150.0000 mg | Freq: Once | INTRAVENOUS | Status: AC
  Administered 2021-12-23: 150 mg via INTRAVENOUS
  Filled 2021-12-23: qty 150

## 2021-12-23 MED ORDER — SODIUM CHLORIDE 0.9% FLUSH
10.0000 mL | INTRAVENOUS | Status: DC | PRN
  Administered 2021-12-23: 10 mL

## 2021-12-23 MED ORDER — SODIUM CHLORIDE 0.9 % IV SOLN: Freq: Once | INTRAVENOUS | Status: AC

## 2021-12-23 MED ORDER — DOXORUBICIN HCL CHEMO IV INJECTION 2 MG/ML
50.0000 mg/m2 | Freq: Once | INTRAVENOUS | Status: AC
  Administered 2021-12-23: 86 mg via INTRAVENOUS
  Filled 2021-12-23: qty 43

## 2021-12-23 MED ORDER — PALONOSETRON HCL INJECTION 0.25 MG/5ML
0.2500 mg | Freq: Once | INTRAVENOUS | Status: AC
  Administered 2021-12-23: 0.25 mg via INTRAVENOUS
  Filled 2021-12-23: qty 5

## 2021-12-23 NOTE — Telephone Encounter (Signed)
Rescheduled today's genetic appointment. Patient is aware of changes. ?

## 2021-12-23 NOTE — Assessment & Plan Note (Signed)
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,?FISH negative?? ?? ?Treatment plan: ?1.??Neoadjuvant chemotherapy with dose dense Adriamycin and Cytoxan followed by Taxol weekly x12 ? ?2.?Mastectomy?versus lumpectomy with targeted node dissection ?3.??Adjuvant radiation ?4.??Followed by adjuvant antiestrogen therapy (ovarian suppression) along with CDK 4 and 6 inhibitor ?5.??Genetic testing ?------------------------------------------------------------------------------------------------------------- ?? ?Current Treatment: ?Cycle 3 Adriamycin and Cytoxan?started?10/28/2018 ?CT?chest angio: No PE, no mets ?ED visit 11/05/21: Severe back pain :CT Angio: No mets ?Bone scan 11/06/21 Bone scan: No mets ?MRI guided biopsies?10/29/21: Left breast bx: Benign ??? ?Chemo Toxicities: ?1. Mild nausea ?2. BPV: I will refer her to physical therapy ?3. Leukopenia: We will reduce the second cycle of chemo ?4. Thrombocytopenia: Chemo induced ?5.  Severe bone pain: We will discontinue Neulasta because of severe bone pain ?Therefore we will move her treatments to every 3 weeks. ?? ?RTC in 2 weeks for?cycle 4 ?

## 2022-01-05 ENCOUNTER — Other Ambulatory Visit: Payer: Medicaid Other

## 2022-01-05 ENCOUNTER — Encounter: Payer: Self-pay | Admitting: Genetic Counselor

## 2022-01-05 ENCOUNTER — Inpatient Hospital Stay (HOSPITAL_BASED_OUTPATIENT_CLINIC_OR_DEPARTMENT_OTHER): Payer: Medicaid Other | Admitting: Genetic Counselor

## 2022-01-05 ENCOUNTER — Other Ambulatory Visit: Payer: Self-pay

## 2022-01-05 DIAGNOSIS — C50411 Malignant neoplasm of upper-outer quadrant of right female breast: Secondary | ICD-10-CM

## 2022-01-05 DIAGNOSIS — Z170 Estrogen receptor positive status [ER+]: Secondary | ICD-10-CM | POA: Diagnosis not present

## 2022-01-05 NOTE — Progress Notes (Signed)
REFERRING PROVIDER: ?[name], MD ?[place] ?[place],  [place] [id number] ? ?PRIMARY PROVIDER:  ?Default, Provider, MD ? ?PRIMARY REASON FOR VISIT:  ?1. Malignant neoplasm of upper-outer quadrant of right breast in female, estrogen receptor positive ([facility])   ? ? ?HISTORY OF PRESENT ILLNESS:   ?Monica [name], a 26 y.o. female, was seen for a [facility] cancer genetics consultation at the request of Dr. Lindi Adie due to a personal and family history of cancer.  Monica [name] presents to clinic today to discuss the possibility of a hereditary predisposition to cancer, to discuss genetic testing, and to further clarify her future cancer risks, as well as potential cancer risks for family members.  ? 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  ? ?CANCER HISTORY:  ?Oncology History  ?Malignant neoplasm of upper-outer quadrant of right breast in female, estrogen receptor positive ([facility])  ?10/16/2021 Initial Diagnosis  ? Malignant neoplasm of upper-outer quadrant of right breast in female, estrogen receptor positive ([facility]) ?  ?10/16/2021 Cancer Staging  ? Staging form: Breast, AJCC 8th Edition ?- Clinical stage from 10/16/2021: Stage IIA (cT3, cN1, cM0, G2, ER+, PR+, HER2: Equivocal) - Signed by [name], MD on 10/16/2021 ?Histologic grading system: 3 grade system ? ?  ?10/28/2021 -  Chemotherapy  ? Patient is on Treatment Plan : BREAST ADJUVANT DOSE DENSE AC q14d / PACLitaxel q7d  ?   ? ? ?RISK FACTORS:  ?First live birth at age 19.  ?Ovaries intact: yes.  ?Uterus intact: yes.  ?Menopausal status: premenopausal.  ?HRT use: 0 years. ?Colonoscopy: no ?Mammogram within the last year: yes. ?Any excessive radiation exposure in the past: no ? ?Past Medical History:  ?Diagnosis Date  ? Abscess   ? Cancer ([facility]) 10/16/2021  ? right breast cancer  ? ? ?Past Surgical History:  ?Procedure Laterality Date  ? PORTACATH PLACEMENT Left 10/27/2021  ?  Procedure: INSERTION PORT-A-CATH;  Surgeon: Stark Klein, MD;  Location: [facility];  Service: General;  Laterality: Left;  ? ? ?Social History  ? ?Socioeconomic History  ? Marital status: Married  ?  Spouse name: Not on file  ? Number of children: Not on file  ? Years of education: Not on file  ? Highest education level: Not on file  ?Occupational History  ? Not on file  ?Tobacco Use  ? Smoking status: Never  ? Smokeless tobacco: Never  ?Vaping Use  ? Vaping Use: Never used  ?Substance and Sexual Activity  ? Alcohol use: No  ? Drug use: Never  ? Sexual activity: Yes  ?  Birth control/protection: Other-see comments  ?  Comment: Depo Shot  ?Other Topics Concern  ? Not on file  ?Social History Narrative  ? Not on file  ? ?Social Determinants of Health  ? ?Financial Resource Strain: Not on file  ?Food Insecurity: Not on file  ?Transportation Needs: Not on file  ?Physical Activity: Not on file  ?Stress: Not on file  ?Social Connections: Not on file  ?  ? ?FAMILY HISTORY:  ?We obtained a detailed, 4-generation family history.  Significant diagnoses are listed below: ?Family History  ?Problem Relation Age of Onset  ? Breast cancer Maternal Grandmother 51  ? ? ? ?Monica [name]'s maternal grandmother was diagnosed with breast cancer at age 17 and died at age 67. Of note, she does not have any information about her paternal family medical history. Monica [name] is unaware of  previous family history of genetic testing for hereditary cancer risks. There is no reported Ashkenazi Jewish ancestry.  ? ?GENETIC COUNSELING ASSESSMENT: Monica [name] is a 26 y.o. female with a personal and family history of cancer which is somewhat suggestive of a hereditary cancer syndrome and predisposition to cancer given her young age at diagnosis. We, therefore, discussed and recommended the following at today's visit.  ? ?DISCUSSION: We discussed that 5 - 10% of cancer is hereditary, with most cases of hereditary breast cancer associated with mutations in  BRCA1/2.  There are other genes that can be associated with hereditary breast cancer syndromes. Type of cancer risk and level of risk are gene-specific. We discussed that testing is beneficial for several reasons including knowing how to follow individuals after completing their treatment, identifying whether potential treatment options would be beneficial, and understanding if other family members could be at risk for cancer and allowing them to undergo genetic testing.  ? ?We reviewed the characteristics, features and inheritance patterns of hereditary cancer syndromes. We also discussed genetic testing, including the appropriate family members to test, the process of testing, insurance coverage and turn-around-time for results. We discussed the implications of a negative, positive and/or variant of uncertain significant result. In order to get genetic test results in a timely manner so that Monica [name] can use these genetic test results for surgical decisions, we recommended Monica [name] pursue genetic testing for the BRCAplus. Once complete, we recommend Monica [name] pursue reflex genetic testing to a more comprehensive gene panel.  ? ?Monica [name]  was offered a common hereditary cancer panel (47 genes) and an expanded pan-cancer panel (77 genes). Monica [name] was informed of the benefits and limitations of each panel, including that expanded pan-cancer panels contain genes that do not have clear management guidelines at this point in time.  We also discussed that as the number of genes included on a panel increases, the chances of variants of uncertain significance increases.  After considering the benefits and limitations of each gene panel, Monica [name] elected to have [facility] CancerNext-Expanded Panel.  ? ?The CancerNext-Expanded gene panel offered by [facility] and includes sequencing, rearrangement, and RNA analysis for the following 77 genes: AIP, ALK, APC, ATM, AXIN2, BAP1, BARD1, BLM, BMPR1A, BRCA1,  BRCA2, BRIP1, CDC73, CDH1, CDK4, CDKN1B, CDKN2A, CHEK2, CTNNA1, DICER1, FANCC, FH, FLCN, GALNT12, KIF1B, LZTR1, MAX, MEN1, MET, MLH1, MSH2, MSH3, MSH6, MUTYH, NBN, NF1, NF2, NTHL1, PALB2, PHOX2B, PMS2, POT1, PRKAR1A, PTCH1, PTEN, RAD51C, RAD51D, RB1, RECQL, RET, SDHA, SDHAF2, SDHB, SDHC, SDHD, SMAD4, SMARCA4, SMARCB1, SMARCE1, STK11, SUFU, TMEM127, TP53, TSC1, TSC2, VHL and XRCC2 (sequencing and deletion/duplication); EGFR, EGLN1, HOXB13, KIT, MITF, PDGFRA, POLD1, and POLE (sequencing only); EPCAM and GREM1 (deletion/duplication only).  ? ?Based on Monica [name]'s personal and family history of cancer, she meets medical criteria for genetic testing. Her testing should be of no cost because she currently has [facility] [employer].   ? ?PLAN: After considering the risks, benefits, and limitations, Monica [name] provided informed consent to pursue genetic testing and the blood sample was sent to [facility] for analysis of the CancerNext-Expanded Panel. Results should be available within approximately 1-2 weeks' time, at which point they will be disclosed by telephone to Monica [name], as will any additional recommendations warranted by these results. Monica [name] will receive a summary of her genetic counseling visit and a copy of her results once available. This information will also be available in Epic.  ? ?Monica [name]'s questions were answered to  her satisfaction today. Our contact information was provided should additional questions or concerns arise. Thank you for the referral and allowing [place] to share in the care of your patient.  ? ?Lucille Passy, MS, [place] ?Genetic Counselor ?[email or web address] ?[phone] ? ?The patient was seen for a total of 30 minutes in face-to-face genetic counseling.  The patient was seen alone.  Drs. Lindi Adie and/or Burr Medico were available to discuss this case as needed.  ?_______________________________________________________________________ ?For Office Staff:  ?Number  of people involved in session: 1 ?Was an Intern/ student involved with case: no ? ?

## 2022-01-12 NOTE — Progress Notes (Signed)
? ?Patient Care Team: ?Default, Provider, MD as PCP - General ?[name], RN as Oncology Nurse Navigator ?[name], RN as Oncology Nurse Navigator ? ?DIAGNOSIS:  ?Encounter Diagnosis  ?Name Primary?  ? Malignant neoplasm of upper-outer quadrant of right breast in female, estrogen receptor positive ([place])   ? ? ?SUMMARY OF ONCOLOGIC HISTORY: ?Oncology History  ?Malignant neoplasm of upper-outer quadrant of right breast in female, estrogen receptor positive ([facility])  ?10/16/2021 Initial Diagnosis  ? Malignant neoplasm of upper-outer quadrant of right breast in female, estrogen receptor positive ([place]) ?  ?10/16/2021 Cancer Staging  ? Staging form: Breast, AJCC 8th Edition ?- Clinical stage from 10/16/2021: Stage IIA (cT3, cN1, cM0, G2, ER+, PR+, HER2: Equivocal) - Signed by [name], MD on 10/16/2021 ?Histologic grading system: 3 grade system ? ?  ?10/28/2021 -  Chemotherapy  ? Patient is on Treatment Plan : BREAST ADJUVANT DOSE DENSE AC q14d / PACLitaxel q7d  ?   ? ? ?CHIEF COMPLIANT: right breast cancer cycle 1 Taxol (delayed till 01/14/2022) ? ?INTERVAL HISTORY: Monica [name] is a 26 y.o. with above-mentioned history of ER/PR positive right breast cancer, currently on chemotherapy with dose dense cycle 1 Taxol. She presents to the clinic today for treatment.  She came in late for her chemo appointment today and therefore.  Chemo cannot be given today.  She is going to be treated tomorrow.  She reports no new problems or concerns after the last cycle of Adriamycin and Cytoxan. ? ? ?ALLERGIES:  has No Known Allergies. ? ?MEDICATIONS:  ?Current Outpatient Medications  ?Medication Sig Dispense Refill  ? dexamethasone (DECADRON) 4 MG tablet Take 1 tablet (4 mg total) by mouth daily. Take 1 tablet day after chemo and 1 tablet 2 days after chemo with food 8 tablet 0  ? gabapentin (NEURONTIN) 100 MG capsule Take 100 mg by mouth daily.    ? lidocaine-prilocaine (EMLA) cream Apply to affected area once 30 g 3   ? LORazepam (ATIVAN) 0.5 MG tablet Take 1 tablet (0.5 mg total) by mouth at bedtime as needed for sleep. 30 tablet 0  ? naproxen (NAPROSYN) 500 MG tablet Take 500 mg by mouth 2 (two) times daily.    ? ondansetron (ZOFRAN) 8 MG tablet Take 1 tablet (8 mg total) by mouth 2 (two) times daily as needed. Start on the third day after chemotherapy. 30 tablet 1  ? prochlorperazine (COMPAZINE) 10 MG tablet Take 1 tablet (10 mg total) by mouth every 6 (six) hours as needed (Nausea or vomiting). 30 tablet 1  ? VENTOLIN HFA 108 (90 Base) MCG/ACT inhaler     ? ?No current facility-administered medications for this visit.  ? ? ?PHYSICAL EXAMINATION: ?ECOG PERFORMANCE STATUS: 1 - Symptomatic but completely ambulatory ? ?Vitals:  ? 01/13/22 1022  ?BP: (!) 141/88  ?Pulse: 82  ?Resp: 17  ?Temp: 97.7 ?F (36.5 ?C)  ?SpO2: 98%  ? ?Filed Weights  ? 01/13/22 1022  ?Weight: 138 lb 6.4 oz (62.8 kg)  ? ?  ? ?LABORATORY DATA:  ?I have reviewed the data as listed ? ?  Latest Ref Rng & Units 12/23/2021  ?  9:16 AM 12/02/2021  ?  9:16 AM 11/11/2021  ? 11:26 AM  ?CMP  ?Glucose 70 - 99 mg/dL 101   94   97    ?BUN 6 - 20 mg/dL 9   13   12     ?Creatinine 0.44 - 1.00 mg/dL 0.61   0.47   0.65    ?  Sodium 135 - 145 mmol/L 142   136   140    ?Potassium 3.5 - 5.1 mmol/L 3.5   3.7   3.8    ?Chloride 98 - 111 mmol/L 109   105   107    ?CO2 22 - 32 mmol/L 26   24   26     ?Calcium 8.9 - 10.3 mg/dL 9.5   9.2   9.5    ?Total Protein 6.5 - 8.1 g/dL 6.4   7.1   6.9    ?Total Bilirubin 0.3 - 1.2 mg/dL 0.4   0.2   0.3    ?Alkaline Phos 38 - 126 U/L 37   38   51    ?AST 15 - 41 U/L 46   20   16    ?ALT 0 - 44 U/L 26   13   8     ? ? ?Lab Results  ?Component Value Date  ? WBC 3.4 (L) 01/13/2022  ? HGB 11.6 (L) 01/13/2022  ? HCT 33.5 (L) 01/13/2022  ? MCV 92.0 01/13/2022  ? PLT 345 01/13/2022  ? NEUTROABS 1.3 (L) 01/13/2022  ? ? ?ASSESSMENT & PLAN:  ?Malignant neoplasm of upper-outer quadrant of right breast in female, estrogen receptor positive ([place]) ?10/08/2021: Large  palpable lump in the right breast with deformity of the nipple, mammogram and ultrasound revealed 5.1 cm mass central right breast, additional lesion 7 mm and 8 mm, suspicious, morphologically normal intramammary lymph node at 10:00, single right axillary lymph node (both were positive for IDC), pathology revealed grade 2 IDC ER 95%, PR 40%, Ki-67 60%, HER2 2+ by IHC, FISH negative   ?  ?Treatment plan: ?1.  Neoadjuvant chemotherapy with dose dense Adriamycin and Cytoxan followed by Taxol weekly x12   ?2. Mastectomy versus lumpectomy with targeted node dissection ?3.  Adjuvant radiation ?4.  Followed by adjuvant antiestrogen therapy (ovarian suppression) along with CDK 4 and 6 inhibitor ?5.  Genetic testing ?------------------------------------------------------------------------------------------------------------- ?  ?Current Treatment: Completed 4 cycles of dose dense Adriamycin and Cytoxan started 10/28/2018, today cycle 1 Taxol (postponed till 01/14/2022) ?CT chest angio: No PE, no mets ?ED visit 11/05/21: Severe back pain :CT Angio: No mets ?Bone scan 11/06/21 Bone scan: No mets ?MRI guided biopsies 10/29/21: Left breast bx: Benign ?   ?Chemo Toxicities: ?1.  Because Neulasta was causing pain we discontinued it.  Therefore today's ANC is 1.3. ?Okay to treat with ANC 1.3. ?Because patient was late coming into the infusion, we could not treat her today she has an appointment tomorrow at 1230 to receive her chemo. ?2. neuropathy prevention: She is bringing an ice gloves and socks. ? ?RTC weekly for chemo and every other week for follow-up with me. ? ? ? ?No orders of the defined types were placed in this encounter. ? ?The patient has a good understanding of the overall plan. she agrees with it. she will call with any problems that may develop before the next visit here. ?Total time spent: 30 mins including face to face time and time spent for planning, charting and co-ordination of care ? ? Harriette Ohara,  MD ?01/13/22 ? ? ? I Gardiner Coins am scribing for Dr. Lindi Adie ? ?I have reviewed the above documentation for accuracy and completeness, and I agree with the above. ?  ?

## 2022-01-13 ENCOUNTER — Inpatient Hospital Stay (HOSPITAL_BASED_OUTPATIENT_CLINIC_OR_DEPARTMENT_OTHER): Payer: Medicaid Other | Admitting: Hematology and Oncology

## 2022-01-13 ENCOUNTER — Inpatient Hospital Stay: Payer: Medicaid Other

## 2022-01-13 ENCOUNTER — Other Ambulatory Visit: Payer: Self-pay

## 2022-01-13 DIAGNOSIS — Z5111 Encounter for antineoplastic chemotherapy: Secondary | ICD-10-CM | POA: Diagnosis not present

## 2022-01-13 DIAGNOSIS — Z170 Estrogen receptor positive status [ER+]: Secondary | ICD-10-CM

## 2022-01-13 DIAGNOSIS — Z95828 Presence of other vascular implants and grafts: Secondary | ICD-10-CM

## 2022-01-13 DIAGNOSIS — C50411 Malignant neoplasm of upper-outer quadrant of right female breast: Secondary | ICD-10-CM | POA: Diagnosis not present

## 2022-01-13 LAB — CBC WITH DIFFERENTIAL (CANCER CENTER ONLY)
Abs Immature Granulocytes: 0.01 10*3/uL (ref 0.00–0.07)
Basophils Absolute: 0 10*3/uL (ref 0.0–0.1)
Basophils Relative: 1 %
Eosinophils Absolute: 0.1 10*3/uL (ref 0.0–0.5)
Eosinophils Relative: 2 %
HCT: 33.5 % — ABNORMAL LOW (ref 36.0–46.0)
Hemoglobin: 11.6 g/dL — ABNORMAL LOW (ref 12.0–15.0)
Immature Granulocytes: 0 %
Lymphocytes Relative: 38 %
Lymphs Abs: 1.3 10*3/uL (ref 0.7–4.0)
MCH: 31.9 pg (ref 26.0–34.0)
MCHC: 34.6 g/dL (ref 30.0–36.0)
MCV: 92 fL (ref 80.0–100.0)
Monocytes Absolute: 0.7 10*3/uL (ref 0.1–1.0)
Monocytes Relative: 20 %
Neutro Abs: 1.3 10*3/uL — ABNORMAL LOW (ref 1.7–7.7)
Neutrophils Relative %: 39 %
Platelet Count: 345 10*3/uL (ref 150–400)
RBC: 3.64 MIL/uL — ABNORMAL LOW (ref 3.87–5.11)
RDW: 13.6 % (ref 11.5–15.5)
WBC Count: 3.4 10*3/uL — ABNORMAL LOW (ref 4.0–10.5)
nRBC: 0 % (ref 0.0–0.2)

## 2022-01-13 LAB — CMP (CANCER CENTER ONLY)
ALT: 7 U/L (ref 0–44)
AST: 17 U/L (ref 15–41)
Albumin: 4.3 g/dL (ref 3.5–5.0)
Alkaline Phosphatase: 35 U/L — ABNORMAL LOW (ref 38–126)
Anion gap: 6 (ref 5–15)
BUN: 11 mg/dL (ref 6–20)
CO2: 26 mmol/L (ref 22–32)
Calcium: 9.6 mg/dL (ref 8.9–10.3)
Chloride: 107 mmol/L (ref 98–111)
Creatinine: 0.65 mg/dL (ref 0.44–1.00)
GFR, Estimated: >60 mL/min (ref >60)
Glucose, Bld: 98 mg/dL (ref 70–99)
Potassium: 3.9 mmol/L (ref 3.5–5.1)
Sodium: 139 mmol/L (ref 135–145)
Total Bilirubin: 0.4 mg/dL (ref 0.3–1.2)
Total Protein: 6.7 g/dL (ref 6.5–8.1)

## 2022-01-13 LAB — PREGNANCY, URINE: Preg Test, Ur: NEGATIVE

## 2022-01-13 MED ORDER — SODIUM CHLORIDE 0.9% FLUSH
10.0000 mL | Freq: Once | INTRAVENOUS | Status: AC
  Administered 2022-01-13: 10 mL

## 2022-01-13 NOTE — Assessment & Plan Note (Signed)
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,?FISH negative?? ?? ?Treatment plan: ?1.??Neoadjuvant chemotherapy with dose dense Adriamycin and Cytoxan followed by Taxol weekly x12 ? ?2.?Mastectomy?versus lumpectomy with targeted node dissection ?3.??Adjuvant radiation ?4.??Followed by adjuvant antiestrogen therapy (ovarian suppression) along with CDK 4 and 6 inhibitor ?5.??Genetic testing ?------------------------------------------------------------------------------------------------------------- ?? ?Current Treatment: Completed 4 cycles of dose dense Adriamycin and Cytoxan started 10/28/2018, today cycle 1 Taxol (postponed till 01/14/2022) ?CT?chest angio: No PE, no mets ?ED visit 11/05/21: Severe back pain :CT Angio: No mets ?Bone scan 11/06/21 Bone scan: No mets ?MRI guided biopsies?10/29/21: Left breast bx: Benign ??? ?Chemo Toxicities: ?1.  Because Neulasta was causing pain we discontinued it.  Therefore today's ANC is 1.3. ?Okay to treat with ANC 1.3. ?Because patient was late coming into the infusion, we could not treat her today she has an appointment tomorrow at 1230 to receive her chemo. ?2. neuropathy prevention: She is bringing an ice gloves and socks. ? ?RTC weekly for chemo and every other week for follow-up with me. ?

## 2022-01-14 ENCOUNTER — Telehealth: Payer: Self-pay | Admitting: Genetic Counselor

## 2022-01-14 ENCOUNTER — Inpatient Hospital Stay: Payer: Medicaid Other

## 2022-01-14 ENCOUNTER — Encounter: Payer: Self-pay | Admitting: Genetic Counselor

## 2022-01-14 VITALS — BP 110/79 | HR 79 | Temp 98.9°F | Resp 16

## 2022-01-14 DIAGNOSIS — Z1501 Genetic susceptibility to malignant neoplasm of breast: Secondary | ICD-10-CM | POA: Insufficient documentation

## 2022-01-14 DIAGNOSIS — Z5111 Encounter for antineoplastic chemotherapy: Secondary | ICD-10-CM | POA: Diagnosis not present

## 2022-01-14 DIAGNOSIS — C50411 Malignant neoplasm of upper-outer quadrant of right female breast: Secondary | ICD-10-CM

## 2022-01-14 DIAGNOSIS — Z1379 Encounter for other screening for genetic and chromosomal anomalies: Secondary | ICD-10-CM | POA: Insufficient documentation

## 2022-01-14 MED ORDER — FAMOTIDINE IN NACL 20-0.9 MG/50ML-% IV SOLN
20.0000 mg | Freq: Once | INTRAVENOUS | Status: AC
  Administered 2022-01-14: 20 mg via INTRAVENOUS
  Filled 2022-01-14: qty 50

## 2022-01-14 MED ORDER — SODIUM CHLORIDE 0.9 % IV SOLN: Freq: Once | INTRAVENOUS | Status: AC

## 2022-01-14 MED ORDER — HEPARIN SOD (PORK) LOCK FLUSH 100 UNIT/ML IV SOLN
500.0000 [IU] | Freq: Once | INTRAVENOUS | Status: AC | PRN
  Administered 2022-01-14: 500 [IU]

## 2022-01-14 MED ORDER — DIPHENHYDRAMINE HCL 50 MG/ML IJ SOLN
50.0000 mg | Freq: Once | INTRAMUSCULAR | Status: AC
  Administered 2022-01-14: 50 mg via INTRAVENOUS
  Filled 2022-01-14: qty 1

## 2022-01-14 MED ORDER — SODIUM CHLORIDE 0.9 % IV SOLN
10.0000 mg | Freq: Once | INTRAVENOUS | Status: AC
  Administered 2022-01-14: 10 mg via INTRAVENOUS
  Filled 2022-01-14: qty 10

## 2022-01-14 MED ORDER — SODIUM CHLORIDE 0.9% FLUSH
10.0000 mL | INTRAVENOUS | Status: DC | PRN
  Administered 2022-01-14: 10 mL

## 2022-01-14 MED ORDER — SODIUM CHLORIDE 0.9 % IV SOLN
80.0000 mg/m2 | Freq: Once | INTRAVENOUS | Status: AC
  Administered 2022-01-14: 138 mg via INTRAVENOUS
  Filled 2022-01-14: qty 23

## 2022-01-14 NOTE — Patient Instructions (Signed)
[facility]  Discharge Instructions: ?Thank you for choosing [facility] to provide your oncology and hematology care.  ? ?If you have a lab appointment with the [facility], please go directly to the [facility] and check in at the registration area. ?  ?Wear comfortable clothing and clothing appropriate for easy access to any Portacath or PICC line.  ? ?We strive to give you quality time with your provider. You may need to reschedule your appointment if you arrive late (15 or more minutes).  Arriving late affects you and other patients whose appointments are after yours.  Also, if you miss three or more appointments without notifying the office, you may be dismissed from the clinic at the provider?s discretion.    ?  ?For prescription refill requests, have your pharmacy contact our office and allow 72 hours for refills to be completed.   ? ?Today you received the following chemotherapy and/or immunotherapy agents Taxol    ?  ?To help prevent nausea and vomiting after your treatment, we encourage you to take your nausea medication as directed. ? ?BELOW ARE SYMPTOMS THAT SHOULD BE REPORTED IMMEDIATELY: ?*FEVER GREATER THAN 100.4 F (38 ?C) OR HIGHER ?*CHILLS OR SWEATING ?*NAUSEA AND VOMITING THAT IS NOT CONTROLLED WITH YOUR NAUSEA MEDICATION ?*UNUSUAL SHORTNESS OF BREATH ?*UNUSUAL BRUISING OR BLEEDING ?*URINARY PROBLEMS (pain or burning when urinating, or frequent urination) ?*BOWEL PROBLEMS (unusual diarrhea, constipation, pain near the anus) ?TENDERNESS IN MOUTH AND THROAT WITH OR WITHOUT PRESENCE OF ULCERS (sore throat, sores in mouth, or a toothache) ?UNUSUAL RASH, SWELLING OR PAIN  ?UNUSUAL VAGINAL DISCHARGE OR ITCHING  ? ?Items with * indicate a potential emergency and should be followed up as soon as possible or go to the Emergency Department if any problems should occur. ? ?Please show the CHEMOTHERAPY ALERT CARD or IMMUNOTHERAPY ALERT CARD at check-in to the  Emergency Department and triage nurse. ? ?Should you have questions after your visit or need to cancel or reschedule your appointment, please contact [facility]  Dept: [phone]  and follow the prompts.  Office hours are 8:00 a.m. to 4:30 p.m. Monday - Friday. Please note that voicemails left after 4:00 p.m. may not be returned until the following business day.  We are closed weekends and major holidays. You have access to a nurse at all times for urgent questions. Please call the main number to the clinic Dept: [phone] and follow the prompts. ? ? ?For any non-urgent questions, you may also contact your provider using MyChart. We now offer e-Visits for anyone 7 and older to request care online for non-urgent symptoms. For details visit [email or web address]. ?  ?Also download the MyChart app! Go to the app store, search "MyChart", open the app, select [facility], and log in with your MyChart username and password. ? ?Due to Covid, a mask is required upon entering the hospital/clinic. If you do not have a mask, one will be given to you upon arrival. For doctor visits, patients may have 1 support person aged 38 or older with them. For treatment visits, patients cannot have anyone with them due to current Covid guidelines and our immunocompromised population.  ? ? ?Paclitaxel injection ?What is this medication? ?PACLITAXEL (PAK li TAX el) is a chemotherapy drug. It targets fast dividing cells, like cancer cells, and causes these cells to die. This medicine is used to treat ovarian cancer, breast cancer, lung cancer, Kaposi's sarcoma, and other cancers. ?This medicine  may be used for other purposes; ask your health care provider or pharmacist if you have questions. ?COMMON BRAND NAME(S): Onxol, Taxol ?What should I tell my care team before I take this medication? ?They need to know if you have any of these conditions: ?history of irregular heartbeat ?liver disease ?low blood  counts, like low white cell, platelet, or red cell counts ?lung or breathing disease, like asthma ?tingling of the fingers or toes, or other nerve disorder ?an unusual or allergic reaction to paclitaxel, alcohol, polyoxyethylated castor oil, other chemotherapy, other medicines, foods, dyes, or preservatives ?pregnant or trying to get pregnant ?breast-feeding ?How should I use this medication? ?This drug is given as an infusion into a vein. It is administered in a hospital or clinic by a specially trained health care professional. ?Talk to your pediatrician regarding the use of this medicine in children. Special care may be needed. ?Overdosage: If you think you have taken too much of this medicine contact a poison control center or emergency room at once. ?NOTE: This medicine is only for you. Do not share this medicine with others. ?What if I miss a dose? ?It is important not to miss your dose. Call your doctor or health care professional if you are unable to keep an appointment. ?What may interact with this medication? ?Do not take this medicine with any of the following medications: ?live virus vaccines ?This medicine may also interact with the following medications: ?antiviral medicines for hepatitis, HIV or AIDS ?certain antibiotics like erythromycin and clarithromycin ?certain medicines for fungal infections like ketoconazole and itraconazole ?certain medicines for seizures like carbamazepine, phenobarbital, phenytoin ?gemfibrozil ?nefazodone ?rifampin ?St. John's wort ?This list may not describe all possible interactions. Give your health care provider a list of all the medicines, herbs, non-prescription drugs, or dietary supplements you use. Also tell them if you smoke, drink alcohol, or use illegal drugs. Some items may interact with your medicine. ?What should I watch for while using this medication? ?Your condition will be monitored carefully while you are receiving this medicine. You will need important  blood work done while you are taking this medicine. ?This medicine can cause serious allergic reactions. To reduce your risk you will need to take other medicine(s) before treatment with this medicine. If you experience allergic reactions like skin rash, itching or hives, swelling of the face, lips, or tongue, tell your doctor or health care professional right away. ?In some cases, you may be given additional medicines to help with side effects. Follow all directions for their use. ?This drug may make you feel generally unwell. This is not uncommon, as chemotherapy can affect healthy cells as well as cancer cells. Report any side effects. Continue your course of treatment even though you feel ill unless your doctor tells you to stop. ?Call your doctor or health care professional for advice if you get a fever, chills or sore throat, or other symptoms of a cold or flu. Do not treat yourself. This drug decreases your body's ability to fight infections. Try to avoid being around people who are sick. ?This medicine may increase your risk to bruise or bleed. Call your doctor or health care professional if you notice any unusual bleeding. ?Be careful brushing and flossing your teeth or using a toothpick because you may get an infection or bleed more easily. If you have any dental work done, tell your dentist you are receiving this medicine. ?Avoid taking products that contain aspirin, acetaminophen, ibuprofen, naproxen, or ketoprofen unless instructed by  your doctor. These medicines may hide a fever. ?Do not become pregnant while taking this medicine. Women should inform their doctor if they wish to become pregnant or think they might be pregnant. There is a potential for serious side effects to an unborn child. Talk to your health care professional or pharmacist for more information. Do not breast-feed an infant while taking this medicine. ?Men are advised not to father a child while receiving this medicine. ?This product  may contain alcohol. Ask your pharmacist or healthcare provider if this medicine contains alcohol. Be sure to tell all healthcare providers you are taking this medicine. Certain medicines, like metronidaz

## 2022-01-14 NOTE — Telephone Encounter (Signed)
I contacted Ms. Cadenas to discuss her genetic testing results. 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 We reviewed the cancer risks and management recommendations for individuals with a BRCA2 gene mutation. Of note, we are still waiting on additional gene analysis and will contact her when they are available. Detailed clinic note to follow. ? ?The test report has been scanned into EPIC and is located under the Molecular Pathology section of the Results Review tab.  ? ?Lucille Passy, MS, [place] ?Genetic Counselor ?[email or web address] ?(P) [phone] ? ? ?

## 2022-01-15 ENCOUNTER — Telehealth: Payer: Self-pay

## 2022-01-15 NOTE — Telephone Encounter (Signed)
LM for patient that this nurse was calling to see how they were doing after their treatment. Please call back to Dr. Gudena's nurse at [phone] if they have any questions or concerns regarding the treatment. 

## 2022-01-15 NOTE — Telephone Encounter (Signed)
-----   Message from Derrick Ravel, RN sent at 01/14/2022  4:35 PM EDT ----- ?Regarding: First time Taxol, Dr. Lindi Adie patient. ?First time taxol infusion, Dr. Lindi Adie patient. She tolerated the infusion well, please give follow up phone call tomorrow. Thanks! ? ? ?

## 2022-01-20 ENCOUNTER — Other Ambulatory Visit: Payer: Self-pay

## 2022-01-20 ENCOUNTER — Inpatient Hospital Stay: Payer: Medicaid Other

## 2022-01-20 ENCOUNTER — Inpatient Hospital Stay (HOSPITAL_BASED_OUTPATIENT_CLINIC_OR_DEPARTMENT_OTHER): Payer: Medicaid Other | Admitting: Adult Health

## 2022-01-20 ENCOUNTER — Encounter: Payer: Self-pay | Admitting: Adult Health

## 2022-01-20 ENCOUNTER — Inpatient Hospital Stay: Payer: Medicaid Other | Attending: Hematology and Oncology

## 2022-01-20 VITALS — BP 119/83 | HR 93 | Temp 97.5°F | Resp 18 | Ht 66.0 in | Wt 140.4 lb

## 2022-01-20 DIAGNOSIS — Z5111 Encounter for antineoplastic chemotherapy: Secondary | ICD-10-CM | POA: Diagnosis not present

## 2022-01-20 DIAGNOSIS — R197 Diarrhea, unspecified: Secondary | ICD-10-CM | POA: Diagnosis not present

## 2022-01-20 DIAGNOSIS — C50411 Malignant neoplasm of upper-outer quadrant of right female breast: Secondary | ICD-10-CM | POA: Insufficient documentation

## 2022-01-20 DIAGNOSIS — Z170 Estrogen receptor positive status [ER+]: Secondary | ICD-10-CM

## 2022-01-20 DIAGNOSIS — Z95828 Presence of other vascular implants and grafts: Secondary | ICD-10-CM

## 2022-01-20 DIAGNOSIS — Z79899 Other long term (current) drug therapy: Secondary | ICD-10-CM | POA: Insufficient documentation

## 2022-01-20 LAB — PREGNANCY, URINE: Preg Test, Ur: NEGATIVE

## 2022-01-20 LAB — CMP (CANCER CENTER ONLY)
ALT: 66 U/L — ABNORMAL HIGH (ref 0–44)
AST: 130 U/L — ABNORMAL HIGH (ref 15–41)
Albumin: 4.2 g/dL (ref 3.5–5.0)
Alkaline Phosphatase: 33 U/L — ABNORMAL LOW (ref 38–126)
Anion gap: 6 (ref 5–15)
BUN: 11 mg/dL (ref 6–20)
CO2: 27 mmol/L (ref 22–32)
Calcium: 9.4 mg/dL (ref 8.9–10.3)
Chloride: 107 mmol/L (ref 98–111)
Creatinine: 0.58 mg/dL (ref 0.44–1.00)
GFR, Estimated: >60 mL/min (ref >60)
Glucose, Bld: 111 mg/dL — ABNORMAL HIGH (ref 70–99)
Potassium: 3.7 mmol/L (ref 3.5–5.1)
Sodium: 140 mmol/L (ref 135–145)
Total Bilirubin: 0.5 mg/dL (ref 0.3–1.2)
Total Protein: 6.6 g/dL (ref 6.5–8.1)

## 2022-01-20 LAB — CBC WITH DIFFERENTIAL (CANCER CENTER ONLY)
Abs Immature Granulocytes: 0.03 10*3/uL (ref 0.00–0.07)
Basophils Absolute: 0 10*3/uL (ref 0.0–0.1)
Basophils Relative: 1 %
Eosinophils Absolute: 0.1 10*3/uL (ref 0.0–0.5)
Eosinophils Relative: 2 %
HCT: 30.8 % — ABNORMAL LOW (ref 36.0–46.0)
Hemoglobin: 10.9 g/dL — ABNORMAL LOW (ref 12.0–15.0)
Immature Granulocytes: 1 %
Lymphocytes Relative: 27 %
Lymphs Abs: 1 10*3/uL (ref 0.7–4.0)
MCH: 32.9 pg (ref 26.0–34.0)
MCHC: 35.4 g/dL (ref 30.0–36.0)
MCV: 93.1 fL (ref 80.0–100.0)
Monocytes Absolute: 0.3 10*3/uL (ref 0.1–1.0)
Monocytes Relative: 8 %
Neutro Abs: 2.3 10*3/uL (ref 1.7–7.7)
Neutrophils Relative %: 61 %
Platelet Count: 213 10*3/uL (ref 150–400)
RBC: 3.31 MIL/uL — ABNORMAL LOW (ref 3.87–5.11)
RDW: 13.4 % (ref 11.5–15.5)
WBC Count: 3.7 10*3/uL — ABNORMAL LOW (ref 4.0–10.5)
nRBC: 0 % (ref 0.0–0.2)

## 2022-01-20 MED ORDER — SODIUM CHLORIDE 0.9 % IV SOLN
80.0000 mg/m2 | Freq: Once | INTRAVENOUS | Status: AC
  Administered 2022-01-20: 138 mg via INTRAVENOUS
  Filled 2022-01-20: qty 23

## 2022-01-20 MED ORDER — SODIUM CHLORIDE 0.9 % IV SOLN: Freq: Once | INTRAVENOUS | Status: AC

## 2022-01-20 MED ORDER — SODIUM CHLORIDE 0.9% FLUSH
10.0000 mL | Freq: Once | INTRAVENOUS | Status: AC
  Administered 2022-01-20: 10 mL

## 2022-01-20 MED ORDER — SODIUM CHLORIDE 0.9% FLUSH
10.0000 mL | INTRAVENOUS | Status: DC | PRN
  Administered 2022-01-20: 10 mL

## 2022-01-20 MED ORDER — HEPARIN SOD (PORK) LOCK FLUSH 100 UNIT/ML IV SOLN
500.0000 [IU] | Freq: Once | INTRAVENOUS | Status: AC | PRN
  Administered 2022-01-20: 500 [IU]

## 2022-01-20 MED ORDER — FAMOTIDINE IN NACL 20-0.9 MG/50ML-% IV SOLN
20.0000 mg | Freq: Once | INTRAVENOUS | Status: AC
  Administered 2022-01-20: 20 mg via INTRAVENOUS
  Filled 2022-01-20: qty 50

## 2022-01-20 MED ORDER — DIPHENHYDRAMINE HCL 50 MG/ML IJ SOLN
50.0000 mg | Freq: Once | INTRAMUSCULAR | Status: AC
  Administered 2022-01-20: 50 mg via INTRAVENOUS
  Filled 2022-01-20: qty 1

## 2022-01-20 MED ORDER — SODIUM CHLORIDE 0.9 % IV SOLN
10.0000 mg | Freq: Once | INTRAVENOUS | Status: AC
  Administered 2022-01-20: 10 mg via INTRAVENOUS
  Filled 2022-01-20: qty 10

## 2022-01-20 NOTE — Progress Notes (Signed)
Monica [name], Provider, Monica ?No address on file ? ? 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 ? 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  ?10/16/2021 Cancer Staging  ? Staging form: [name], Monica [name], Monica [name], cM0, G2, ER+, PR+, HER2: Equivocal) - Signed by [name], Monica on 10/16/2021 ?Histologic grading system: 3 grade system ? ?  ?10/28/2021 -  Chemotherapy  ? Patient is on Treatment Plan : [name] ADJUVANT DOSE DENSE AC q14d / PACLitaxel q7d  ?   ? Genetic Testing  ? Ceph[facility] BRCAplus identified a single pathogenic variant in the BRCA2 gene ([id number]*). Report date is 01/13/2022. ? ?The BRCAplus panel offered by [employer] and includes sequencing and deletion/duplication analysis for the following 8 genes: ATM, BRCA1, BRCA2, CDH1, CHEK2, PALB2, PTEN, and TP53. ?  ? ? ?CURRENT THERAPY: Neoadjuvant chemotherapy ? ?INTERVAL HISTORY: ?Monica [name] 26 y.o. [name] returns for follow-up and evaluation prior to receiving her weekly Taxol.  She began receiving Taxol last week.  She is tolerating the treatment moderately well.  She notes a mild amount of fatigue otherwise she is feeling well.  She did have some body aches and pains after this first dose of Taxol and wonders what she could do for that.  She denies any significant issues with peripheral neuropathy and knows  what to look for to determine if she is developing as.  She also practices cryotherapy prior to each treatment. ? ?She notes that her [name] cancer mass is improving.  She can no longer feel the lump in her lower right [name]. ? ? ?Patient Active Problem List  ? Diagnosis Date Noted  ? BRCA2 gene mutation positive 01/14/2022  ? Genetic testing 01/14/2022  ? Port-A-Cath in place 11/04/2021  ? 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 ? ?has No Known Allergies. ? ?MEDICAL HISTORY: ?Past Medical History:  ?Diagnosis Date  ? Abscess   ? Cancer ([facility]) 10/16/2021  ? right [name] cancer  ? ? ?SURGICAL HISTORY: ?Past Surgical History:  ?Procedure Laterality Date  ? PORTACATH PLACEMENT Left 10/27/2021  ? Procedure: INSERTION PORT-A-CATH;  Surgeon: Stark Klein, Monica;  Location: [facility];  Service: General;  Laterality: Left;  ? ? ?SOCIAL HISTORY: ?Social History  ? ?Socioeconomic History  ? Marital status: Married  ?  Spouse name: Not on file  ? Number of children: Not on file  ? Years of education: Not on file  ? Highest education level: Not on file  ?Occupational History  ? Not on file  ?Tobacco Use  ? Smoking status: Never  ? Smokeless tobacco: Never  ?Vaping Use  ? Vaping Use: Never used  ?Substance and Sexual Activity  ? Alcohol use: No  ? Drug use: Never  ? Sexual activity: Yes  ?  Birth control/protection: Other-see comments  ?  Comment: Depo Shot  ?Other Topics Concern  ? Not  on file  ?Social History Narrative  ? Not on file  ? ?Social Determinants of Health  ? ?Financial Resource Strain: Not on file  ?Food Insecurity: Not on file  ?Transportation Needs: Not on file  ?Physical Activity: Not on file  ?Stress: Not on file  ?Social Connections: Not on file  ?Intimate Partner Violence: Not on file  ? ? ?FAMILY HISTORY: ?Family History  ?Problem Relation Age of Onset  ? [name] cancer Maternal Grandmother 27  ? ? ?Review of Systems  ?Constitutional:   Positive for fatigue. Negative for appetite change, chills, fever and unexpected weight change.  ?HENT:   Negative for hearing loss, lump/mass and trouble swallowing.   ?Eyes:  Negative for eye problems and icterus.  ?Respiratory:  Negative for chest tightness, cough and shortness of breath.   ?Cardiovascular:  Negative for chest pain, leg swelling and palpitations.  ?Gastrointestinal:  Negative for abdominal distention, abdominal pain, constipation, diarrhea, nausea and vomiting.  ?Endocrine: Negative for hot flashes.  ?Genitourinary:  Negative for difficulty urinating.   ?Musculoskeletal:  Negative for arthralgias.  ?Skin:  Negative for itching and rash.  ?Neurological:  Negative for dizziness, extremity weakness, headaches and numbness.  ?Hematological:  Negative for adenopathy. Does not bruise/bleed easily.  ?Psychiatric/Behavioral:  Negative for depression. The patient is not nervous/anxious.    ? ? ?PHYSICAL EXAMINATION ? ?ECOG PERFORMANCE STATUS: 1 - Symptomatic but completely ambulatory ? ?Vitals:  ? 01/20/22 0956  ?BP: 119/83  ?Pulse: 93  ?Resp: 18  ?Temp: (!) 97.5 ?F (36.4 ?C)  ?SpO2: 100%  ? ? ?Physical Exam ?Constitutional:   ?   General: She is not in acute distress. ?   Appearance: Normal appearance. She is not toxic-appearing.  ?HENT:  ?   Head: Normocephalic and atraumatic.  ?Eyes:  ?   General: No scleral icterus. ?Cardiovascular:  ?   Rate and Rhythm: Normal rate and regular rhythm.  ?   Pulses: Normal pulses.  ?   Heart sounds: Normal heart sounds.  ?Pulmonary:  ?   Effort: Pulmonary effort is normal.  ?   Breath sounds: Normal breath sounds.  ?Chest:  ?   Comments: Difficulty palpating right [name] mass.  Questionable thickness in right upper inner [name].  There is no palpable right axillary adenopathy. ?Abdominal:  ?   General: Abdomen is flat. Bowel sounds are normal. There is no distension.  ?   Palpations: Abdomen is soft.  ?   Tenderness: There is no abdominal tenderness.  ?Musculoskeletal:      ?   General: No swelling.  ?   Cervical back: Neck supple.  ?Lymphadenopathy:  ?   Cervical: No cervical adenopathy.  ?Skin: ?   General: Skin is warm and dry.  ?   Findings: No rash.  ?Neurological:  ?   General: No focal deficit present.  ?   Mental Status: She is alert.  ?Psychiatric:     ?   Mood and Affect: Mood normal.     ?   Behavior: Behavior normal.  ? ? ?LABORATORY DATA: ? ?CBC ?   ?Component Value Date/Time  ? WBC 3.7 (L) 01/20/2022 0939  ? WBC 3.5 (L) 11/05/2021 1053  ? RBC 3.31 (L) 01/20/2022 0939  ? HGB 10.9 (L) 01/20/2022 0939  ? HCT 30.8 (L) 01/20/2022 0939  ? PLT 213 01/20/2022 0939  ? MCV 93.1 01/20/2022 0939  ? MCH 32.9 01/20/2022 0939  ? MCHC 35.4 01/20/2022 0939  ? RDW 13.4 01/20/2022 0939  ? LYMPHSABS  1.0 01/20/2022 0939  ? MONOABS 0.3 01/20/2022 0939  ? EOSABS 0.1 01/20/2022 0939  ? BASOSABS 0.0 01/20/2022 0939  ? ? ?CMP  ?   ?Component Value Date/Time  ? NA 140 01/20/2022 0939  ? K 3.7 01/20/2022 0939  ? CL 107 01/20/2022 0939  ? CO2 27 01/20/2022 0939  ? GLUCOSE 111 (H) 01/20/2022 1597  ? BUN 11 01/20/2022 0939  ? CREATININE 0.58 01/20/2022 0939  ? CALCIUM 9.4 01/20/2022 0939  ? PROT 6.6 01/20/2022 0939  ? ALBUMIN 4.2 01/20/2022 0939  ? AST 130 (H) 01/20/2022 0939  ? ALT 66 (H) 01/20/2022 0939  ? ALKPHOS 33 (L) 01/20/2022 0939  ? BILITOT 0.5 01/20/2022 0939  ? GFRNONAA >60 01/20/2022 0939  ? ? ?ASSESSMENT and THERAPY PLAN:  ? 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, Monica receptor positive ([place]) ?10/08/2021: Large palpable lump in the right [name] with deformity of the nipple, mammogram and ultrasound revealed 5.1 cm mass central right [name], additional lesion 7 mm and 8 mm, suspicious, morphologically normal intramammary lymph node at 10:00, single right axillary lymph node (both were positive for IDC), pathology revealed grade 2 IDC ER 95%, PR 40%, Ki-67 60%, HER2 2+ by IHC, FISH negative   ?CT chest angio: No PE, no mets ?ED visit 11/05/21: Severe  back pain :CT Angio: No mets ?Bone scan 11/06/21 Bone scan: No mets ?MRI guided biopsies 10/29/21: Left [name] bx: Benign ? ?  ?Treatment plan: ?1.  Neoadjuvant chemotherapy with dose dense Adriamycin and Cy

## 2022-01-20 NOTE — Assessment & Plan Note (Signed)
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,?FISH negative?? ?CT?chest angio: No PE, no mets ?ED visit 11/05/21: Severe back pain :CT Angio: No mets ?Bone scan 11/06/21 Bone scan: No mets ?MRI guided biopsies?10/29/21: Left breast bx: Benign ? ?? ?Treatment plan: ?1.??Neoadjuvant chemotherapy with dose dense Adriamycin and Cytoxan (began 10/28/2021) followed by Taxol weekly x12 ? ?2.?Mastectomy?versus lumpectomy with targeted node dissection ?3.??Adjuvant radiation ?4.??Followed by adjuvant antiestrogen therapy (ovarian suppression) along with CDK 4 and 6 inhibitor ?5.??Genetic testing: BRCA 1 Positive ?------------------------------------------------------------------------------------------------------------- ?? ?Current Treatment: Completed 4 cycles of dose dense Adriamycin and Cytoxan started 10/28/2021, today cycle 2 Taxol  ?? ?Chemo Toxicities: ?1.  Mild fatigue ?2.  Arthralgias: I recommended 1 Aleve and 1 extra strength Tylenol to help alleviate this.  This typically happens with the first dose of Taxol and resolves following. ?3. neuropathy prevention: She is bringing an ice gloves and socks. ? ?Her cancer appears to be responding to the neoadjuvant chemotherapy.  She will continue with treatment and let us know if she has any issues. ? ?RTC weekly for chemo and every other week for provider follow-up.   ?

## 2022-01-20 NOTE — Progress Notes (Signed)
Per Dr. Lindi Adie, [facility] to treat patient today with AST 130 and ALT 66. ?

## 2022-01-20 NOTE — Patient Instructions (Signed)
[facility] MEDICAL ONCOLOGY   Discharge Instructions: Thank you for choosing [facility] to provide your oncology and hematology care.   If you have a lab appointment with the Cancer Center, please go directly to the Cancer Center and check in at the registration area.   Wear comfortable clothing and clothing appropriate for easy access to any Portacath or PICC line.   We strive to give you quality time with your provider. You may need to reschedule your appointment if you arrive late (15 or more minutes).  Arriving late affects you and other patients whose appointments are after yours.  Also, if you miss three or more appointments without notifying the office, you may be dismissed from the clinic at the provider's discretion.      For prescription refill requests, have your pharmacy contact our office and allow 72 hours for refills to be completed.    Today you received the following chemotherapy and/or immunotherapy agents: paclitaxel.      To help prevent nausea and vomiting after your treatment, we encourage you to take your nausea medication as directed.  BELOW ARE SYMPTOMS THAT SHOULD BE REPORTED IMMEDIATELY: *FEVER GREATER THAN 100.4 F (38 C) OR HIGHER *CHILLS OR SWEATING *NAUSEA AND VOMITING THAT IS NOT CONTROLLED WITH YOUR NAUSEA MEDICATION *UNUSUAL SHORTNESS OF BREATH *UNUSUAL BRUISING OR BLEEDING *URINARY PROBLEMS (pain or burning when urinating, or frequent urination) *BOWEL PROBLEMS (unusual diarrhea, constipation, pain near the anus) TENDERNESS IN MOUTH AND THROAT WITH OR WITHOUT PRESENCE OF ULCERS (sore throat, sores in mouth, or a toothache) UNUSUAL RASH, SWELLING OR PAIN  UNUSUAL VAGINAL DISCHARGE OR ITCHING   Items with * indicate a potential emergency and should be followed up as soon as possible or go to the Emergency Department if any problems should occur.  Please show the CHEMOTHERAPY ALERT CARD or IMMUNOTHERAPY ALERT CARD at check-in  to the Emergency Department and triage nurse.  Should you have questions after your visit or need to cancel or reschedule your appointment, please contact [facility] MEDICAL ONCOLOG[facility]  Dept: [phone]  and follow the prompts.  Office hours are 8:00 a.m. to 4:30 p.m. Monday - Friday. Please note that voicemails left after 4:00 p.m. may not be returned until the following business day.  We are closed weekends and major holidays. You have access to a nurse at all times for urgent questions. Please call the main number to the clinic Dept: [phone] and follow the prompts.   For any non-urgent questions, you may also contact your provider using MyChart. We now offer e-Visits for anyone 18 and older to request care online for non-urgent symptoms. For details visit [email or web address].   Also download the MyChart app! Go to the app store, search "MyChart", open the app, select , and log in with your MyChart username and password.  Due to Covid, a mask is required upon entering the hospital/clinic. If you do not have a mask, one will be given to you upon arrival. For doctor visits, patients may have 1 support person aged 18 or older with them. For treatment visits, patients cannot have anyone with them due to current Covid guidelines and our immunocompromised population.   

## 2022-01-22 ENCOUNTER — Telehealth: Payer: Self-pay | Admitting: Hematology and Oncology

## 2022-01-22 ENCOUNTER — Encounter: Payer: Self-pay | Admitting: *Deleted

## 2022-01-22 ENCOUNTER — Telehealth: Payer: Self-pay | Admitting: Genetic Counselor

## 2022-01-22 NOTE — Telephone Encounter (Signed)
Scheduled appointment per 4/5 los. Patient is aware of her upcoming appointment. ?

## 2022-01-22 NOTE — Telephone Encounter (Signed)
I attempted to contact [name] to discuss her genetic testing results (77 genes). I left a voicemail requesting she call me back at [phone]. ? ?Monica Passy, MS, LCGC ?Genetic Counselor ?[email or web address] ?(P) [phone] ? ?

## 2022-01-27 ENCOUNTER — Inpatient Hospital Stay: Payer: Medicaid Other

## 2022-01-27 ENCOUNTER — Other Ambulatory Visit: Payer: Self-pay

## 2022-01-27 ENCOUNTER — Encounter: Payer: Self-pay | Admitting: *Deleted

## 2022-01-27 VITALS — BP 114/84 | HR 85 | Temp 98.5°F | Resp 15 | Wt 139.8 lb

## 2022-01-27 DIAGNOSIS — Z95828 Presence of other vascular implants and grafts: Secondary | ICD-10-CM

## 2022-01-27 DIAGNOSIS — Z5111 Encounter for antineoplastic chemotherapy: Secondary | ICD-10-CM | POA: Diagnosis not present

## 2022-01-27 DIAGNOSIS — Z170 Estrogen receptor positive status [ER+]: Secondary | ICD-10-CM

## 2022-01-27 LAB — CMP (CANCER CENTER ONLY)
ALT: 37 U/L (ref 0–44)
AST: 28 U/L (ref 15–41)
Albumin: 4.3 g/dL (ref 3.5–5.0)
Alkaline Phosphatase: 34 U/L — ABNORMAL LOW (ref 38–126)
Anion gap: 6 (ref 5–15)
BUN: 13 mg/dL (ref 6–20)
CO2: 26 mmol/L (ref 22–32)
Calcium: 9.8 mg/dL (ref 8.9–10.3)
Chloride: 106 mmol/L (ref 98–111)
Creatinine: 0.63 mg/dL (ref 0.44–1.00)
GFR, Estimated: >60 mL/min (ref >60)
Glucose, Bld: 100 mg/dL — ABNORMAL HIGH (ref 70–99)
Potassium: 3.8 mmol/L (ref 3.5–5.1)
Sodium: 138 mmol/L (ref 135–145)
Total Bilirubin: 0.5 mg/dL (ref 0.3–1.2)
Total Protein: 6.9 g/dL (ref 6.5–8.1)

## 2022-01-27 LAB — CBC WITH DIFFERENTIAL (CANCER CENTER ONLY)
Abs Immature Granulocytes: 0.03 10*3/uL (ref 0.00–0.07)
Basophils Absolute: 0 10*3/uL (ref 0.0–0.1)
Basophils Relative: 1 %
Eosinophils Absolute: 0.2 10*3/uL (ref 0.0–0.5)
Eosinophils Relative: 6 %
HCT: 31.2 % — ABNORMAL LOW (ref 36.0–46.0)
Hemoglobin: 11.2 g/dL — ABNORMAL LOW (ref 12.0–15.0)
Immature Granulocytes: 1 %
Lymphocytes Relative: 25 %
Lymphs Abs: 0.8 10*3/uL (ref 0.7–4.0)
MCH: 33.3 pg (ref 26.0–34.0)
MCHC: 35.9 g/dL (ref 30.0–36.0)
MCV: 92.9 fL (ref 80.0–100.0)
Monocytes Absolute: 0.3 10*3/uL (ref 0.1–1.0)
Monocytes Relative: 11 %
Neutro Abs: 1.8 10*3/uL (ref 1.7–7.7)
Neutrophils Relative %: 56 %
Platelet Count: 277 10*3/uL (ref 150–400)
RBC: 3.36 MIL/uL — ABNORMAL LOW (ref 3.87–5.11)
RDW: 13.5 % (ref 11.5–15.5)
WBC Count: 3.2 10*3/uL — ABNORMAL LOW (ref 4.0–10.5)
nRBC: 0 % (ref 0.0–0.2)

## 2022-01-27 LAB — PREGNANCY, URINE: Preg Test, Ur: NEGATIVE

## 2022-01-27 MED ORDER — HEPARIN SOD (PORK) LOCK FLUSH 100 UNIT/ML IV SOLN
500.0000 [IU] | Freq: Once | INTRAVENOUS | Status: AC | PRN
  Administered 2022-01-27: 500 [IU]

## 2022-01-27 MED ORDER — SODIUM CHLORIDE 0.9 % IV SOLN
80.0000 mg/m2 | Freq: Once | INTRAVENOUS | Status: AC
  Administered 2022-01-27: 138 mg via INTRAVENOUS
  Filled 2022-01-27: qty 23

## 2022-01-27 MED ORDER — FAMOTIDINE IN NACL 20-0.9 MG/50ML-% IV SOLN
20.0000 mg | Freq: Once | INTRAVENOUS | Status: AC
  Administered 2022-01-27: 20 mg via INTRAVENOUS
  Filled 2022-01-27: qty 50

## 2022-01-27 MED ORDER — SODIUM CHLORIDE 0.9% FLUSH
10.0000 mL | INTRAVENOUS | Status: DC | PRN
  Administered 2022-01-27: 10 mL

## 2022-01-27 MED ORDER — SODIUM CHLORIDE 0.9 % IV SOLN: Freq: Once | INTRAVENOUS | Status: AC

## 2022-01-27 MED ORDER — SODIUM CHLORIDE 0.9 % IV SOLN
10.0000 mg | Freq: Once | INTRAVENOUS | Status: AC
  Administered 2022-01-27: 10 mg via INTRAVENOUS
  Filled 2022-01-27: qty 10

## 2022-01-27 MED ORDER — DIPHENHYDRAMINE HCL 50 MG/ML IJ SOLN
50.0000 mg | Freq: Once | INTRAMUSCULAR | Status: AC
  Administered 2022-01-27: 50 mg via INTRAVENOUS
  Filled 2022-01-27: qty 1

## 2022-01-27 MED ORDER — SODIUM CHLORIDE 0.9% FLUSH
10.0000 mL | Freq: Once | INTRAVENOUS | Status: AC
  Administered 2022-01-27: 10 mL

## 2022-01-27 NOTE — Patient Instructions (Signed)
[facility]  Discharge Instructions: ?Thank you for choosing [facility] to provide your oncology and hematology care.  ? ?If you have a lab appointment with the [facility], please go directly to the [facility] and check in at the registration area. ?  ?Wear comfortable clothing and clothing appropriate for easy access to any Portacath or PICC line.  ? ?We strive to give you quality time with your provider. You may need to reschedule your appointment if you arrive late (15 or more minutes).  Arriving late affects you and other patients whose appointments are after yours.  Also, if you miss three or more appointments without notifying the office, you may be dismissed from the clinic at the provider?s discretion.    ?  ?For prescription refill requests, have your pharmacy contact our office and allow 72 hours for refills to be completed.   ? ?Today you received the following chemotherapy and/or immunotherapy agents: Paclitaxel    ?  ?To help prevent nausea and vomiting after your treatment, we encourage you to take your nausea medication as directed. ? ?BELOW ARE SYMPTOMS THAT SHOULD BE REPORTED IMMEDIATELY: ?*FEVER GREATER THAN 100.4 F (38 ?C) OR HIGHER ?*CHILLS OR SWEATING ?*NAUSEA AND VOMITING THAT IS NOT CONTROLLED WITH YOUR NAUSEA MEDICATION ?*UNUSUAL SHORTNESS OF BREATH ?*UNUSUAL BRUISING OR BLEEDING ?*URINARY PROBLEMS (pain or burning when urinating, or frequent urination) ?*BOWEL PROBLEMS (unusual diarrhea, constipation, pain near the anus) ?TENDERNESS IN MOUTH AND THROAT WITH OR WITHOUT PRESENCE OF ULCERS (sore throat, sores in mouth, or a toothache) ?UNUSUAL RASH, SWELLING OR PAIN  ?UNUSUAL VAGINAL DISCHARGE OR ITCHING  ? ?Items with * indicate a potential emergency and should be followed up as soon as possible or go to the Emergency Department if any problems should occur. ? ?Please show the CHEMOTHERAPY ALERT CARD or IMMUNOTHERAPY ALERT CARD at check-in to  the Emergency Department and triage nurse. ? ?Should you have questions after your visit or need to cancel or reschedule your appointment, please contact [facility]  Dept: [phone]  and follow the prompts.  Office hours are 8:00 a.m. to 4:30 p.m. Monday - Friday. Please note that voicemails left after 4:00 p.m. may not be returned until the following business day.  We are closed weekends and major holidays. You have access to a nurse at all times for urgent questions. Please call the main number to the clinic Dept: [phone] and follow the prompts. ? ? ?For any non-urgent questions, you may also contact your provider using MyChart. We now offer e-Visits for anyone 40 and older to request care online for non-urgent symptoms. For details visit [email or web address]. ?  ?Also download the MyChart app! Go to the app store, search "MyChart", open the app, select [facility], and log in with your MyChart username and password. ? ?Due to Covid, a mask is required upon entering the hospital/clinic. If you do not have a mask, one will be given to you upon arrival. For doctor visits, patients may have 1 support person aged 93 or older with them. For treatment visits, patients cannot have anyone with them due to current Covid guidelines and our immunocompromised population.  ? ?

## 2022-02-01 ENCOUNTER — Telehealth: Payer: Self-pay | Admitting: Genetic Counselor

## 2022-02-01 ENCOUNTER — Encounter: Payer: Self-pay | Admitting: Genetic Counselor

## 2022-02-01 NOTE — Telephone Encounter (Signed)
I attempted to contact Ms. Pion to discuss her final genetic test results. I left a voicemail requesting she call me back at [phone]. ? ?Lucille Passy, MS, LCGC ?Genetic Counselor ?[email or web address] ?(P) [phone] ? ?

## 2022-02-03 ENCOUNTER — Other Ambulatory Visit: Payer: Self-pay

## 2022-02-03 ENCOUNTER — Inpatient Hospital Stay: Payer: Medicaid Other

## 2022-02-03 ENCOUNTER — Inpatient Hospital Stay (HOSPITAL_BASED_OUTPATIENT_CLINIC_OR_DEPARTMENT_OTHER): Payer: Medicaid Other | Admitting: Physician Assistant

## 2022-02-03 VITALS — BP 115/77 | HR 97 | Temp 97.7°F | Resp 20 | Wt 141.6 lb

## 2022-02-03 DIAGNOSIS — Z170 Estrogen receptor positive status [ER+]: Secondary | ICD-10-CM

## 2022-02-03 DIAGNOSIS — Z5111 Encounter for antineoplastic chemotherapy: Secondary | ICD-10-CM | POA: Diagnosis not present

## 2022-02-03 DIAGNOSIS — C50411 Malignant neoplasm of upper-outer quadrant of right female breast: Secondary | ICD-10-CM

## 2022-02-03 DIAGNOSIS — Z95828 Presence of other vascular implants and grafts: Secondary | ICD-10-CM

## 2022-02-03 LAB — CMP (CANCER CENTER ONLY)
ALT: 17 U/L (ref 0–44)
AST: 25 U/L (ref 15–41)
Albumin: 4.2 g/dL (ref 3.5–5.0)
Alkaline Phosphatase: 38 U/L (ref 38–126)
Anion gap: 7 (ref 5–15)
BUN: 13 mg/dL (ref 6–20)
CO2: 26 mmol/L (ref 22–32)
Calcium: 9.1 mg/dL (ref 8.9–10.3)
Chloride: 106 mmol/L (ref 98–111)
Creatinine: 0.67 mg/dL (ref 0.44–1.00)
GFR, Estimated: >60 mL/min (ref >60)
Glucose, Bld: 114 mg/dL — ABNORMAL HIGH (ref 70–99)
Potassium: 3.6 mmol/L (ref 3.5–5.1)
Sodium: 139 mmol/L (ref 135–145)
Total Bilirubin: 0.8 mg/dL (ref 0.3–1.2)
Total Protein: 6.5 g/dL (ref 6.5–8.1)

## 2022-02-03 LAB — CBC WITH DIFFERENTIAL (CANCER CENTER ONLY)
Abs Immature Granulocytes: 0.04 10*3/uL (ref 0.00–0.07)
Basophils Absolute: 0 10*3/uL (ref 0.0–0.1)
Basophils Relative: 1 %
Eosinophils Absolute: 0.1 10*3/uL (ref 0.0–0.5)
Eosinophils Relative: 3 %
HCT: 30.5 % — ABNORMAL LOW (ref 36.0–46.0)
Hemoglobin: 10.8 g/dL — ABNORMAL LOW (ref 12.0–15.0)
Immature Granulocytes: 1 %
Lymphocytes Relative: 21 %
Lymphs Abs: 1 10*3/uL (ref 0.7–4.0)
MCH: 33.4 pg (ref 26.0–34.0)
MCHC: 35.4 g/dL (ref 30.0–36.0)
MCV: 94.4 fL (ref 80.0–100.0)
Monocytes Absolute: 0.3 10*3/uL (ref 0.1–1.0)
Monocytes Relative: 7 %
Neutro Abs: 3.1 10*3/uL (ref 1.7–7.7)
Neutrophils Relative %: 67 %
Platelet Count: 300 10*3/uL (ref 150–400)
RBC: 3.23 MIL/uL — ABNORMAL LOW (ref 3.87–5.11)
RDW: 13.5 % (ref 11.5–15.5)
WBC Count: 4.6 10*3/uL (ref 4.0–10.5)
nRBC: 0 % (ref 0.0–0.2)

## 2022-02-03 LAB — PREGNANCY, URINE: Preg Test, Ur: NEGATIVE

## 2022-02-03 MED ORDER — SODIUM CHLORIDE 0.9 % IV SOLN: Freq: Once | INTRAVENOUS | Status: AC

## 2022-02-03 MED ORDER — HEPARIN SOD (PORK) LOCK FLUSH 100 UNIT/ML IV SOLN
500.0000 [IU] | Freq: Once | INTRAVENOUS | Status: AC | PRN
  Administered 2022-02-03: 500 [IU]

## 2022-02-03 MED ORDER — FAMOTIDINE IN NACL 20-0.9 MG/50ML-% IV SOLN
20.0000 mg | Freq: Once | INTRAVENOUS | Status: AC
  Administered 2022-02-03: 20 mg via INTRAVENOUS

## 2022-02-03 MED ORDER — SODIUM CHLORIDE 0.9% FLUSH
10.0000 mL | Freq: Once | INTRAVENOUS | Status: AC
  Administered 2022-02-03: 10 mL

## 2022-02-03 MED ORDER — SODIUM CHLORIDE 0.9 % IV SOLN
80.0000 mg/m2 | Freq: Once | INTRAVENOUS | Status: AC
  Administered 2022-02-03: 138 mg via INTRAVENOUS
  Filled 2022-02-03: qty 23

## 2022-02-03 MED ORDER — SODIUM CHLORIDE 0.9 % IV SOLN
10.0000 mg | Freq: Once | INTRAVENOUS | Status: AC
  Administered 2022-02-03: 10 mg via INTRAVENOUS
  Filled 2022-02-03: qty 10

## 2022-02-03 MED ORDER — SODIUM CHLORIDE 0.9% FLUSH
10.0000 mL | INTRAVENOUS | Status: DC | PRN
  Administered 2022-02-03: 10 mL

## 2022-02-03 MED ORDER — DIPHENHYDRAMINE HCL 50 MG/ML IJ SOLN
50.0000 mg | Freq: Once | INTRAMUSCULAR | Status: AC
  Administered 2022-02-03: 50 mg via INTRAVENOUS

## 2022-02-03 NOTE — Progress Notes (Signed)
[facility] Follow up: ?  ? ?Default, Provider, MD ?No address on file ? ? ?DIAGNOSIS:  Cancer Staging  ?Malignant neoplasm of upper-outer quadrant of right breast in female, estrogen receptor positive ([place]) ?Staging form: Breast, AJCC 8th Edition ?- Clinical stage from 10/16/2021: Stage IIA (cT3, cN1, cM0, G2, ER+, PR+, HER2: Equivocal) - Signed by [name], MD on 10/16/2021 ?Histologic grading system: 3 grade system ? ? ?SUMMARY OF ONCOLOGIC HISTORY: ?Oncology History  ?Malignant neoplasm of upper-outer quadrant of right breast in female, estrogen receptor positive ([facility])  ?10/16/2021 Initial Diagnosis  ? Malignant neoplasm of upper-outer quadrant of right breast in female, estrogen receptor positive ([place]) ? ?  ?10/16/2021 Cancer Staging  ? Staging form: Breast, AJCC 8th Edition ?- Clinical stage from 10/16/2021: Stage IIA (cT3, cN1, cM0, G2, ER+, PR+, HER2: Equivocal) - Signed by [name], MD on 10/16/2021 ?Histologic grading system: 3 grade system ? ?  ?10/28/2021 -  Chemotherapy  ? Patient is on Treatment Plan : BREAST ADJUVANT DOSE DENSE AC q14d / PACLitaxel q7d  ? ?  ?  ? Genetic Testing  ? [facility] CancerNext-Expanded identified a single pathogenic variant in the BRCA2 gene ([id number]*). Report date is 01/21/2022. ? ?The CancerNext-Expanded gene panel offered by [facility] and includes sequencing, rearrangement, and RNA analysis for the following 77 genes: AIP, ALK, APC, ATM, AXIN2, BAP1, BARD1, BLM, BMPR1A, BRCA1, BRCA2, BRIP1, CDC73, CDH1, CDK4, CDKN1B, CDKN2A, CHEK2, CTNNA1, DICER1, FANCC, FH, FLCN, GALNT12, KIF1B, LZTR1, MAX, MEN1, MET, MLH1, MSH2, MSH3, MSH6, MUTYH, NBN, NF1, NF2, NTHL1, PALB2, PHOX2B, PMS2, POT1, PRKAR1A, PTCH1, PTEN, RAD51C, RAD51D, RB1, RECQL, RET, SDHA, SDHAF2, SDHB, SDHC, SDHD, SMAD4, SMARCA4, SMARCB1, SMARCE1, STK11, SUFU, TMEM127, TP53, TSC1, TSC2, VHL and XRCC2 (sequencing and deletion/duplication); EGFR, EGLN1, HOXB13, KIT, MITF, PDGFRA, POLD1, and  POLE (sequencing only); EPCAM and GREM1 (deletion/duplication only).  ?  ? ? ?CURRENT THERAPY: Neoadjuvant chemotherapy with weekly taxol. ? ?INTERVAL HISTORY: ?Monica [name] 26 y.o. female returns for follow-up for breast cancer. She was last seen by Wilber Bihari NP on 01/20/2022. She is due for another weekly taxol treatment.  ? ?Ms. Bianchini reports she continues to tolerate chemotherapy without any significant limitations.  Her energy and appetite levels are stable.  She continues to complete all her daily activities on her own.  She denies any nausea, vomiting or abdominal pain.  Her bowel habits are unchanged without any recurrent episodes of diarrhea or constipation.  Patient denies easy bruising or signs of active bleeding.  She continues to struggle with insomnia on certain days.  She has taken the prescribed Ativan without any improvement.  She denies fevers, chills, night sweats, shortness of breath, chest pain, cough, peripheral edema or neuropathy.  She has no other complaints. ? ? ? ?Patient Active Problem List  ? Diagnosis Date Noted  ? BRCA2 gene mutation positive 01/14/2022  ? Genetic testing 01/14/2022  ? Port-A-Cath in place 11/04/2021  ? Malignant neoplasm of upper-outer quadrant of right breast in female, estrogen receptor positive ([place]) 10/16/2021  ? ? ?has No Known Allergies. ? ?MEDICAL HISTORY: ?Past Medical History:  ?Diagnosis Date  ? Abscess   ? Cancer ([facility]) 10/16/2021  ? right breast cancer  ? ? ?SURGICAL HISTORY: ?Past Surgical History:  ?Procedure Laterality Date  ? PORTACATH PLACEMENT Left 10/27/2021  ? Procedure: INSERTION PORT-A-CATH;  Surgeon: Stark Klein, MD;  Location: [facility];  Service: General;  Laterality: Left;  ? ? ?SOCIAL HISTORY: ?Social History  ? ?Socioeconomic History  ?  Marital status: Married  ?  Spouse name: Not on file  ? Number of children: Not on file  ? Years of education: Not on file  ? Highest education level: Not on file  ?Occupational History  ? Not on file   ?Tobacco Use  ? Smoking status: Never  ? Smokeless tobacco: Never  ?Vaping Use  ? Vaping Use: Never used  ?Substance and Sexual Activity  ? Alcohol use: No  ? Drug use: Never  ? Sexual activity: Yes  ?  Birth control/protection: Other-see comments  ?  Comment: Depo Shot  ?Other Topics Concern  ? Not on file  ?Social History Narrative  ? Not on file  ? ?Social Determinants of Health  ? ?Financial Resource Strain: Not on file  ?Food Insecurity: Not on file  ?Transportation Needs: Not on file  ?Physical Activity: Not on file  ?Stress: Not on file  ?Social Connections: Not on file  ?Intimate Partner Violence: Not on file  ? ? ?FAMILY HISTORY: ?Family History  ?Problem Relation Age of Onset  ? Breast cancer Maternal Grandmother 56  ? ? ?Review of Systems  ?Constitutional:  Negative for appetite change, chills, fever and unexpected weight change.  ?HENT:   Negative for hearing loss, lump/mass and trouble swallowing.   ?Eyes:  Negative for eye problems and icterus.  ?Respiratory:  Negative for chest tightness, cough and shortness of breath.   ?Cardiovascular:  Negative for chest pain, leg swelling and palpitations.  ?Gastrointestinal:  Negative for abdominal distention, abdominal pain, constipation, diarrhea, nausea and vomiting.  ?Endocrine: Negative for hot flashes.  ?Genitourinary:  Negative for difficulty urinating.   ?Musculoskeletal:  Negative for arthralgias.  ?Skin:  Negative for itching and rash.  ?Neurological:  Negative for dizziness, extremity weakness, headaches and numbness.  ?Hematological:  Negative for adenopathy. Does not bruise/bleed easily.  ?Psychiatric/Behavioral:  Positive for sleep disturbance. Negative for depression. The patient is not nervous/anxious.    ? ? ?PHYSICAL EXAMINATION ? ?ECOG PERFORMANCE STATUS: 1 - Symptomatic but completely ambulatory ? ?Vitals:  ? 02/03/22 1040  ?BP: 115/77  ?Pulse: 97  ?Resp: 20  ?Temp: 97.7 ?F (36.5 ?C)  ?SpO2: 100%  ? ? ?Physical Exam ?Constitutional:   ?    General: She is not in acute distress. ?   Appearance: Normal appearance. She is not toxic-appearing.  ?HENT:  ?   Head: Normocephalic and atraumatic.  ?Eyes:  ?   General: No scleral icterus. ?Cardiovascular:  ?   Rate and Rhythm: Normal rate and regular rhythm.  ?   Pulses: Normal pulses.  ?   Heart sounds: Normal heart sounds.  ?Pulmonary:  ?   Effort: Pulmonary effort is normal.  ?   Breath sounds: Normal breath sounds.  ?Abdominal:  ?   General: Abdomen is flat. Bowel sounds are normal. There is no distension.  ?   Palpations: Abdomen is soft.  ?   Tenderness: There is no abdominal tenderness.  ?Musculoskeletal:     ?   General: No swelling.  ?   Cervical back: Neck supple.  ?Lymphadenopathy:  ?   Cervical: No cervical adenopathy.  ?Skin: ?   General: Skin is warm and dry.  ?   Findings: No rash.  ?Neurological:  ?   General: No focal deficit present.  ?   Mental Status: She is alert.  ?Psychiatric:     ?   Mood and Affect: Mood normal.     ?   Behavior: Behavior normal.  ? ? ?  LABORATORY DATA: ? ?CBC ?   ?Component Value Date/Time  ? WBC 4.6 02/03/2022 1025  ? WBC 3.5 (L) 11/05/2021 1053  ? RBC 3.23 (L) 02/03/2022 1025  ? HGB 10.8 (L) 02/03/2022 1025  ? HCT 30.5 (L) 02/03/2022 1025  ? PLT 300 02/03/2022 1025  ? MCV 94.4 02/03/2022 1025  ? MCH 33.4 02/03/2022 1025  ? MCHC 35.4 02/03/2022 1025  ? RDW 13.5 02/03/2022 1025  ? LYMPHSABS 1.0 02/03/2022 1025  ? MONOABS 0.3 02/03/2022 1025  ? EOSABS 0.1 02/03/2022 1025  ? BASOSABS 0.0 02/03/2022 1025  ? ? ?CMP  ?   ?Component Value Date/Time  ? NA 139 02/03/2022 1025  ? K 3.6 02/03/2022 1025  ? CL 106 02/03/2022 1025  ? CO2 26 02/03/2022 1025  ? GLUCOSE 114 (H) 02/03/2022 1025  ? BUN 13 02/03/2022 1025  ? CREATININE 0.67 02/03/2022 1025  ? CALCIUM 9.1 02/03/2022 1025  ? PROT 6.5 02/03/2022 1025  ? ALBUMIN 4.2 02/03/2022 1025  ? AST 25 02/03/2022 1025  ? ALT 17 02/03/2022 1025  ? ALKPHOS 38 02/03/2022 1025  ? BILITOT 0.8 02/03/2022 1025  ? GFRNONAA >60 02/03/2022 1025   ? ? ?ASSESSMENT and THERAPY PLAN:  ?Malignant neoplasm of upper-outer quadrant of right breast in female, estrogen receptor positive ([place]) ?10/08/2021: Large palpable lump in the right breast with deformity of

## 2022-02-03 NOTE — Patient Instructions (Signed)
[facility]  Discharge Instructions: ?Thank you for choosing [facility] to provide your oncology and hematology care.  ? ?If you have a lab appointment with the [facility], please go directly to the [facility] and check in at the registration area. ?  ?Wear comfortable clothing and clothing appropriate for easy access to any Portacath or PICC line.  ? ?We strive to give you quality time with your provider. You may need to reschedule your appointment if you arrive late (15 or more minutes).  Arriving late affects you and other patients whose appointments are after yours.  Also, if you miss three or more appointments without notifying the office, you may be dismissed from the clinic at the provider?s discretion.    ?  ?For prescription refill requests, have your pharmacy contact our office and allow 72 hours for refills to be completed.   ? ?Today you received the following chemotherapy and/or immunotherapy agents: Taxol   ?  ?To help prevent nausea and vomiting after your treatment, we encourage you to take your nausea medication as directed. ? ?BELOW ARE SYMPTOMS THAT SHOULD BE REPORTED IMMEDIATELY: ?*FEVER GREATER THAN 100.4 F (38 ?C) OR HIGHER ?*CHILLS OR SWEATING ?*NAUSEA AND VOMITING THAT IS NOT CONTROLLED WITH YOUR NAUSEA MEDICATION ?*UNUSUAL SHORTNESS OF BREATH ?*UNUSUAL BRUISING OR BLEEDING ?*URINARY PROBLEMS (pain or burning when urinating, or frequent urination) ?*BOWEL PROBLEMS (unusual diarrhea, constipation, pain near the anus) ?TENDERNESS IN MOUTH AND THROAT WITH OR WITHOUT PRESENCE OF ULCERS (sore throat, sores in mouth, or a toothache) ?UNUSUAL RASH, SWELLING OR PAIN  ?UNUSUAL VAGINAL DISCHARGE OR ITCHING  ? ?Items with * indicate a potential emergency and should be followed up as soon as possible or go to the Emergency Department if any problems should occur. ? ?Please show the CHEMOTHERAPY ALERT CARD or IMMUNOTHERAPY ALERT CARD at check-in to the  Emergency Department and triage nurse. ? ?Should you have questions after your visit or need to cancel or reschedule your appointment, please contact [facility]  Dept: [phone]  and follow the prompts.  Office hours are 8:00 a.m. to 4:30 p.m. Monday - Friday. Please note that voicemails left after 4:00 p.m. may not be returned until the following business day.  We are closed weekends and major holidays. You have access to a nurse at all times for urgent questions. Please call the main number to the clinic Dept: [phone] and follow the prompts. ? ? ?For any non-urgent questions, you may also contact your provider using MyChart. We now offer e-Visits for anyone 54 and older to request care online for non-urgent symptoms. For details visit [email or web address]. ?  ?Also download the MyChart app! Go to the app store, search "MyChart", open the app, select [facility], and log in with your MyChart username and password. ? ?Due to Covid, a mask is required upon entering the hospital/clinic. If you do not have a mask, one will be given to you upon arrival. For doctor visits, patients may have 1 support person aged 43 or older with them. For treatment visits, patients cannot have anyone with them due to current Covid guidelines and our immunocompromised population.  ? ?

## 2022-02-04 NOTE — Progress Notes (Signed)
? ?Patient Care Team: ?Default, Provider, MD as PCP - General ?[name], RN as Oncology Nurse Navigator ?[name], RN as Oncology Nurse Navigator ? ?DIAGNOSIS:  ?Encounter Diagnosis  ?Name Primary?  ? Malignant neoplasm of upper-outer quadrant of right breast in female, estrogen receptor positive ([facility])   ? ? ?SUMMARY OF ONCOLOGIC HISTORY: ?Oncology History  ?Malignant neoplasm of upper-outer quadrant of right breast in female, estrogen receptor positive ([facility] [facility])  ?10/16/2021 Initial Diagnosis  ? Malignant neoplasm of upper-outer quadrant of right breast in female, estrogen receptor positive ([facility]) ? ?  ?10/16/2021 Cancer Staging  ? Staging form: Breast, AJCC 8th Edition ?- Clinical stage from 10/16/2021: Stage IIA (cT3, cN1, cM0, G2, ER+, PR+, HER2: Equivocal) - Signed by [name], MD on 10/16/2021 ?Histologic grading system: 3 grade system ? ?  ?10/28/2021 -  Chemotherapy  ? Patient is on Treatment Plan : BREAST ADJUVANT DOSE DENSE AC q14d / PACLitaxel q7d  ? ?  ?  ? Genetic Testing  ? [facility] CancerNext-Expanded identified a single pathogenic variant in the BRCA2 gene ([id number]*). Report date is 01/21/2022. ? ?The CancerNext-Expanded gene panel offered by [facility] and includes sequencing, rearrangement, and RNA analysis for the following 77 genes: AIP, ALK, APC, ATM, AXIN2, BAP1, BARD1, BLM, BMPR1A, BRCA1, BRCA2, BRIP1, CDC73, CDH1, CDK4, CDKN1B, CDKN2A, CHEK2, CTNNA1, DICER1, FANCC, FH, FLCN, GALNT12, KIF1B, LZTR1, MAX, MEN1, MET, MLH1, MSH2, MSH3, MSH6, MUTYH, NBN, NF1, NF2, NTHL1, PALB2, PHOX2B, PMS2, POT1, PRKAR1A, PTCH1, PTEN, RAD51C, RAD51D, RB1, RECQL, RET, SDHA, SDHAF2, SDHB, SDHC, SDHD, SMAD4, SMARCA4, SMARCB1, SMARCE1, STK11, SUFU, TMEM127, TP53, TSC1, TSC2, VHL and XRCC2 (sequencing and deletion/duplication); EGFR, EGLN1, HOXB13, KIT, MITF, PDGFRA, POLD1, and POLE (sequencing only); EPCAM and GREM1 (deletion/duplication only).  ?  ? ? ?CHIEF COMPLIANT: right breast cancer cycle 6 Taxol   ? ?INTERVAL HISTORY: Monica [name] is a 26 y.o. with above-mentioned history of ER/PR positive right breast cancer, currently on chemotherapy with cycle 6 Taxol. She presents to the clinic today for treatment.   ?She has been tolerating chemotherapy extremely well without any problems or concerns.  Denies any nausea or vomiting.  Denies peripheral neuropathy.  She has some mild muscle aches periodically.  She had the symptoms even before chemo. ? ?ALLERGIES:  has No Known Allergies. ? ?MEDICATIONS:  ?Current Outpatient Medications  ?Medication Sig Dispense Refill  ? dexamethasone (DECADRON) 4 MG tablet Take 1 tablet (4 mg total) by mouth daily. Take 1 tablet day after chemo and 1 tablet 2 days after chemo with food 8 tablet 0  ? gabapentin (NEURONTIN) 100 MG capsule Take 100 mg by mouth daily.    ? lidocaine-prilocaine (EMLA) cream Apply to affected area once 30 g 3  ? LORazepam (ATIVAN) 0.5 MG tablet Take 1 tablet (0.5 mg total) by mouth at bedtime as needed for sleep. 30 tablet 0  ? naproxen (NAPROSYN) 500 MG tablet Take 500 mg by mouth 2 (two) times daily.    ? ondansetron (ZOFRAN) 8 MG tablet Take 1 tablet (8 mg total) by mouth 2 (two) times daily as needed. Start on the third day after chemotherapy. 30 tablet 1  ? prochlorperazine (COMPAZINE) 10 MG tablet Take 1 tablet (10 mg total) by mouth every 6 (six) hours as needed (Nausea or vomiting). 30 tablet 1  ? VENTOLIN HFA 108 (90 Base) MCG/ACT inhaler     ? ?No current facility-administered medications for this visit.  ? ? ?PHYSICAL EXAMINATION: ?ECOG PERFORMANCE STATUS: 1 - Symptomatic but completely  ambulatory ? ?Vitals:  ? 02/18/22 0840  ?BP: 123/78  ?Pulse: 90  ?Resp: 18  ?Temp: (!) 97.5 ?F (36.4 ?C)  ?SpO2: 100%  ? ?Filed Weights  ? 02/18/22 0840  ?Weight: 141 lb (64 kg)  ? ?  ? ?LABORATORY DATA:  ?I have reviewed the data as listed ? ?  Latest Ref Rng & Units 02/10/2022  ?  8:53 AM 02/03/2022  ? 10:25 AM 01/27/2022  ?  8:26 AM  ?CMP  ?Glucose 70 - 99 mg/dL  95   114   100    ?BUN 6 - 20 mg/dL 10   13   13     ?Creatinine 0.44 - 1.00 mg/dL 0.63   0.67   0.63    ?Sodium 135 - 145 mmol/L 140   139   138    ?Potassium 3.5 - 5.1 mmol/L 4.0   3.6   3.8    ?Chloride 98 - 111 mmol/L 106   106   106    ?CO2 22 - 32 mmol/L 26   26   26     ?Calcium 8.9 - 10.3 mg/dL 9.1   9.1   9.8    ?Total Protein 6.5 - 8.1 g/dL 6.5   6.5   6.9    ?Total Bilirubin 0.3 - 1.2 mg/dL 0.4   0.8   0.5    ?Alkaline Phos 38 - 126 U/L 36   38   34    ?AST 15 - 41 U/L 41   25   28    ?ALT 0 - 44 U/L 33   17   37    ? ? ?Lab Results  ?Component Value Date  ? WBC 3.8 (L) 02/18/2022  ? HGB 11.5 (L) 02/18/2022  ? HCT 32.1 (L) 02/18/2022  ? MCV 95.8 02/18/2022  ? PLT 271 02/18/2022  ? NEUTROABS 2.3 02/18/2022  ? ? ?ASSESSMENT & PLAN:  ?Malignant neoplasm of upper-outer quadrant of right breast in female, estrogen receptor positive ([facility]) ?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, FISH negative   ?  ?Treatment plan: ?1.  Neoadjuvant chemotherapy with dose dense Adriamycin and Cytoxan followed by Taxol weekly x12   ?2. Mastectomy versus lumpectomy with targeted node dissection ?3.  Adjuvant radiation ?4.  Followed by adjuvant antiestrogen therapy (ovarian suppression) along with CDK 4 and 6 inhibitor ?5.  Genetic testing ?------------------------------------------------------------------------------------------------------------- ?  ?Current Treatment: Completed 4 cycles of dose dense Adriamycin and Cytoxan started 10/28/2018, today cycle 6 Taxol  ?CT chest angio: No PE, no mets ?ED visit 11/05/21: Severe back pain :CT Angio: No mets ?Bone scan 11/06/21 Bone scan: No mets ?MRI  guided biopsies 10/29/21: Left breast bx: Benign ?   ?Chemo Toxicities: ?1 muscle aches especially in the legs ?2. chemotherapy-induced anemia: Hemoglobin slowly improving ?Denies any peripheral neuropathy. ?  ?RTC weekly for chemo and every other week for follow-up with [place]. ?She will need a breast MRI after her last cycle of chemo and then follow-up with surgery. ?Once we are close to cycle 10 we will need to set up appointments with surgery and the breast MRI. ? ? ? ?No orders of the defined types  were placed in this encounter. ? ?The patient has a good understanding of the overall plan. she agrees with it. she will call with any problems that may develop before the next visit here. ?Total time spent: 30 mins including face to face time and time spent for planning, charting and co-ordination of care ? ? Harriette Ohara, MD ?02/18/22 ? ? ? I Gardiner Coins am scribing for Dr. Lindi Adie ? ?I have reviewed the above documentation for accuracy and completeness, and I agree with the above. ?  ?

## 2022-02-08 ENCOUNTER — Telehealth: Payer: Self-pay | Admitting: Genetic Counselor

## 2022-02-08 ENCOUNTER — Ambulatory Visit: Payer: Self-pay | Admitting: Genetic Counselor

## 2022-02-08 DIAGNOSIS — Z1379 Encounter for other screening for genetic and chromosomal anomalies: Secondary | ICD-10-CM

## 2022-02-08 NOTE — Telephone Encounter (Signed)
I contacted Ms. Taliercio to discuss her genetic testing results. A single pathogenic variant was detected in the BRCA2 gene ([id number]*). The additional 76 genes analyzed were Negative.  ? ?The test report has been scanned into EPIC and is located under the Molecular Pathology section of the Results Review tab.  A portion of the result report is included below for reference.  ? ?Lucille Passy, MS, [place] ?Genetic Counselor ?[email or web address] ?(P) [phone] ? ? ?

## 2022-02-08 NOTE — Progress Notes (Signed)
HPI:   ?Monica [name] was previously seen in the [facility] due to a personal and family history of cancer and concerns regarding a hereditary predisposition to cancer. Please refer to our prior cancer genetics clinic note for more information regarding our discussion, assessment and recommendations, at the time. Monica [name] recent genetic test results were disclosed to her, as were recommendations warranted by these results. These results and recommendations are discussed in more detail below. ? ?CANCER HISTORY:  ?Oncology History  ?Malignant neoplasm of upper-outer quadrant of right breast in female, estrogen receptor positive ([place])  ?10/16/2021 Initial Diagnosis  ? Malignant neoplasm of upper-outer quadrant of right breast in female, estrogen receptor positive ([place]) ? ?  ?10/16/2021 Cancer Staging  ? Staging form: Breast, AJCC 8th Edition ?- Clinical stage from 10/16/2021: Stage IIA (cT3, cN1, cM0, G2, ER+, PR+, HER2: Equivocal) - Signed by [name], MD on 10/16/2021 ?Histologic grading system: 3 grade system ? ?  ?10/28/2021 -  Chemotherapy  ? Patient is on Treatment Plan : BREAST ADJUVANT DOSE DENSE AC q14d / PACLitaxel q7d  ? ?  ?  ? Genetic Testing  ? [facility] CancerNext-Expanded identified a single pathogenic variant in the BRCA2 gene ([id number]*). Report date is 01/21/2022. ? ?The CancerNext-Expanded gene panel offered by [facility] and includes sequencing, rearrangement, and RNA analysis for the following 77 genes: AIP, ALK, APC, ATM, AXIN2, BAP1, BARD1, BLM, BMPR1A, BRCA1, BRCA2, BRIP1, CDC73, CDH1, CDK4, CDKN1B, CDKN2A, CHEK2, CTNNA1, DICER1, FANCC, FH, FLCN, GALNT12, KIF1B, LZTR1, MAX, MEN1, MET, MLH1, MSH2, MSH3, MSH6, MUTYH, NBN, NF1, NF2, NTHL1, PALB2, PHOX2B, PMS2, POT1, PRKAR1A, PTCH1, PTEN, RAD51C, RAD51D, RB1, RECQL, RET, SDHA, SDHAF2, SDHB, SDHC, SDHD, SMAD4, SMARCA4, SMARCB1, SMARCE1, STK11, SUFU, TMEM127, TP53, TSC1, TSC2, VHL and XRCC2 (sequencing and  deletion/duplication); EGFR, EGLN1, HOXB13, KIT, MITF, PDGFRA, POLD1, and POLE (sequencing only); EPCAM and GREM1 (deletion/duplication only).  ?  ? ? ?FAMILY HISTORY:  ?We obtained a detailed, 4-generation family history.  Significant diagnoses are listed below: ?     ?Family History  ?Problem Relation Age of Onset  ? Breast cancer Maternal Grandmother 59  ?  ?  ? ?Monica [name]'s maternal grandmother was diagnosed with breast cancer at age 35 and died at age 32. Of note, she does not have any information about her paternal family medical history. Monica [name] is unaware of previous family history of genetic testing for hereditary cancer risks. There is no reported Ashkenazi Jewish ancestry.  ? ?GENETIC TEST RESULTS:  ?Monica [name] tested positive for a single pathogenic variant (harmful genetic change) in the BRCA2 gene. Specifically, this variant is [id number]*. ? ?The test report has been scanned into EPIC and is located under the Molecular Pathology section of the Results Review tab.  A portion of the result report is included below for reference. Genetic testing reported out on 01/21/2022.  ? ? ? ? ? ? ?Clinical Information: ?Hereditary breast and ovarian cancer (HBOC) syndrome is characterized by an increased lifetime risk for, generally, adult-onset cancers including, breast, contralateral breast, female breast, ovarian, prostate, melanoma and pancreatic. ? ?The cancers associated with BRCA2 are: ?Female breast cancer, up to an 84% risk ?In women with a history of breast cancer, the cumulative risk for contralateral breast cancer 5 years after breast cancer diagnosis is 9%. The cumulative 20 year risk is approximately 26%. ?Female breast cancer, up to an 8% risk ?Ovarian cancer, 17-27% risk ?Pancreatic cancer, up to a 7% risk ?Prostate cancer, up to a 34%  risk ?Melanoma, elevated risk ?  ?Management Recommendations: ? ?Breast Screening/Risk Reduction: ? ?Women: ?Breast cancer screening includes: ?Breast awareness  beginning at age 75 ?Monthly self-breast examination beginning at age 23 ?Clinical breast examination every 6-12 months beginning at age 88 or at the age of the earliest diagnosed breast cancer in the family, if onset was before age 61 ?Annual breast MRI with contrast beginning at age 22-29 (or annual mammograms with consideration of tomosynthesis if MRI is unavailable), although the age to initiate screening may be individualized based on family history ?Annual mammogram beginning at age 27 until age 35 with consideration of tomosynthesis with continuation of annual breast MRI with contrast ?The option of prophylactic bilateral risk-reducing mastectomy (RRM), removal of the breast tissue before cancer develops, is the best option for significantly decreasing the risk of developing breast cancer. Studies have shown mastectomies reduce the risk of breast cancer by 90-95% in women with a BRCA2 mutation. Breast reconstruction can also be performed immediately following the mastectomy, depending on the type of reconstruction chosen. ?For women with a BRCA2 pathogenic or likely pathogenic variant who are treated for breast cancer and have not had a bilateral mastectomy, screening with annual mammogram with consideration of tomosynthesis and breast MRI should continue as described above. ? ?Males: ?Breast self-exam training and education starting at age 51 years ?Annual clinical breast exam starting at age 23 years  ?Consider annual mammogram starting at age 26 or 2 years before the earliest known female breast cancer in the family (whichever comes first). ?  ?Gynecological Cancer Screening/Risk Reduction: ?It is recommended that women with a BRCA2 mutation consider having a risk-reducing salpingo oophorectomy (RRSO), removal of the ovaries and fallopian tubes, between the ages of 65-45 or once childbearing is completed. Having a RRSO is estimated to reduce the risk of ovarian cancer by up to 96%. There is still a small  risk of developing an "ovarian-like" cancer in the lining of the abdomen, called the peritoneum. ?Another benefit to having the ovaries removed is the risk reduction for breast cancer. If the ovaries are removed before menopause, the risk of developing breast cancer is reduced. ?Women undergoing a RRSO should be aware of the potential risks and benefits of concurrent hysterectomy. Hormone replacement therapy could be considered based on the physician's discretion. ?Ovarian cancer screening is an option for women who chose not to have a RRSO or who, as of yet, have not completed their family. Current screening methods for ovarian cancer are neither sensitive nor specific, meaning that often early stage ovarian cancer cannot be diagnosed through this screening.  Screening can also be falsely positive with no cancer present. For this reason, ovarian cancer screening is not recommended for women beyond their child-bearing years. If ovarian cancer screening is recommended by your physician, it could include: ?CA-125 blood tests ?Transvaginal ultrasounds ?Clinical pelvic exams ?  ?Skin Cancer Screening and Risk Reduction: ?Regular skin self-examinations ?Individuals should notify their physicians of any changes to moles such as increasing in size, darkening in color, or other change in appearance. ?Annual skin examinations by a dermatologist  ?Follow sun-safety recommendations such as: ?Using UVA and UVB 30 SPF or higher sunscreen ?Avoiding sunburns ?Limiting sun exposure, especially during the hours of 11am-4pm  ?Wearing protective clothing and sunglasses ?Avoid using tanning beds ?For more information about the prevention of melanoma visit [email or web address] ?  ?Prostate Cancer Screening: ?Annual digital rectal exam (DRE) at age 79 ?Annual PSA blood test at age 93 ? ?Pancreatic Cancer Screening/Risk  Reduction: ?Avoid smoking, heavy alcohol use, and obesity. ?It has been suggested that pancreatic cancer screening be  limited to those with a family history of pancreatic cancer (first- or second-degree relative). Ideally, screening should be performed in experienced centers utilizing a multidisciplinary approach under research conditions. Rec

## 2022-02-10 ENCOUNTER — Other Ambulatory Visit: Payer: Self-pay

## 2022-02-10 ENCOUNTER — Other Ambulatory Visit: Payer: Self-pay | Admitting: *Deleted

## 2022-02-10 ENCOUNTER — Inpatient Hospital Stay: Payer: Medicaid Other

## 2022-02-10 ENCOUNTER — Inpatient Hospital Stay (HOSPITAL_BASED_OUTPATIENT_CLINIC_OR_DEPARTMENT_OTHER): Payer: Medicaid Other | Admitting: Physician Assistant

## 2022-02-10 ENCOUNTER — Other Ambulatory Visit: Payer: Self-pay | Admitting: Physician Assistant

## 2022-02-10 VITALS — BP 109/77 | HR 88 | Temp 98.6°F | Resp 17 | Wt 141.2 lb

## 2022-02-10 DIAGNOSIS — R197 Diarrhea, unspecified: Secondary | ICD-10-CM

## 2022-02-10 DIAGNOSIS — Z95828 Presence of other vascular implants and grafts: Secondary | ICD-10-CM

## 2022-02-10 DIAGNOSIS — Z170 Estrogen receptor positive status [ER+]: Secondary | ICD-10-CM

## 2022-02-10 DIAGNOSIS — Z5111 Encounter for antineoplastic chemotherapy: Secondary | ICD-10-CM | POA: Diagnosis not present

## 2022-02-10 LAB — CMP (CANCER CENTER ONLY)
ALT: 33 U/L (ref 0–44)
AST: 41 U/L (ref 15–41)
Albumin: 4.3 g/dL (ref 3.5–5.0)
Alkaline Phosphatase: 36 U/L — ABNORMAL LOW (ref 38–126)
Anion gap: 8 (ref 5–15)
BUN: 10 mg/dL (ref 6–20)
CO2: 26 mmol/L (ref 22–32)
Calcium: 9.1 mg/dL (ref 8.9–10.3)
Chloride: 106 mmol/L (ref 98–111)
Creatinine: 0.63 mg/dL (ref 0.44–1.00)
GFR, Estimated: >60 mL/min (ref >60)
Glucose, Bld: 95 mg/dL (ref 70–99)
Potassium: 4 mmol/L (ref 3.5–5.1)
Sodium: 140 mmol/L (ref 135–145)
Total Bilirubin: 0.4 mg/dL (ref 0.3–1.2)
Total Protein: 6.5 g/dL (ref 6.5–8.1)

## 2022-02-10 LAB — CBC WITH DIFFERENTIAL (CANCER CENTER ONLY)
Abs Immature Granulocytes: 0.07 10*3/uL (ref 0.00–0.07)
Basophils Absolute: 0.1 10*3/uL (ref 0.0–0.1)
Basophils Relative: 1 %
Eosinophils Absolute: 0.1 10*3/uL (ref 0.0–0.5)
Eosinophils Relative: 4 %
HCT: 30.9 % — ABNORMAL LOW (ref 36.0–46.0)
Hemoglobin: 11.1 g/dL — ABNORMAL LOW (ref 12.0–15.0)
Immature Granulocytes: 2 %
Lymphocytes Relative: 33 %
Lymphs Abs: 1.2 10*3/uL (ref 0.7–4.0)
MCH: 34.2 pg — ABNORMAL HIGH (ref 26.0–34.0)
MCHC: 35.9 g/dL (ref 30.0–36.0)
MCV: 95.1 fL (ref 80.0–100.0)
Monocytes Absolute: 0.3 10*3/uL (ref 0.1–1.0)
Monocytes Relative: 8 %
Neutro Abs: 1.9 10*3/uL (ref 1.7–7.7)
Neutrophils Relative %: 52 %
Platelet Count: 289 10*3/uL (ref 150–400)
RBC: 3.25 MIL/uL — ABNORMAL LOW (ref 3.87–5.11)
RDW: 13.5 % (ref 11.5–15.5)
WBC Count: 3.8 10*3/uL — ABNORMAL LOW (ref 4.0–10.5)
nRBC: 0 % (ref 0.0–0.2)

## 2022-02-10 LAB — C DIFFICILE QUICK SCREEN W PCR REFLEX
C Diff antigen: NEGATIVE
C Diff interpretation: NOT DETECTED
C Diff toxin: NEGATIVE

## 2022-02-10 LAB — PREGNANCY, URINE: Preg Test, Ur: NEGATIVE

## 2022-02-10 MED ORDER — SODIUM CHLORIDE 0.9 % IV SOLN: Freq: Once | INTRAVENOUS | Status: AC

## 2022-02-10 MED ORDER — DIPHENHYDRAMINE HCL 50 MG/ML IJ SOLN
50.0000 mg | Freq: Once | INTRAMUSCULAR | Status: AC
  Administered 2022-02-10: 50 mg via INTRAVENOUS
  Filled 2022-02-10: qty 1

## 2022-02-10 MED ORDER — HEPARIN SOD (PORK) LOCK FLUSH 100 UNIT/ML IV SOLN
500.0000 [IU] | Freq: Once | INTRAVENOUS | Status: AC | PRN
  Administered 2022-02-10: 500 [IU]

## 2022-02-10 MED ORDER — FAMOTIDINE IN NACL 20-0.9 MG/50ML-% IV SOLN
20.0000 mg | Freq: Once | INTRAVENOUS | Status: AC
  Administered 2022-02-10: 20 mg via INTRAVENOUS
  Filled 2022-02-10: qty 50

## 2022-02-10 MED ORDER — SODIUM CHLORIDE 0.9% FLUSH
10.0000 mL | Freq: Once | INTRAVENOUS | Status: AC
  Administered 2022-02-10: 10 mL

## 2022-02-10 MED ORDER — SODIUM CHLORIDE 0.9 % IV SOLN
80.0000 mg/m2 | Freq: Once | INTRAVENOUS | Status: AC
  Administered 2022-02-10: 138 mg via INTRAVENOUS
  Filled 2022-02-10: qty 23

## 2022-02-10 MED ORDER — SODIUM CHLORIDE 0.9% FLUSH
10.0000 mL | INTRAVENOUS | Status: DC | PRN
  Administered 2022-02-10: 10 mL

## 2022-02-10 MED ORDER — SODIUM CHLORIDE 0.9 % IV SOLN
10.0000 mg | Freq: Once | INTRAVENOUS | Status: AC
  Administered 2022-02-10: 10 mg via INTRAVENOUS
  Filled 2022-02-10: qty 10

## 2022-02-10 NOTE — Progress Notes (Signed)
? ? ? ?Symptom Management Consult note ?[place]   ? ?Patient Care Team: ?Default, Provider, MD as PCP - General ?Monica Kaufmann, RN as Oncology Nurse Navigator ?Monica Germany, RN as Oncology Nurse Navigator  ? ? ?Name of the patient: Monica [name]  [id number]  06-Jul-1996  ? ?Date of visit: 02/10/2022  ? ? ?Chief complaint/ Reason for visit- diarrhea ? ?Oncology History  ?Malignant neoplasm of upper-outer quadrant of right breast in female, estrogen receptor positive ([place])  ?10/16/2021 Initial Diagnosis  ? Malignant neoplasm of upper-outer quadrant of right breast in female, estrogen receptor positive ([place]) ? ?  ?10/16/2021 Cancer Staging  ? Staging form: Breast, AJCC 8th Edition ?- Clinical stage from 10/16/2021: Stage IIA (cT3, cN1, cM0, G2, ER+, PR+, HER2: Equivocal) - Signed by [name], MD on 10/16/2021 ?Histologic grading system: 3 grade system ? ?  ?10/28/2021 -  Chemotherapy  ? Patient is on Treatment Plan : BREAST ADJUVANT DOSE DENSE AC q14d / PACLitaxel q7d  ? ?  ?  ? Genetic Testing  ? [facility] CancerNext-Expanded identified a single pathogenic variant in the BRCA2 gene (T.S1779*). Report date is 01/21/2022. ? ?The CancerNext-Expanded gene panel offered by [facility] and includes sequencing, rearrangement, and RNA analysis for the following 77 genes: AIP, ALK, APC, ATM, AXIN2, BAP1, BARD1, BLM, BMPR1A, BRCA1, BRCA2, BRIP1, CDC73, CDH1, CDK4, CDKN1B, CDKN2A, CHEK2, CTNNA1, DICER1, FANCC, FH, FLCN, GALNT12, KIF1B, LZTR1, MAX, MEN1, MET, MLH1, MSH2, MSH3, MSH6, MUTYH, NBN, NF1, NF2, NTHL1, PALB2, PHOX2B, PMS2, POT1, PRKAR1A, PTCH1, PTEN, RAD51C, RAD51D, RB1, RECQL, RET, SDHA, SDHAF2, SDHB, SDHC, SDHD, SMAD4, SMARCA4, SMARCB1, SMARCE1, STK11, SUFU, TMEM127, TP53, TSC1, TSC2, VHL and XRCC2 (sequencing and deletion/duplication); EGFR, EGLN1, HOXB13, KIT, MITF, PDGFRA, POLD1, and POLE (sequencing only); EPCAM and GREM1 (deletion/duplication only).  ?  ? ? ?Current Therapy: paclitaxel day  1 cycle 5 today ? ?Interval history- Monica [name] is a 26 yo female with oncologic history as above seen in infusion center today with chief complaint of diarrhea x 7 days.  Patient recently traveled to [place] and returned on 02/01/2022 shortly after returning home diarrhea began.  She states when it first started she was having approximately 10 episodes per day.  It has since slowed down over the last 2 days she reports.  She states originally it was liquid in consistency however now is soft and formed.  She reports having history of hemorrhoids and thinks she also has one now as she noticed some bright red blood in the toilet.  She denies any current rectal pain.  She admits to intermittent abdominal cramping that is present just prior to having a bowel movement. She has had some nausea and reports it is not bad enough to need medication for. She is tolerating PO intake well. She has been taking Imodium intermittently, last dose was x 4 days ago. Her spouse who accompanied her on the trip is also having diarrhea with cramping. Denies fever, chills, chest pain, shortness of breath, back pain, urinary symptoms, rash, black tarry stool. ? ? ? ? ?ROS  ?All other systems are reviewed and are negative for acute change except as noted in the HPI. ? ? ? ?No Known Allergies ? ? ?Past Medical History:  ?Diagnosis Date  ? Abscess   ? Cancer ([place]) 10/16/2021  ? right breast cancer  ? ? ? ?Past Surgical History:  ?Procedure Laterality Date  ? PORTACATH PLACEMENT Left 10/27/2021  ? Procedure: INSERTION PORT-A-CATH;  Surgeon: Stark Klein, MD;  Location: [facility] OR;  Service: General;  Laterality: Left;  ? ? ?Social History  ? ?Socioeconomic History  ? Marital status: Married  ?  Spouse name: Not on file  ? Number of children: Not on file  ? Years of education: Not on file  ? Highest education level: Not on file  ?Occupational History  ? Not on file  ?Tobacco Use  ? Smoking status: Never  ? Smokeless tobacco: Never  ?Vaping Use  ?  Vaping Use: Never used  ?Substance and Sexual Activity  ? Alcohol use: No  ? Drug use: Never  ? Sexual activity: Yes  ?  Birth control/protection: Other-see comments  ?  Comment: Depo Shot  ?Other Topics Concern  ? Not on file  ?Social History Narrative  ? Not on file  ? ?Social Determinants of Health  ? ?Financial Resource Strain: Not on file  ?Food Insecurity: Not on file  ?Transportation Needs: Not on file  ?Physical Activity: Not on file  ?Stress: Not on file  ?Social Connections: Not on file  ?Intimate Partner Violence: Not on file  ? ? ?Family History  ?Problem Relation Age of Onset  ? Breast cancer Maternal Grandmother 45  ? ? ? ?Current Outpatient Medications:  ?  dexamethasone (DECADRON) 4 MG tablet, Take 1 tablet (4 mg total) by mouth daily. Take 1 tablet day after chemo and 1 tablet 2 days after chemo with food, Disp: 8 tablet, Rfl: 0 ?  gabapentin (NEURONTIN) 100 MG capsule, Take 100 mg by mouth daily., Disp: , Rfl:  ?  lidocaine-prilocaine (EMLA) cream, Apply to affected area once, Disp: 30 g, Rfl: 3 ?  LORazepam (ATIVAN) 0.5 MG tablet, Take 1 tablet (0.5 mg total) by mouth at bedtime as needed for sleep., Disp: 30 tablet, Rfl: 0 ?  naproxen (NAPROSYN) 500 MG tablet, Take 500 mg by mouth 2 (two) times daily., Disp: , Rfl:  ?  ondansetron (ZOFRAN) 8 MG tablet, Take 1 tablet (8 mg total) by mouth 2 (two) times daily as needed. Start on the third day after chemotherapy., Disp: 30 tablet, Rfl: 1 ?  prochlorperazine (COMPAZINE) 10 MG tablet, Take 1 tablet (10 mg total) by mouth every 6 (six) hours as needed (Nausea or vomiting)., Disp: 30 tablet, Rfl: 1 ?  VENTOLIN HFA 108 (90 Base) MCG/ACT inhaler, , Disp: , Rfl:  ? ?PHYSICAL EXAM: ?ECOG FS:1 - Symptomatic but completely ambulatory ? ? T: 98.6   BP: 109/77  HR: 88  Resp: 17 O2: 100% ?Physical Exam ?Vitals and nursing note reviewed.  ?Constitutional:   ?   Appearance: She is well-developed. She is not ill-appearing or toxic-appearing.  ?HENT:  ?   Head:  Normocephalic and atraumatic.  ?   Nose: Nose normal.  ?   Mouth/Throat:  ?   Mouth: Mucous membranes are moist.  ?   Pharynx: Oropharynx is clear.  ?Eyes:  ?   General: No scleral icterus.    ?   Right eye: No discharge.     ?   Left eye: No discharge.  ?   Conjunctiva/sclera: Conjunctivae normal.  ?Neck:  ?   Vascular: No JVD.  ?Cardiovascular:  ?   Rate and Rhythm: Normal rate and regular rhythm.  ?   Pulses: Normal pulses.  ?   Heart sounds: Normal heart sounds.  ?Pulmonary:  ?   Effort: Pulmonary effort is normal.  ?   Breath sounds: Normal breath sounds.  ?Abdominal:  ?   General: There is no  distension.  ?   Palpations: Abdomen is soft. There is no mass.  ?   Tenderness: There is no abdominal tenderness. There is no guarding or rebound.  ?   Hernia: No hernia is present.  ?Musculoskeletal:     ?   General: Normal range of motion.  ?   Cervical back: Normal range of motion.  ?Skin: ?   General: Skin is warm and dry.  ?   Capillary Refill: Capillary refill takes less than 2 seconds.  ?Neurological:  ?   Mental Status: She is oriented to person, place, and time.  ?   GCS: GCS eye subscore is 4. GCS verbal subscore is 5. GCS motor subscore is 6.  ?   Comments: Fluent speech, no facial droop.  ?Psychiatric:     ?   Behavior: Behavior normal.  ?  ? ? ? ?LABORATORY DATA: ?I have reviewed the data as listed ? ?  Latest Ref Rng & Units 02/10/2022  ?  8:53 AM 02/03/2022  ? 10:25 AM 01/27/2022  ?  8:26 AM  ?CBC  ?WBC 4.0 - 10.5 K/uL 3.8   4.6   3.2    ?Hemoglobin 12.0 - 15.0 g/dL 11.1   10.8   11.2    ?Hematocrit 36.0 - 46.0 % 30.9   30.5   31.2    ?Platelets 150 - 400 K/uL 289   300   277    ? ? ? ? ?  Latest Ref Rng & Units 02/10/2022  ?  8:53 AM 02/03/2022  ? 10:25 AM 01/27/2022  ?  8:26 AM  ?CMP  ?Glucose 70 - 99 mg/dL 95   114   100    ?BUN 6 - 20 mg/dL _0 ?Creatinine 0.44 - 1.00 mg/dL 0.63   0.67   0.63    ?Sodium 135 - 145 mmol/L 140   139   138    ?Potassium 3.5 - 5.1 mmol/L 4.0   3.6   3.8    ?Chloride  98 - 111 mmol/L 106   106   106    ?CO2 22 - 32 mmol/L _1 ?Calcium 8.9 - 10.3 mg/dL 9.1   9.1   9.8    ?Total Protein 6.5 - 8.1 g/dL 6.5   6.5   6.9    ?Total Bilirubin 0.3 - 1.2 mg/dL 0.4   0

## 2022-02-10 NOTE — Progress Notes (Signed)
Pt c/o vomiting and diarrhea for about a week after a recent visit to [place]. MD and symptom management team notified and PA will see patient during infusion visit today to assess. VS and labs WNL. ?

## 2022-02-11 ENCOUNTER — Encounter: Payer: Self-pay | Admitting: Genetic Counselor

## 2022-02-11 ENCOUNTER — Inpatient Hospital Stay (HOSPITAL_BASED_OUTPATIENT_CLINIC_OR_DEPARTMENT_OTHER): Payer: Medicaid Other | Admitting: Physician Assistant

## 2022-02-11 DIAGNOSIS — A09 Infectious gastroenteritis and colitis, unspecified: Secondary | ICD-10-CM

## 2022-02-11 DIAGNOSIS — Z5111 Encounter for antineoplastic chemotherapy: Secondary | ICD-10-CM | POA: Diagnosis not present

## 2022-02-11 LAB — GASTROINTESTINAL PANEL BY PCR, STOOL (REPLACES STOOL CULTURE)
Adenovirus F40/41: NOT DETECTED
Astrovirus: NOT DETECTED
Campylobacter species: NOT DETECTED
Cryptosporidium: NOT DETECTED
Cyclospora cayetanensis: NOT DETECTED
E. coli O157: NOT DETECTED
Entamoeba histolytica: NOT DETECTED
Enteroaggregative E coli (EAEC): DETECTED — AB
Enterotoxigenic E coli (ETEC): NOT DETECTED
Giardia lamblia: NOT DETECTED
Norovirus GI/GII: NOT DETECTED
Plesimonas shigelloides: NOT DETECTED
Rotavirus A: NOT DETECTED
Salmonella species: NOT DETECTED
Sapovirus (I, II, IV, and V): NOT DETECTED
Shiga like toxin producing E coli (STEC): DETECTED — AB
Shigella/Enteroinvasive E coli (EIEC): NOT DETECTED
Vibrio cholerae: NOT DETECTED
Vibrio species: NOT DETECTED
Yersinia enterocolitica: NOT DETECTED

## 2022-02-11 MED ORDER — AZITHROMYCIN 500 MG PO TABS: 500.0000 mg | ORAL_TABLET | Freq: Every day | ORAL | 0 refills | Status: AC

## 2022-02-11 NOTE — Progress Notes (Signed)
I connected with Monica [name] on 02/11/22 at  4:00 PM EDT by telephone and verified that I am speaking with the correct person using two identifiers.  ? ?I discussed the limitations, risks, security and privacy concerns of performing an evaluation and management service by telemedicine and the availability of in-person appointments. I also discussed with the patient that there may be a patient responsible charge related to this service. The patient expressed understanding and agreed to proceed.  ? ?Other persons participating in the visit and their role in the encounter: none  ? ?Patient?s location: Car  ?Provider?s location: Office in [place]  ? ? ? ? ? ?Symptom Management Consult note ?[place]   ? ?Patient Care Team: ?Default, Provider, MD as PCP - General ?[name], RN as Oncology Nurse Navigator ?[name], RN as Oncology Nurse Navigator  ? ? ?Name of the patient: Monica [name]  [id number]  [birth year]  ? ?Date of visit: 02/11/2022  ? ? ?Chief complaint/ Reason for visit- f/u diarrhea ? ?Oncology History  ?Malignant neoplasm of upper-outer quadrant of right breast in female, estrogen receptor positive ([place])  ?10/16/2021 Initial Diagnosis  ? Malignant neoplasm of upper-outer quadrant of right breast in female, estrogen receptor positive ([place]) ? ?  ?10/16/2021 Cancer Staging  ? Staging form: Breast, AJCC 8th Edition ?- Clinical stage from 10/16/2021: Stage IIA (cT3, cN1, cM0, G2, ER+, PR+, HER2: Equivocal) - Signed by [name], MD on 10/16/2021 ?Histologic grading system: 3 grade system ? ?  ?10/28/2021 -  Chemotherapy  ? Patient is on Treatment Plan : BREAST ADJUVANT DOSE DENSE AC q14d / PACLitaxel q7d  ? ?  ?  ? Genetic Testing  ? 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 Report date is 01/21/2022. ? ?The CancerNext-Expanded gene panel offered by [facility] and includes sequencing, rearrangement, and RNA analysis for  the following 77 genes: AIP, ALK, APC, ATM, AXIN2, BAP1, BARD1, BLM, BMPR1A, BRCA1, BRCA2, BRIP1, CDC73, CDH1, CDK4, CDKN1B, CDKN2A, CHEK2, CTNNA1, DICER1, FANCC, FH, FLCN, GALNT12, KIF1B, LZTR1, MAX, MEN1, MET, MLH1, MSH2, MSH3, MSH6, MUTYH, NBN, NF1, NF2, NTHL1, PALB2, PHOX2B, PMS2, POT1, PRKAR1A, PTCH1, PTEN, RAD51C, RAD51D, RB1, RECQL, RET, SDHA, SDHAF2, SDHB, SDHC, SDHD, SMAD4, SMARCA4, SMARCB1, SMARCE1, STK11, SUFU, TMEM127, TP53, TSC1, TSC2, VHL and XRCC2 (sequencing and deletion/duplication); EGFR, EGLN1, HOXB13, KIT, MITF, PDGFRA, POLD1, and POLE (sequencing only); EPCAM and GREM1 (deletion/duplication only).  ?  ? ? ?Current Therapy: Paclitaxel day 1 cycle 5 yesterday ? ?Interval history- Monica [name] is a 26 year old female with oncologic history as above contact today for a telephone visit to discuss test results in regards to the diarrhea she has been experiencing.  Patient states the diarrhea is improving and less frequent than it has been.  She denies any abdominal pain and states she overall feels well.  Denies any fever, chills, nausea, vomiting, urinary symptoms, rash.  Has not been needing to take Imodium. ? ? ? ?ROS  ?All other systems are reviewed and are negative for acute change except as noted in the HPI. ? ? ? ?No Known Allergies ? ? ?Past Medical History:  ?Diagnosis Date  ? Abscess   ? Cancer ([place]) 10/16/2021  ? right breast cancer  ? ? ? ?Past Surgical History:  ?Procedure Laterality Date  ? PORTACATH PLACEMENT Left 10/27/2021  ? Procedure: INSERTION PORT-A-CATH;  Surgeon: Stark Klein, MD;  Location: [facility];  Service: General;  Laterality: Left;  ? ? ?Social  History  ? ?Socioeconomic History  ? Marital status: Married  ?  Spouse name: Not on file  ? Number of children: Not on file  ? Years of education: Not on file  ? Highest education level: Not on file  ?Occupational History  ? Not on file  ?Tobacco Use  ? Smoking status: Never  ? Smokeless tobacco: Never  ?Vaping Use  ? Vaping Use:  Never used  ?Substance and Sexual Activity  ? Alcohol use: No  ? Drug use: Never  ? Sexual activity: Yes  ?  Birth control/protection: Other-see comments  ?  Comment: Depo Shot  ?Other Topics Concern  ? Not on file  ?Social History Narrative  ? Not on file  ? ?Social Determinants of Health  ? ?Financial Resource Strain: Not on file  ?Food Insecurity: Not on file  ?Transportation Needs: Not on file  ?Physical Activity: Not on file  ?Stress: Not on file  ?Social Connections: Not on file  ?Intimate Partner Violence: Not on file  ? ? ?Family History  ?Problem Relation Age of Onset  ? Breast cancer Maternal Grandmother 23  ? ? ? ?Current Outpatient Medications:  ?  azithromycin (ZITHROMAX) 500 MG tablet, Take 1 tablet (500 mg total) by mouth daily for 3 days., Disp: 3 tablet, Rfl: 0 ?  dexamethasone (DECADRON) 4 MG tablet, Take 1 tablet (4 mg total) by mouth daily. Take 1 tablet day after chemo and 1 tablet 2 days after chemo with food, Disp: 8 tablet, Rfl: 0 ?  gabapentin (NEURONTIN) 100 MG capsule, Take 100 mg by mouth daily., Disp: , Rfl:  ?  lidocaine-prilocaine (EMLA) cream, Apply to affected area once, Disp: 30 g, Rfl: 3 ?  LORazepam (ATIVAN) 0.5 MG tablet, Take 1 tablet (0.5 mg total) by mouth at bedtime as needed for sleep., Disp: 30 tablet, Rfl: 0 ?  naproxen (NAPROSYN) 500 MG tablet, Take 500 mg by mouth 2 (two) times daily., Disp: , Rfl:  ?  ondansetron (ZOFRAN) 8 MG tablet, Take 1 tablet (8 mg total) by mouth 2 (two) times daily as needed. Start on the third day after chemotherapy., Disp: 30 tablet, Rfl: 1 ?  prochlorperazine (COMPAZINE) 10 MG tablet, Take 1 tablet (10 mg total) by mouth every 6 (six) hours as needed (Nausea or vomiting)., Disp: 30 tablet, Rfl: 1 ?  VENTOLIN HFA 108 (90 Base) MCG/ACT inhaler, , Disp: , Rfl:  ? ?PHYSICAL EXAM: ?ECOG FS:1 - Symptomatic but completely ambulatory ? ? There were no vitals filed for this visit. ? ?Patient speaking in clear sentences, no respiratory  distress ? ? ? ?LABORATORY DATA: ?I have reviewed the data as listed ? ?  Latest Ref Rng & Units 02/10/2022  ?  8:53 AM 02/03/2022  ? 10:25 AM 01/27/2022  ?  8:26 AM  ?CBC  ?WBC 4.0 - 10.5 K/uL 3.8   4.6   3.2    ?Hemoglobin 12.0 - 15.0 g/dL 11.1   10.8   11.2    ?Hematocrit 36.0 - 46.0 % 30.9   30.5   31.2    ?Platelets 150 - 400 K/uL 289   300   277    ? ? ? ? ?  Latest Ref Rng & Units 02/10/2022  ?  8:53 AM 02/03/2022  ? 10:25 AM 01/27/2022  ?  8:26 AM  ?CMP  ?Glucose 70 - 99 mg/dL 95   114   100    ?BUN 6 - 20 mg/dL 10   13  13    ?Creatinine 0.44 - 1.00 mg/dL 0.63   0.67   0.63    ?Sodium 135 - 145 mmol/L 140   139   138    ?Potassium 3.5 - 5.1 mmol/L 4.0   3.6   3.8    ?Chloride 98 - 111 mmol/L 106   106   106    ?CO2 22 - 32 mmol/L _0 ?Calcium 8.9 - 10.3 mg/dL 9.1   9.1   9.8    ?Total Protein 6.5 - 8.1 g/dL 6.5   6.5   6.9    ?Total Bilirubin 0.3 - 1.2 mg/dL 0.4   0.8   0.5    ?Alkaline Phos 38 - 126 U/L 36   38   34    ?AST 15 - 41 U/L 41   25   28    ?ALT 0 - 44 U/L 33   17   37    ? ? ? ? ? ?RADIOGRAPHIC STUDIES: ?I have personally reviewed the radiological images as listed and agreed with the findings in the report. ?No images are attached to the encounter. ?No results found.  ? ?ASSESSMENT & PLAN: ?Patient is a 26 y.o. female with history of malignant neoplasm of upper-outer quadrant of right breast in female, estrogen receptor positive followed by oncologist Dr. Lindi Adie. ? ?#)Diarrhea- C. difficile testing yesterday was negative.  Stool PCR did result positive for Enteroaggregative E. coli (EAEC) and Shiga-like toxin-producing E. coli (STEC). As patient is immunocompromised prescription for Azithromycin sent to her pharmacy. Discussed the importance of staying hydrated and ED precautions should symptoms worsen. ? ?  ?Visit Diagnosis: ?1. Diarrhea of infectious origin   ? ? ? ?No orders of the defined types were placed in this encounter. ? ? ?All questions were answered. The patient knows to call  the clinic with any problems, questions or concerns. No barriers to learning was detected. ? ?Time spent with patient on telephone encounter: 10 minutes ? ? ?Thank you for allowing me to participate in the care of t

## 2022-02-16 ENCOUNTER — Other Ambulatory Visit: Payer: Self-pay

## 2022-02-18 ENCOUNTER — Inpatient Hospital Stay: Payer: Medicaid Other

## 2022-02-18 ENCOUNTER — Other Ambulatory Visit: Payer: Self-pay

## 2022-02-18 ENCOUNTER — Inpatient Hospital Stay: Payer: Medicaid Other | Attending: Hematology and Oncology | Admitting: Hematology and Oncology

## 2022-02-18 DIAGNOSIS — Z170 Estrogen receptor positive status [ER+]: Secondary | ICD-10-CM

## 2022-02-18 DIAGNOSIS — D6481 Anemia due to antineoplastic chemotherapy: Secondary | ICD-10-CM | POA: Insufficient documentation

## 2022-02-18 DIAGNOSIS — Z803 Family history of malignant neoplasm of breast: Secondary | ICD-10-CM | POA: Diagnosis not present

## 2022-02-18 DIAGNOSIS — T451X5A Adverse effect of antineoplastic and immunosuppressive drugs, initial encounter: Secondary | ICD-10-CM | POA: Insufficient documentation

## 2022-02-18 DIAGNOSIS — Z5111 Encounter for antineoplastic chemotherapy: Secondary | ICD-10-CM | POA: Insufficient documentation

## 2022-02-18 DIAGNOSIS — C50411 Malignant neoplasm of upper-outer quadrant of right female breast: Secondary | ICD-10-CM

## 2022-02-18 LAB — CBC WITH DIFFERENTIAL (CANCER CENTER ONLY)
Abs Immature Granulocytes: 0.04 10*3/uL (ref 0.00–0.07)
Basophils Absolute: 0 10*3/uL (ref 0.0–0.1)
Basophils Relative: 1 %
Eosinophils Absolute: 0.1 10*3/uL (ref 0.0–0.5)
Eosinophils Relative: 2 %
HCT: 32.1 % — ABNORMAL LOW (ref 36.0–46.0)
Hemoglobin: 11.5 g/dL — ABNORMAL LOW (ref 12.0–15.0)
Immature Granulocytes: 1 %
Lymphocytes Relative: 27 %
Lymphs Abs: 1 10*3/uL (ref 0.7–4.0)
MCH: 34.3 pg — ABNORMAL HIGH (ref 26.0–34.0)
MCHC: 35.8 g/dL (ref 30.0–36.0)
MCV: 95.8 fL (ref 80.0–100.0)
Monocytes Absolute: 0.3 10*3/uL (ref 0.1–1.0)
Monocytes Relative: 7 %
Neutro Abs: 2.3 10*3/uL (ref 1.7–7.7)
Neutrophils Relative %: 62 %
Platelet Count: 271 10*3/uL (ref 150–400)
RBC: 3.35 MIL/uL — ABNORMAL LOW (ref 3.87–5.11)
RDW: 13.3 % (ref 11.5–15.5)
WBC Count: 3.8 10*3/uL — ABNORMAL LOW (ref 4.0–10.5)
nRBC: 0 % (ref 0.0–0.2)

## 2022-02-18 LAB — CMP (CANCER CENTER ONLY)
ALT: 10 U/L (ref 0–44)
AST: 14 U/L — ABNORMAL LOW (ref 15–41)
Albumin: 4.4 g/dL (ref 3.5–5.0)
Alkaline Phosphatase: 31 U/L — ABNORMAL LOW (ref 38–126)
Anion gap: 7 (ref 5–15)
BUN: 14 mg/dL (ref 6–20)
CO2: 26 mmol/L (ref 22–32)
Calcium: 9.6 mg/dL (ref 8.9–10.3)
Chloride: 107 mmol/L (ref 98–111)
Creatinine: 0.64 mg/dL (ref 0.44–1.00)
GFR, Estimated: >60 mL/min (ref >60)
Glucose, Bld: 106 mg/dL — ABNORMAL HIGH (ref 70–99)
Potassium: 4 mmol/L (ref 3.5–5.1)
Sodium: 140 mmol/L (ref 135–145)
Total Bilirubin: 0.5 mg/dL (ref 0.3–1.2)
Total Protein: 6.8 g/dL (ref 6.5–8.1)

## 2022-02-18 LAB — PREGNANCY, URINE: Preg Test, Ur: NEGATIVE

## 2022-02-18 MED ORDER — FAMOTIDINE IN NACL 20-0.9 MG/50ML-% IV SOLN
20.0000 mg | Freq: Once | INTRAVENOUS | Status: AC
  Administered 2022-02-18: 20 mg via INTRAVENOUS
  Filled 2022-02-18: qty 50

## 2022-02-18 MED ORDER — DIPHENHYDRAMINE HCL 50 MG/ML IJ SOLN
50.0000 mg | Freq: Once | INTRAMUSCULAR | Status: AC
  Administered 2022-02-18: 50 mg via INTRAVENOUS
  Filled 2022-02-18: qty 1

## 2022-02-18 MED ORDER — SODIUM CHLORIDE 0.9 % IV SOLN
80.0000 mg/m2 | Freq: Once | INTRAVENOUS | Status: AC
  Administered 2022-02-18: 138 mg via INTRAVENOUS
  Filled 2022-02-18: qty 23

## 2022-02-18 MED ORDER — SODIUM CHLORIDE 0.9 % IV SOLN
10.0000 mg | Freq: Once | INTRAVENOUS | Status: AC
  Administered 2022-02-18: 10 mg via INTRAVENOUS
  Filled 2022-02-18: qty 10

## 2022-02-18 MED ORDER — HEPARIN SOD (PORK) LOCK FLUSH 100 UNIT/ML IV SOLN
500.0000 [IU] | Freq: Once | INTRAVENOUS | Status: AC | PRN
  Administered 2022-02-18: 500 [IU]

## 2022-02-18 MED ORDER — SODIUM CHLORIDE 0.9% FLUSH
10.0000 mL | INTRAVENOUS | Status: DC | PRN
  Administered 2022-02-18: 10 mL

## 2022-02-18 MED ORDER — SODIUM CHLORIDE 0.9 % IV SOLN: Freq: Once | INTRAVENOUS | Status: AC

## 2022-02-18 NOTE — Assessment & Plan Note (Signed)
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,?FISH negative?? ?? ?Treatment plan: ?1.??Neoadjuvant chemotherapy with dose dense Adriamycin and Cytoxan followed by Taxol weekly x12 ? ?2.?Mastectomy?versus lumpectomy with targeted node dissection ?3.??Adjuvant radiation ?4.??Followed by adjuvant antiestrogen therapy (ovarian suppression) along with CDK 4 and 6 inhibitor ?5.??Genetic testing ?------------------------------------------------------------------------------------------------------------- ?? ?Current Treatment: Completed 4 cycles of dose dense Adriamycin and Cytoxan started 10/28/2018, today cycle 6 Taxol  ?CT?chest angio: No PE, no mets ?ED visit 11/05/21: Severe back pain :CT Angio: No mets ?Bone scan 11/06/21?Bone scan: No mets ?MRI guided biopsies?10/29/21: Left breast bx: Benign ??? ?Chemo Toxicities: ?1 ?? ?RTC weekly for chemo and every other week for follow-up with me. ?

## 2022-02-18 NOTE — Patient Instructions (Signed)
[facility]  Discharge Instructions: ?Thank you for choosing [facility] to provide your oncology and hematology care.  ? ?If you have a lab appointment with the [facility], please go directly to the [facility] and check in at the registration area. ?  ?Wear comfortable clothing and clothing appropriate for easy access to any Portacath or PICC line.  ? ?We strive to give you quality time with your provider. You may need to reschedule your appointment if you arrive late (15 or more minutes).  Arriving late affects you and other patients whose appointments are after yours.  Also, if you miss three or more appointments without notifying the office, you may be dismissed from the clinic at the provider?s discretion.    ?  ?For prescription refill requests, have your pharmacy contact our office and allow 72 hours for refills to be completed.   ? ?Today you received the following chemotherapy and/or immunotherapy agents: Taxol   ?  ?To help prevent nausea and vomiting after your treatment, we encourage you to take your nausea medication as directed. ? ?BELOW ARE SYMPTOMS THAT SHOULD BE REPORTED IMMEDIATELY: ?*FEVER GREATER THAN 100.4 F (38 ?C) OR HIGHER ?*CHILLS OR SWEATING ?*NAUSEA AND VOMITING THAT IS NOT CONTROLLED WITH YOUR NAUSEA MEDICATION ?*UNUSUAL SHORTNESS OF BREATH ?*UNUSUAL BRUISING OR BLEEDING ?*URINARY PROBLEMS (pain or burning when urinating, or frequent urination) ?*BOWEL PROBLEMS (unusual diarrhea, constipation, pain near the anus) ?TENDERNESS IN MOUTH AND THROAT WITH OR WITHOUT PRESENCE OF ULCERS (sore throat, sores in mouth, or a toothache) ?UNUSUAL RASH, SWELLING OR PAIN  ?UNUSUAL VAGINAL DISCHARGE OR ITCHING  ? ?Items with * indicate a potential emergency and should be followed up as soon as possible or go to the Emergency Department if any problems should occur. ? ?Please show the CHEMOTHERAPY ALERT CARD or IMMUNOTHERAPY ALERT CARD at check-in to the  Emergency Department and triage nurse. ? ?Should you have questions after your visit or need to cancel or reschedule your appointment, please contact [facility]  Dept: [phone]  and follow the prompts.  Office hours are 8:00 a.m. to 4:30 p.m. Monday - Friday. Please note that voicemails left after 4:00 p.m. may not be returned until the following business day.  We are closed weekends and major holidays. You have access to a nurse at all times for urgent questions. Please call the main number to the clinic Dept: [phone] and follow the prompts. ? ? ?For any non-urgent questions, you may also contact your provider using MyChart. We now offer e-Visits for anyone 51 and older to request care online for non-urgent symptoms. For details visit [email or web address]. ?  ?Also download the MyChart app! Go to the app store, search "MyChart", open the app, select [facility], and log in with your MyChart username and password. ? ?Due to Covid, a mask is required upon entering the hospital/clinic. If you do not have a mask, one will be given to you upon arrival. For doctor visits, patients may have 1 support person aged 105 or older with them. For treatment visits, patients cannot have anyone with them due to current Covid guidelines and our immunocompromised population.  ? ?

## 2022-02-25 ENCOUNTER — Inpatient Hospital Stay: Payer: Medicaid Other

## 2022-02-25 ENCOUNTER — Other Ambulatory Visit: Payer: Self-pay

## 2022-02-25 VITALS — BP 119/85 | HR 90 | Temp 98.4°F | Resp 16 | Wt 142.0 lb

## 2022-02-25 DIAGNOSIS — Z170 Estrogen receptor positive status [ER+]: Secondary | ICD-10-CM

## 2022-02-25 DIAGNOSIS — Z5111 Encounter for antineoplastic chemotherapy: Secondary | ICD-10-CM | POA: Diagnosis not present

## 2022-02-25 DIAGNOSIS — Z95828 Presence of other vascular implants and grafts: Secondary | ICD-10-CM

## 2022-02-25 DIAGNOSIS — C50411 Malignant neoplasm of upper-outer quadrant of right female breast: Secondary | ICD-10-CM

## 2022-02-25 LAB — CBC WITH DIFFERENTIAL (CANCER CENTER ONLY)
Abs Immature Granulocytes: 0.05 10*3/uL (ref 0.00–0.07)
Basophils Absolute: 0.1 10*3/uL (ref 0.0–0.1)
Basophils Relative: 1 %
Eosinophils Absolute: 0.1 10*3/uL (ref 0.0–0.5)
Eosinophils Relative: 2 %
HCT: 32.9 % — ABNORMAL LOW (ref 36.0–46.0)
Hemoglobin: 11.5 g/dL — ABNORMAL LOW (ref 12.0–15.0)
Immature Granulocytes: 1 %
Lymphocytes Relative: 23 %
Lymphs Abs: 1 10*3/uL (ref 0.7–4.0)
MCH: 33.3 pg (ref 26.0–34.0)
MCHC: 35 g/dL (ref 30.0–36.0)
MCV: 95.4 fL (ref 80.0–100.0)
Monocytes Absolute: 0.3 10*3/uL (ref 0.1–1.0)
Monocytes Relative: 7 %
Neutro Abs: 2.9 10*3/uL (ref 1.7–7.7)
Neutrophils Relative %: 66 %
Platelet Count: 289 10*3/uL (ref 150–400)
RBC: 3.45 MIL/uL — ABNORMAL LOW (ref 3.87–5.11)
RDW: 12.8 % (ref 11.5–15.5)
WBC Count: 4.5 10*3/uL (ref 4.0–10.5)
nRBC: 0 % (ref 0.0–0.2)

## 2022-02-25 LAB — CMP (CANCER CENTER ONLY)
ALT: 10 U/L (ref 0–44)
AST: 16 U/L (ref 15–41)
Albumin: 4.5 g/dL (ref 3.5–5.0)
Alkaline Phosphatase: 30 U/L — ABNORMAL LOW (ref 38–126)
Anion gap: 6 (ref 5–15)
BUN: 11 mg/dL (ref 6–20)
CO2: 27 mmol/L (ref 22–32)
Calcium: 9.7 mg/dL (ref 8.9–10.3)
Chloride: 106 mmol/L (ref 98–111)
Creatinine: 0.61 mg/dL (ref 0.44–1.00)
GFR, Estimated: >60 mL/min (ref >60)
Glucose, Bld: 103 mg/dL — ABNORMAL HIGH (ref 70–99)
Potassium: 3.8 mmol/L (ref 3.5–5.1)
Sodium: 139 mmol/L (ref 135–145)
Total Bilirubin: 0.4 mg/dL (ref 0.3–1.2)
Total Protein: 7.1 g/dL (ref 6.5–8.1)

## 2022-02-25 LAB — PREGNANCY, URINE: Preg Test, Ur: NEGATIVE

## 2022-02-25 MED ORDER — SODIUM CHLORIDE 0.9% FLUSH
10.0000 mL | INTRAVENOUS | Status: DC | PRN
  Administered 2022-02-25: 10 mL

## 2022-02-25 MED ORDER — HEPARIN SOD (PORK) LOCK FLUSH 100 UNIT/ML IV SOLN
500.0000 [IU] | Freq: Once | INTRAVENOUS | Status: AC | PRN
  Administered 2022-02-25: 500 [IU]

## 2022-02-25 MED ORDER — DIPHENHYDRAMINE HCL 50 MG/ML IJ SOLN
50.0000 mg | Freq: Once | INTRAMUSCULAR | Status: AC
  Administered 2022-02-25: 50 mg via INTRAVENOUS
  Filled 2022-02-25: qty 1

## 2022-02-25 MED ORDER — SODIUM CHLORIDE 0.9 % IV SOLN
10.0000 mg | Freq: Once | INTRAVENOUS | Status: AC
  Administered 2022-02-25: 10 mg via INTRAVENOUS
  Filled 2022-02-25: qty 10

## 2022-02-25 MED ORDER — FAMOTIDINE IN NACL 20-0.9 MG/50ML-% IV SOLN
20.0000 mg | Freq: Once | INTRAVENOUS | Status: AC
  Administered 2022-02-25: 20 mg via INTRAVENOUS
  Filled 2022-02-25: qty 50

## 2022-02-25 MED ORDER — SODIUM CHLORIDE 0.9 % IV SOLN
80.0000 mg/m2 | Freq: Once | INTRAVENOUS | Status: AC
  Administered 2022-02-25: 138 mg via INTRAVENOUS
  Filled 2022-02-25: qty 23

## 2022-02-25 MED ORDER — SODIUM CHLORIDE 0.9 % IV SOLN: Freq: Once | INTRAVENOUS | Status: AC

## 2022-02-25 MED ORDER — SODIUM CHLORIDE 0.9% FLUSH
10.0000 mL | Freq: Once | INTRAVENOUS | Status: AC
  Administered 2022-02-25: 10 mL

## 2022-02-25 NOTE — Progress Notes (Signed)
Ok to proceed without urine pregnancy resulted out. ? ?T.O. Dr Jannifer Hick, PharmD ?

## 2022-02-25 NOTE — Patient Instructions (Signed)
[facility]  Discharge Instructions: ?Thank you for choosing [facility] to provide your oncology and hematology care.  ? ?If you have a lab appointment with the [facility], please go directly to the [facility] and check in at the registration area. ?  ?Wear comfortable clothing and clothing appropriate for easy access to any Portacath or PICC line.  ? ?We strive to give you quality time with your provider. You may need to reschedule your appointment if you arrive late (15 or more minutes).  Arriving late affects you and other patients whose appointments are after yours.  Also, if you miss three or more appointments without notifying the office, you may be dismissed from the clinic at the provider?s discretion.    ?  ?For prescription refill requests, have your pharmacy contact our office and allow 72 hours for refills to be completed.   ? ?Today you received the following chemotherapy and/or immunotherapy agents: Taxol    ?  ?To help prevent nausea and vomiting after your treatment, we encourage you to take your nausea medication as directed. ? ?BELOW ARE SYMPTOMS THAT SHOULD BE REPORTED IMMEDIATELY: ?*FEVER GREATER THAN 100.4 F (38 ?C) OR HIGHER ?*CHILLS OR SWEATING ?*NAUSEA AND VOMITING THAT IS NOT CONTROLLED WITH YOUR NAUSEA MEDICATION ?*UNUSUAL SHORTNESS OF BREATH ?*UNUSUAL BRUISING OR BLEEDING ?*URINARY PROBLEMS (pain or burning when urinating, or frequent urination) ?*BOWEL PROBLEMS (unusual diarrhea, constipation, pain near the anus) ?TENDERNESS IN MOUTH AND THROAT WITH OR WITHOUT PRESENCE OF ULCERS (sore throat, sores in mouth, or a toothache) ?UNUSUAL RASH, SWELLING OR PAIN  ?UNUSUAL VAGINAL DISCHARGE OR ITCHING  ? ?Items with * indicate a potential emergency and should be followed up as soon as possible or go to the Emergency Department if any problems should occur. ? ?Please show the CHEMOTHERAPY ALERT CARD or IMMUNOTHERAPY ALERT CARD at check-in to the  Emergency Department and triage nurse. ? ?Should you have questions after your visit or need to cancel or reschedule your appointment, please contact [facility]  Dept: [phone]  and follow the prompts.  Office hours are 8:00 a.m. to 4:30 p.m. Monday - Friday. Please note that voicemails left after 4:00 p.m. may not be returned until the following business day.  We are closed weekends and major holidays. You have access to a nurse at all times for urgent questions. Please call the main number to the clinic Dept: [phone] and follow the prompts. ? ? ?For any non-urgent questions, you may also contact your provider using MyChart. We now offer e-Visits for anyone 47 and older to request care online for non-urgent symptoms. For details visit [email or web address]. ?  ?Also download the MyChart app! Go to the app store, search "MyChart", open the app, select [facility], and log in with your MyChart username and password. ? ?Due to Covid, a mask is required upon entering the hospital/clinic. If you do not have a mask, one will be given to you upon arrival. For doctor visits, patients may have 1 support person aged 72 or older with them. For treatment visits, patients cannot have anyone with them due to current Covid guidelines and our immunocompromised population.  ? ?

## 2022-03-03 MED FILL — Dexamethasone Sodium Phosphate Inj 100 MG/10ML: INTRAMUSCULAR | Qty: 1 | Status: AC

## 2022-03-04 ENCOUNTER — Encounter: Payer: Self-pay | Admitting: Hematology and Oncology

## 2022-03-04 ENCOUNTER — Other Ambulatory Visit: Payer: Self-pay

## 2022-03-04 ENCOUNTER — Inpatient Hospital Stay: Payer: Medicaid Other

## 2022-03-04 ENCOUNTER — Encounter: Payer: Self-pay | Admitting: Adult Health

## 2022-03-04 ENCOUNTER — Inpatient Hospital Stay (HOSPITAL_BASED_OUTPATIENT_CLINIC_OR_DEPARTMENT_OTHER): Payer: Medicaid Other | Admitting: Adult Health

## 2022-03-04 VITALS — BP 109/67 | HR 99 | Temp 97.9°F | Resp 18 | Ht 66.0 in | Wt 141.9 lb

## 2022-03-04 DIAGNOSIS — Z170 Estrogen receptor positive status [ER+]: Secondary | ICD-10-CM | POA: Diagnosis not present

## 2022-03-04 DIAGNOSIS — Z95828 Presence of other vascular implants and grafts: Secondary | ICD-10-CM

## 2022-03-04 DIAGNOSIS — C50411 Malignant neoplasm of upper-outer quadrant of right female breast: Secondary | ICD-10-CM | POA: Diagnosis not present

## 2022-03-04 DIAGNOSIS — Z5111 Encounter for antineoplastic chemotherapy: Secondary | ICD-10-CM | POA: Diagnosis not present

## 2022-03-04 LAB — CBC WITH DIFFERENTIAL (CANCER CENTER ONLY)
Abs Immature Granulocytes: 0.05 10*3/uL (ref 0.00–0.07)
Basophils Absolute: 0 10*3/uL (ref 0.0–0.1)
Basophils Relative: 1 %
Eosinophils Absolute: 0.1 10*3/uL (ref 0.0–0.5)
Eosinophils Relative: 2 %
HCT: 32.7 % — ABNORMAL LOW (ref 36.0–46.0)
Hemoglobin: 11.7 g/dL — ABNORMAL LOW (ref 12.0–15.0)
Immature Granulocytes: 1 %
Lymphocytes Relative: 20 %
Lymphs Abs: 1.1 10*3/uL (ref 0.7–4.0)
MCH: 34.3 pg — ABNORMAL HIGH (ref 26.0–34.0)
MCHC: 35.8 g/dL (ref 30.0–36.0)
MCV: 95.9 fL (ref 80.0–100.0)
Monocytes Absolute: 0.3 10*3/uL (ref 0.1–1.0)
Monocytes Relative: 6 %
Neutro Abs: 3.8 10*3/uL (ref 1.7–7.7)
Neutrophils Relative %: 70 %
Platelet Count: 297 10*3/uL (ref 150–400)
RBC: 3.41 MIL/uL — ABNORMAL LOW (ref 3.87–5.11)
RDW: 13 % (ref 11.5–15.5)
WBC Count: 5.4 10*3/uL (ref 4.0–10.5)
nRBC: 0 % (ref 0.0–0.2)

## 2022-03-04 LAB — CMP (CANCER CENTER ONLY)
ALT: 8 U/L (ref 0–44)
AST: 15 U/L (ref 15–41)
Albumin: 4.5 g/dL (ref 3.5–5.0)
Alkaline Phosphatase: 35 U/L — ABNORMAL LOW (ref 38–126)
Anion gap: 8 (ref 5–15)
BUN: 8 mg/dL (ref 6–20)
CO2: 26 mmol/L (ref 22–32)
Calcium: 9.7 mg/dL (ref 8.9–10.3)
Chloride: 106 mmol/L (ref 98–111)
Creatinine: 0.59 mg/dL (ref 0.44–1.00)
GFR, Estimated: >60 mL/min (ref >60)
Glucose, Bld: 108 mg/dL — ABNORMAL HIGH (ref 70–99)
Potassium: 3.9 mmol/L (ref 3.5–5.1)
Sodium: 140 mmol/L (ref 135–145)
Total Bilirubin: 0.3 mg/dL (ref 0.3–1.2)
Total Protein: 7 g/dL (ref 6.5–8.1)

## 2022-03-04 LAB — PREGNANCY, URINE: Preg Test, Ur: NEGATIVE

## 2022-03-04 MED ORDER — SODIUM CHLORIDE 0.9 % IV SOLN
65.0000 mg/m2 | Freq: Once | INTRAVENOUS | Status: AC
  Administered 2022-03-04: 114 mg via INTRAVENOUS
  Filled 2022-03-04: qty 19

## 2022-03-04 MED ORDER — SODIUM CHLORIDE 0.9% FLUSH
10.0000 mL | Freq: Once | INTRAVENOUS | Status: AC
  Administered 2022-03-04: 10 mL

## 2022-03-04 MED ORDER — SODIUM CHLORIDE 0.9% FLUSH
10.0000 mL | INTRAVENOUS | Status: DC | PRN
  Administered 2022-03-04: 10 mL

## 2022-03-04 MED ORDER — DIPHENHYDRAMINE HCL 50 MG/ML IJ SOLN
50.0000 mg | Freq: Once | INTRAMUSCULAR | Status: AC
  Administered 2022-03-04: 50 mg via INTRAVENOUS
  Filled 2022-03-04: qty 1

## 2022-03-04 MED ORDER — HEPARIN SOD (PORK) LOCK FLUSH 100 UNIT/ML IV SOLN
500.0000 [IU] | Freq: Once | INTRAVENOUS | Status: AC | PRN
  Administered 2022-03-04: 500 [IU]

## 2022-03-04 MED ORDER — SODIUM CHLORIDE 0.9 % IV SOLN: Freq: Once | INTRAVENOUS | Status: AC

## 2022-03-04 MED ORDER — SODIUM CHLORIDE 0.9 % IV SOLN
10.0000 mg | Freq: Once | INTRAVENOUS | Status: AC
  Administered 2022-03-04: 10 mg via INTRAVENOUS
  Filled 2022-03-04: qty 10

## 2022-03-04 MED ORDER — FAMOTIDINE IN NACL 20-0.9 MG/50ML-% IV SOLN
20.0000 mg | Freq: Once | INTRAVENOUS | Status: AC
  Administered 2022-03-04: 20 mg via INTRAVENOUS
  Filled 2022-03-04: qty 50

## 2022-03-04 NOTE — Assessment & Plan Note (Signed)
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,FISH negative  Treatment plan: 1.Neoadjuvant chemotherapy with dose dense Adriamycin and Cytoxan followed by Taxol weekly x12  2.Mastectomyversus lumpectomy with targeted node dissection 3.Adjuvant radiation 4.Followed by adjuvant antiestrogen therapy (ovarian suppression) along with CDK 4 and 6 inhibitor 5.Genetic testing -------------------------------------------------------------------------------------------------------------  Current Treatment: Completed 4 cycles of dose dense Adriamycin and Cytoxan started 10/28/2018, today cycle 6 Taxol  CTchest angio: No PE, no mets ED visit 11/05/21: Severe back pain :CT Angio: No mets Bone scan 1/20/23Bone scan: No mets MRI guided biopsies1/12/23: Left breast bx: Benign  Chemo Toxicities: Grade 1 peripheral neuropathy: We have dose reduced her treatment to 65 mg/m.  I reviewed this with Dr. Lindi Adie who is in agreement.  Because she does have a early grade 1 neuropathy and went ahead and ordered her MRI so that we could get this scheduled in case she has to in treatment early.  RTC weekly for chemo and every other week for follow-up with myself or Dr. Lindi Adie.

## 2022-03-04 NOTE — Progress Notes (Signed)
Monica Monica [name] Follow up:    Default, Provider, MD No address on file   DIAGNOSIS:  Monica [name] Staging  Malignant neoplasm of upper-outer quadrant of right breast in female, estrogen receptor positive ([place]) Staging form: Breast, AJCC 8th Edition - Clinical stage from 10/16/2021: Stage IIA (cT3, cN1, cM0, G2, ER+, PR+, HER2: Equivocal) - Signed by [name], MD on 10/16/2021 Histologic grading system: 3 grade system   SUMMARY OF ONCOLOGIC HISTORY: Oncology History  Malignant neoplasm of upper-outer quadrant of right breast in female, estrogen receptor positive ([place])  10/16/2021 Initial Diagnosis   Malignant neoplasm of upper-outer quadrant of right breast in female, estrogen receptor positive ([place])    10/16/2021 Monica [name] Staging   Staging form: Breast, AJCC 8th Edition - Clinical stage from 10/16/2021: Stage IIA (cT3, cN1, cM0, G2, ER+, PR+, HER2: Equivocal) - Signed by [name], MD on 10/16/2021 Histologic grading system: 3 grade system    10/28/2021 -  Chemotherapy   Patient is on Treatment Plan : BREAST ADJUVANT DOSE DENSE AC q14d / PACLitaxel q7d       Genetic Testing   [facility] CancerNext-Expanded identified 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 Report date is 01/21/2022.  The CancerNext-Expanded gene panel offered by [employer] and includes sequencing, rearrangement, and RNA analysis for the following 77 genes: AIP, ALK, APC, ATM, AXIN2, BAP1, BARD1, BLM, BMPR1A, BRCA1, BRCA2, BRIP1, CDC73, CDH1, CDK4, CDKN1B, CDKN2A, CHEK2, CTNNA1, DICER1, FANCC, FH, FLCN, GALNT12, KIF1B, LZTR1, MAX, MEN1, MET, MLH1, MSH2, MSH3, MSH6, MUTYH, NBN, NF1, NF2, NTHL1, PALB2, PHOX2B, PMS2, POT1, PRKAR1A, PTCH1, PTEN, RAD51C, RAD51D, RB1, RECQL, RET, SDHA, SDHAF2, SDHB, SDHC, SDHD, SMAD4, SMARCA4, SMARCB1, SMARCE1, STK11, SUFU, TMEM127, TP53, TSC1, TSC2, VHL and XRCC2 (sequencing and deletion/duplication); EGFR, EGLN1, HOXB13, KIT, MITF, PDGFRA, POLD1, and POLE  (sequencing only); EPCAM and GREM1 (deletion/duplication only).      CURRENT THERAPY: Weekly Taxol  INTERVAL HISTORY: Monica Monica [name] 26 y.o. female returns for follow-up to receiving her weekly Taxol.  Today she is due for #8.  This is being given neoadjuvantly.  She has very early peripheral neuropathy and this is mild in her first and second digit and the very tip.  She describes it as a dysesthesia.  She has an occasional paresthesia but this is intermittent.  She denies any motor changes.  And there are no symptoms in digits 3 4 and 5 of her fingertips bilaterally.  She also has very early paresthesia intermittently in her great toe bilaterally.  There are no motor changes in her feet as well.  She tells me that she can no longer feel her breast Monica [name] and she is doing well otherwise.   Patient Active Problem List   Diagnosis Date Noted   BRCA2 gene mutation positive 01/14/2022   Genetic testing 01/14/2022   Port-A-Cath in place 11/04/2021   Malignant neoplasm of upper-outer quadrant of right breast in female, estrogen receptor positive ([place]) 10/16/2021    has No Known Allergies.  MEDICAL HISTORY: Past Medical History:  Diagnosis Date   Abscess    Monica [name] ([place]) 10/16/2021   right breast Monica [name]    SURGICAL HISTORY: Past Surgical History:  Procedure Laterality Date   PORTACATH PLACEMENT Left 10/27/2021   Procedure: INSERTION PORT-A-CATH;  Surgeon: Stark Klein, MD;  Location: [facility];  Service: General;  Laterality: Left;    SOCIAL HISTORY: Social History   Socioeconomic History   Marital status: Married    Spouse name: Not on file   Number  of children: Not on file   Years of education: Not on file   Highest education level: Not on file  Occupational History   Not on file  Tobacco Use   Smoking status: Never   Smokeless tobacco: Never  Vaping Use   Vaping Use: Never used  Substance and Sexual Activity   Alcohol use: No   Drug use: Never   Sexual activity: Yes     Birth control/protection: Other-see comments    Comment: Depo Shot  Other Topics Concern   Not on file  Social History Narrative   Not on file   Social Determinants of Health   Financial Resource Strain: Not on file  Food Insecurity: Not on file  Transportation Needs: Not on file  Physical Activity: Not on file  Stress: Not on file  Social Connections: Not on file  Intimate Partner Violence: Not on file    FAMILY HISTORY: Family History  Problem Relation Age of Onset   Breast Monica [name] Maternal Grandmother 40    Review of Systems  Constitutional:  Positive for fatigue (Mild). Negative for appetite change, chills, fever and unexpected weight change.  HENT:   Negative for hearing loss, lump/mass and trouble swallowing.   Eyes:  Negative for eye problems and icterus.  Respiratory:  Negative for chest tightness, cough and shortness of breath.   Cardiovascular:  Negative for chest pain, leg swelling and palpitations.  Gastrointestinal:  Negative for abdominal distention, abdominal pain, constipation, diarrhea, nausea and vomiting.  Endocrine: Negative for hot flashes.  Genitourinary:  Negative for difficulty urinating.   Musculoskeletal:  Negative for arthralgias.  Skin:  Negative for itching and rash.  Neurological:  Negative for dizziness, extremity weakness, headaches and numbness.  Hematological:  Negative for adenopathy. Does not bruise/bleed easily.  Psychiatric/Behavioral:  Negative for depression. The patient is not nervous/anxious.      PHYSICAL EXAMINATION  ECOG PERFORMANCE STATUS: 1 - Symptomatic but completely ambulatory  Vitals:   03/04/22 0928  BP: 109/67  Pulse: 99  Resp: 18  Temp: 97.9 F (36.6 C)  SpO2: 100%    Physical Exam Constitutional:      General: She is not in acute distress.    Appearance: Normal appearance. She is not toxic-appearing.  HENT:     Head: Normocephalic and atraumatic.  Eyes:     General: No scleral icterus. Cardiovascular:      Rate and Rhythm: Normal rate and regular rhythm.     Pulses: Normal pulses.     Heart sounds: Normal heart sounds.  Pulmonary:     Effort: Pulmonary effort is normal.     Breath sounds: Normal breath sounds.  Abdominal:     General: Abdomen is flat. Bowel sounds are normal. There is no distension.     Palpations: Abdomen is soft.     Tenderness: There is no abdominal tenderness.  Musculoskeletal:        General: No swelling.     Cervical back: Neck supple.  Lymphadenopathy:     Cervical: No cervical adenopathy.  Skin:    General: Skin is warm and dry.     Findings: No rash.  Neurological:     General: No focal deficit present.     Mental Status: She is alert.  Psychiatric:        Mood and Affect: Mood normal.        Behavior: Behavior normal.    LABORATORY DATA:  CBC    Component Value Date/Time   WBC 5.4  03/04/2022 0912   WBC 3.5 (L) 11/05/2021 1053   RBC 3.41 (L) 03/04/2022 0912   HGB 11.7 (L) 03/04/2022 0912   HCT 32.7 (L) 03/04/2022 0912   PLT 297 03/04/2022 0912   MCV 95.9 03/04/2022 0912   MCH 34.3 (H) 03/04/2022 0912   MCHC 35.8 03/04/2022 0912   RDW 13.0 03/04/2022 0912   LYMPHSABS 1.1 03/04/2022 0912   MONOABS 0.3 03/04/2022 0912   EOSABS 0.1 03/04/2022 0912   BASOSABS 0.0 03/04/2022 0912    CMP     Component Value Date/Time   NA 140 03/04/2022 0912   K 3.9 03/04/2022 0912   CL 106 03/04/2022 0912   CO2 26 03/04/2022 0912   GLUCOSE 108 (H) 03/04/2022 0912   BUN 8 03/04/2022 0912   CREATININE 0.59 03/04/2022 0912   CALCIUM 9.7 03/04/2022 0912   PROT 7.0 03/04/2022 0912   ALBUMIN 4.5 03/04/2022 0912   AST 15 03/04/2022 0912   ALT 8 03/04/2022 0912   ALKPHOS 35 (L) 03/04/2022 0912   BILITOT 0.3 03/04/2022 0912   GFRNONAA >60 03/04/2022 0912       ASSESSMENT and THERAPY PLAN:   Malignant neoplasm of upper-outer quadrant of right breast in female, estrogen receptor positive ([place]) 10/08/2021: Large palpable lump in the right breast with  deformity of the nipple, mammogram and ultrasound revealed 5.1 cm mass central right breast, additional lesion 7 mm and 8 mm, suspicious, morphologically normal intramammary lymph node at 10:00, single right axillary lymph node (both were positive for IDC), 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  Neoadjuvant chemotherapy with dose dense Adriamycin and Cytoxan followed by Taxol weekly x12   2. Mastectomy versus lumpectomy with targeted node dissection 3.  Adjuvant radiation 4.  Followed by adjuvant antiestrogen therapy (ovarian suppression) along with CDK 4 and 6 inhibitor 5.  Genetic testing -------------------------------------------------------------------------------------------------------------   Current Treatment: Completed 4 cycles of dose dense Adriamycin and Cytoxan started 10/28/2018, today cycle 6 Taxol  CT chest angio: No PE, no mets ED visit 11/05/21: Severe back pain :CT Angio: No mets Bone scan 11/06/21 Bone scan: No mets MRI guided biopsies 10/29/21: Left breast bx: Benign    Chemo Toxicities: Grade 1 peripheral neuropathy: We have dose reduced her treatment to 65 mg/m.  I reviewed this with Dr. Lindi Adie who is in agreement.  Because she does have a early grade 1 neuropathy and went ahead and ordered her MRI so that we could get this scheduled in case she has to in treatment early.   RTC weekly for chemo and every other week for follow-up with myself or Dr. Lindi Adie.   All questions were answered. The patient knows to call the clinic with any problems, questions or concerns. We can certainly see the patient much sooner if necessary.  Total encounter time:20 minutes*in face-to-face visit time, chart review, lab review, care coordination, order entry, and documentation of the encounter time.    Wilber Bihari, NP 03/04/22 10:35 AM Medical Oncology and Hematology [facility] [place], [place] [address] Tel. [phone]    Fax. [phone]  *Total Encounter Time as defined by the Centers for Medicare and Medicaid Services includes, in addition to the face-to-face time of a patient visit (documented in the note above) non-face-to-face time: obtaining and reviewing outside history, ordering and reviewing medications, tests or procedures, care coordination (communications with other health care  professionals or caregivers) and documentation in the medical record.

## 2022-03-04 NOTE — Patient Instructions (Signed)
[facility]  Discharge Instructions: Thank you for choosing [facility] to provide your oncology and hematology care.   If you have a lab appointment with the [facility], please go directly to the [facility] and check in at the registration area.   Wear comfortable clothing and clothing appropriate for easy access to any Portacath or PICC line.   We strive to give you quality time with your provider. You may need to reschedule your appointment if you arrive late (15 or more minutes).  Arriving late affects you and other patients whose appointments are after yours.  Also, if you miss three or more appointments without notifying the office, you may be dismissed from the clinic at the provider's discretion.      For prescription refill requests, have your pharmacy contact our office and allow 72 hours for refills to be completed.    Today you received the following chemotherapy and/or immunotherapy agents: Paclitaxel.       To help prevent nausea and vomiting after your treatment, we encourage you to take your nausea medication as directed.  BELOW ARE SYMPTOMS THAT SHOULD BE REPORTED IMMEDIATELY: *FEVER GREATER THAN 100.4 F (38 C) OR HIGHER *CHILLS OR SWEATING *NAUSEA AND VOMITING THAT IS NOT CONTROLLED WITH YOUR NAUSEA MEDICATION *UNUSUAL SHORTNESS OF BREATH *UNUSUAL BRUISING OR BLEEDING *URINARY PROBLEMS (pain or burning when urinating, or frequent urination) *BOWEL PROBLEMS (unusual diarrhea, constipation, pain near the anus) TENDERNESS IN MOUTH AND THROAT WITH OR WITHOUT PRESENCE OF ULCERS (sore throat, sores in mouth, or a toothache) UNUSUAL RASH, SWELLING OR PAIN  UNUSUAL VAGINAL DISCHARGE OR ITCHING   Items with * indicate a potential emergency and should be followed up as soon as possible or go to the Emergency Department if any problems should occur.  Please show the CHEMOTHERAPY ALERT CARD or IMMUNOTHERAPY ALERT CARD at check-in  to the Emergency Department and triage nurse.  Should you have questions after your visit or need to cancel or reschedule your appointment, please contact [facility]  Dept: [phone]  and follow the prompts.  Office hours are 8:00 a.m. to 4:30 p.m. Monday - Friday. Please note that voicemails left after 4:00 p.m. may not be returned until the following business day.  We are closed weekends and major holidays. You have access to a nurse at all times for urgent questions. Please call the main number to the clinic Dept: [phone] and follow the prompts.   For any non-urgent questions, you may also contact your provider using MyChart. We now offer e-Visits for anyone 10 and older to request care online for non-urgent symptoms. For details visit [email or web address].   Also download the MyChart app! Go to the app store, search "MyChart", open the app, select [facility], and log in with your MyChart username and password.  Due to Covid, a mask is required upon entering the hospital/clinic. If you do not have a mask, one will be given to you upon arrival. For doctor visits, patients may have 1 support person aged 8 or older with them. For treatment visits, patients cannot have anyone with them due to current Covid guidelines and our immunocompromised population.

## 2022-03-09 ENCOUNTER — Ambulatory Visit (HOSPITAL_COMMUNITY)
Admission: RE | Admit: 2022-03-09 | Discharge: 2022-03-09 | Disposition: A | Discharge status: Home / Self Care | Payer: Medicaid Other | Source: Ambulatory Visit | Attending: Adult Health | Admitting: Adult Health

## 2022-03-09 DIAGNOSIS — C50411 Malignant neoplasm of upper-outer quadrant of right female breast: Secondary | ICD-10-CM | POA: Diagnosis present

## 2022-03-09 DIAGNOSIS — Z170 Estrogen receptor positive status [ER+]: Secondary | ICD-10-CM | POA: Diagnosis present

## 2022-03-09 IMAGING — MR MR BREAST BILAT WO/W CM
6 of 9 series · 28 of 48 positions shown · IV contrast (gadavist)
Comparison: Previous examinations, including the bilateral breast
MRI dated [DATE] and right breast MR guided core needle biopsy
dated [DATE].

CLINICAL DATA: Ongoing chemotherapy for right breast cancer.

EXAM:
BILATERAL BREAST MRI WITH AND WITHOUT CONTRAST
TECHNIQUE: Multiplanar, multisequence MR images of both breasts were obtained
prior to and following the intravenous administration of 6 ml of
Gadavist

[Series 2: T2 · axial · 3.0mm · 0.91mm/px · 1 of 51 slices shown]
[im 1/51]
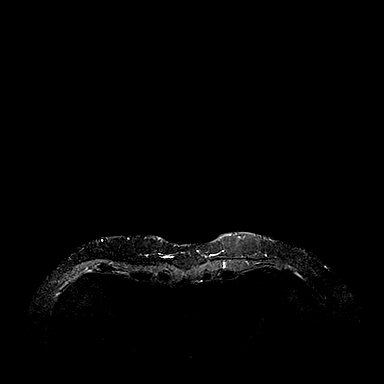

[Series 3: T1 fat-sat · axial · 1.2mm · 0.78mm/px · z∈[-86,+86]mm · 7 of 144 slices shown (1 of 4)]
[im 1/144]
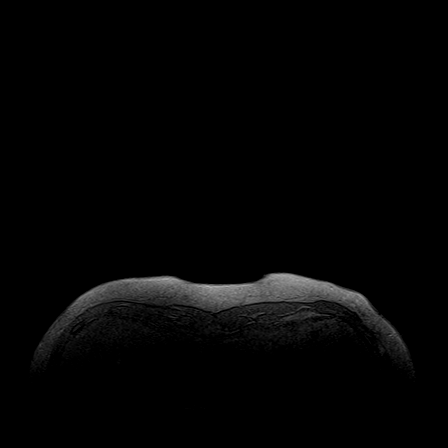
[im 24/144]
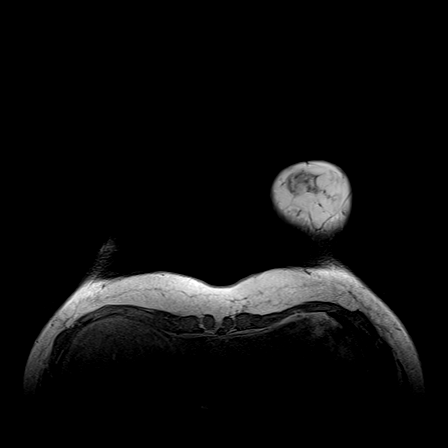
[im 48/144]
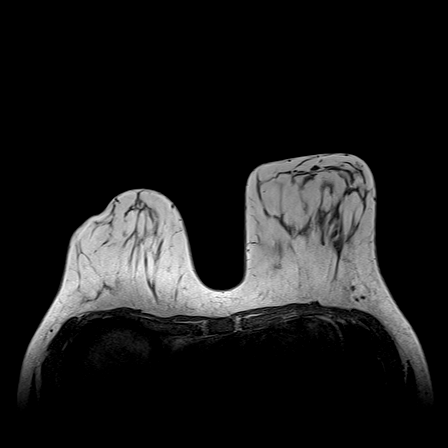
[im 72/144]
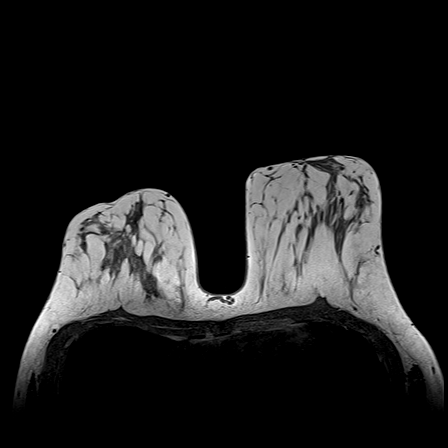
[im 96/144]
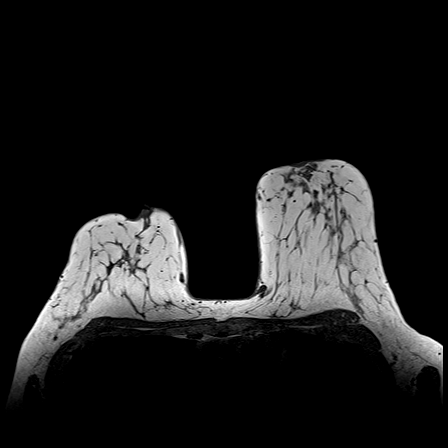
[im 120/144]
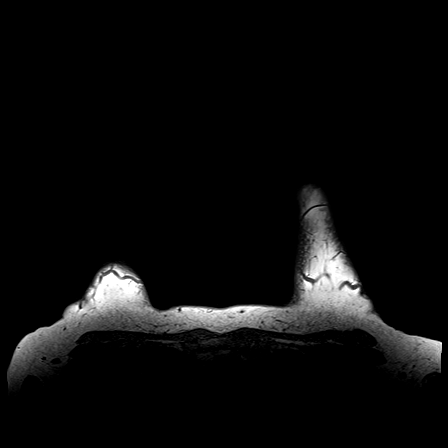
[im 144/144]
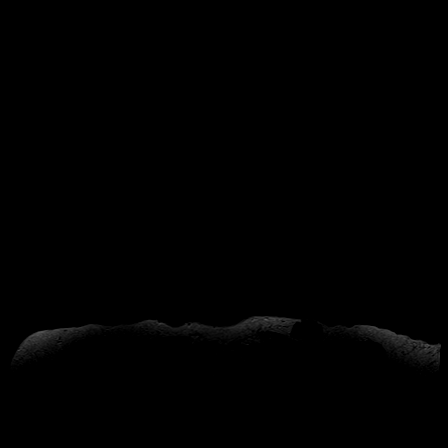

[Series 4: T1 fat-sat · axial · 1.2mm · 0.84mm/px · z∈[-71,+71]mm · 6 of 118 slices shown (2 of 4)]
[im 1/118]
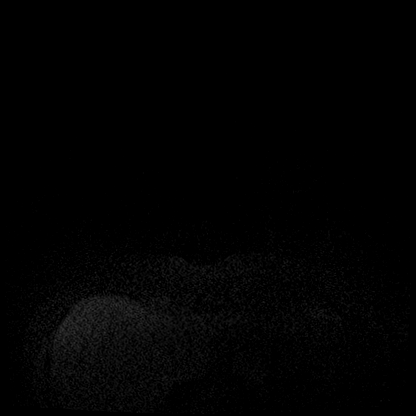
[im 24/118]
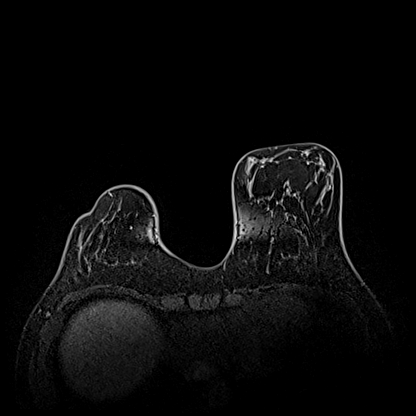
[im 47/118]
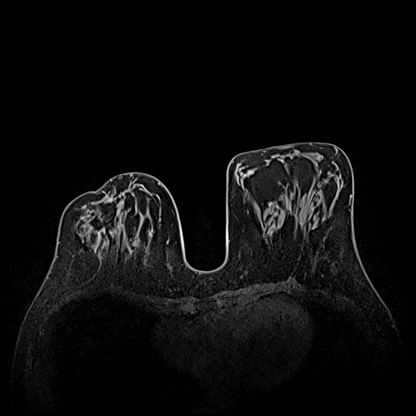
[im 71/118]
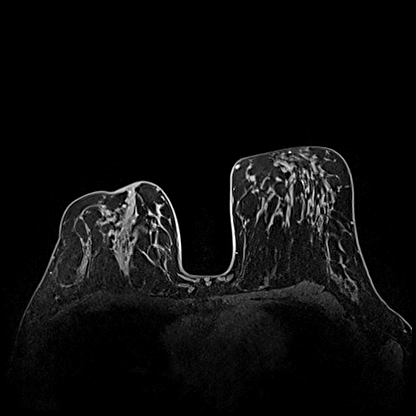
[im 94/118]
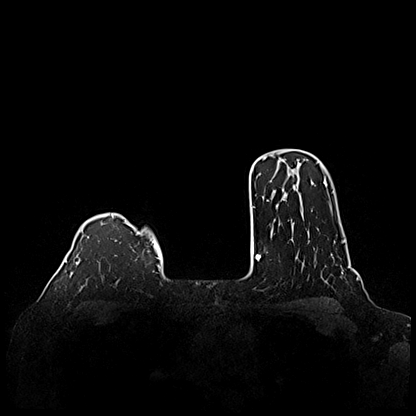
[im 118/118]
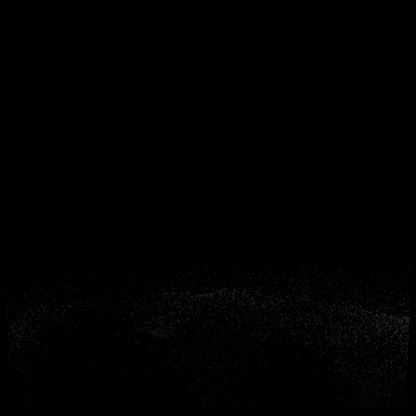

[Series 5: T1 fat-sat · axial · 1.2mm · 0.84mm/px · z∈[-71,+71]mm · 6 of 118 slices shown (3 of 4)]
[im 1/118]
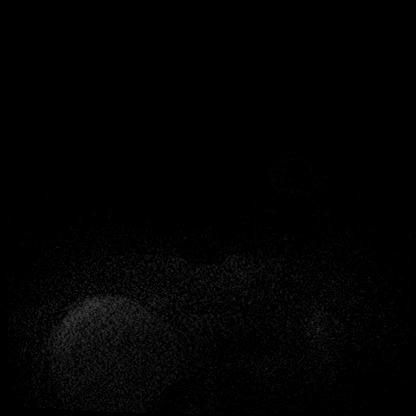
[im 24/118]
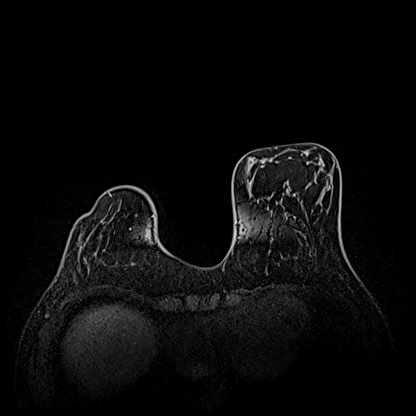
[im 47/118]
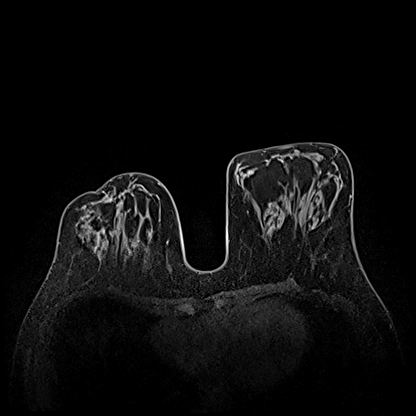
[im 71/118]
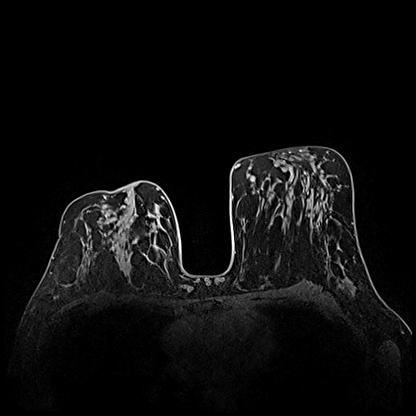
[im 94/118]
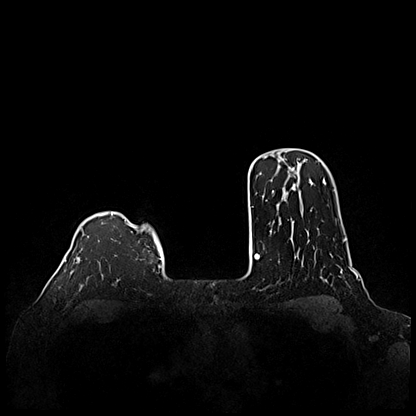
[im 118/118]
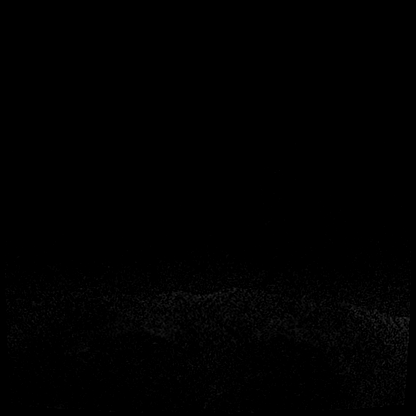

[Series 6: T1 fat-sat · axial · 1.2mm · 0.84mm/px · z∈[-71,+71]mm · 6 of 118 slices shown (4 of 4)]
[im 1/118]
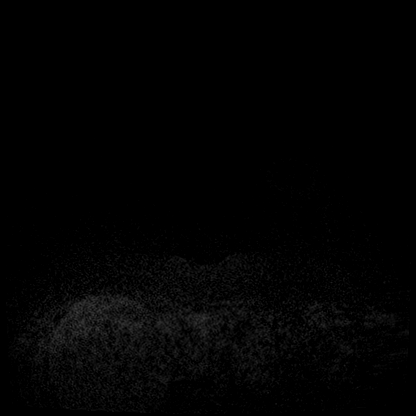
[im 24/118]
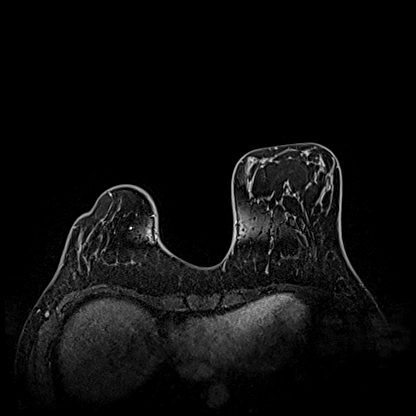
[im 47/118]
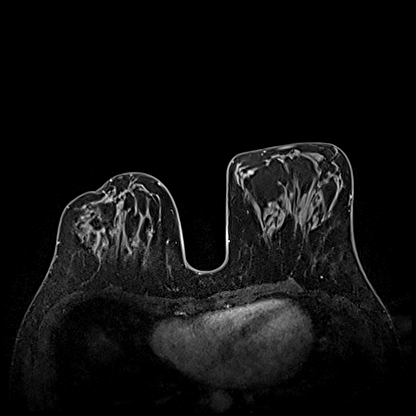
[im 71/118]
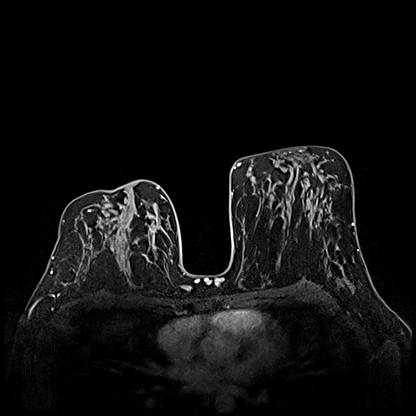
[im 94/118]
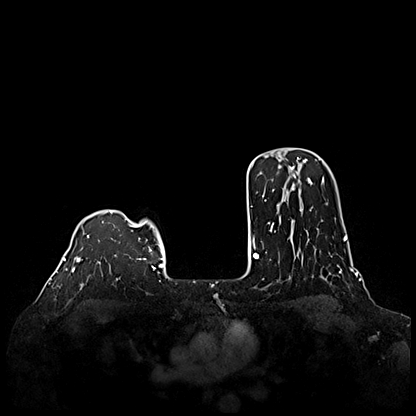
[im 118/118]
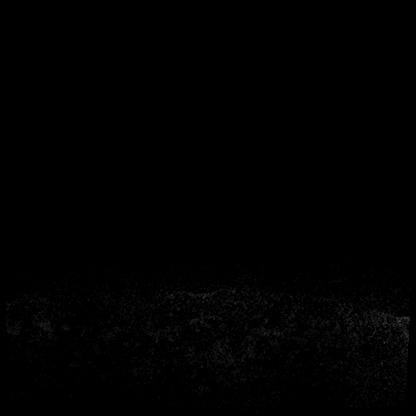

[Series 7: T1 · axial · 1.2mm · 0.84mm/px · z∈[-71,-44]mm · 2 of 120 slices shown]
[im 1/120]
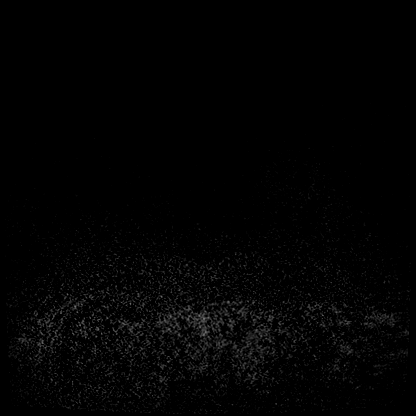
[im 24/120]
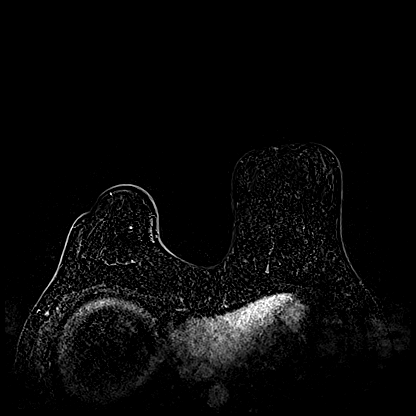

[28 of 48 positions shown; findings below may reference images not displayed]

Three-dimensional MR images were rendered by post-processing of the
original MR data on an independent workstation. The
three-dimensional MR images were interpreted, and findings are
reported in the following complete MRI report for this study. Three
dimensional images were evaluated at the independent interpreting
workstation using the DynaCAD thin client.
FINDINGS: Breast composition: c. Heterogeneous fibroglandular tissue.

Background parenchymal enhancement: Mild.

Right breast: Marked decrease in amount of enhancement within the
previously demonstrated extensive area of non mass enhancement in
the right breast without significant change in size or distribution.
The right breast remains smaller than the left breast due to
contraction with interval anterior skin indentation laterally.

The previously biopsied intramammary lymph node in the posterior
upper outer right breast is no longer enlarged. There is also been a
significant interval decrease in size of multiple additional
previously demonstrated mildly prominent right axillary lymph nodes.

Left breast: No mass or abnormal enhancement.

Lymph nodes: Marked decrease in size of previously demonstrated
multiple mildly prominent right axillary lymph nodes and previously
biopsied metastatic node in the axillary tail region of the right
breast. There has been a significant decrease in size of 5 right
axillary lymph nodes. No enlarged lymph nodes are seen today.

Ancillary findings:  None.
IMPRESSION: 1. No significant change in size of the previously measured large
area of non mass enhancement in the right breast with a significant
interval decrease in amount of enhancement.
2. The right breast remains smaller than the left breast with an
interval anterior skin indentation laterally.
3. Resolved right intramammary and axillary metastatic adenopathy
with a significant decrease in size of 5 right axillary lymph nodes.
4. No evidence of malignancy on the left.

RECOMMENDATION:
Treatment plan.

BI-RADS CATEGORY  6: Known biopsy-proven malignancy.

## 2022-03-09 MED ORDER — GADOBUTROL 1 MMOL/ML IV SOLN
6.0000 mL | Freq: Once | INTRAVENOUS | Status: AC | PRN
  Administered 2022-03-09: 6 mL via INTRAVENOUS

## 2022-03-10 ENCOUNTER — Ambulatory Visit (HOSPITAL_COMMUNITY): Payer: Medicaid Other

## 2022-03-10 MED FILL — Dexamethasone Sodium Phosphate Inj 100 MG/10ML: INTRAMUSCULAR | Qty: 1 | Status: AC

## 2022-03-11 ENCOUNTER — Inpatient Hospital Stay: Payer: Medicaid Other

## 2022-03-11 ENCOUNTER — Other Ambulatory Visit: Payer: Self-pay

## 2022-03-11 ENCOUNTER — Other Ambulatory Visit: Payer: Self-pay | Admitting: Hematology and Oncology

## 2022-03-11 VITALS — BP 102/71 | HR 95 | Temp 98.6°F | Resp 18 | Wt 141.5 lb

## 2022-03-11 DIAGNOSIS — Z5111 Encounter for antineoplastic chemotherapy: Secondary | ICD-10-CM | POA: Diagnosis not present

## 2022-03-11 DIAGNOSIS — C50411 Malignant neoplasm of upper-outer quadrant of right female breast: Secondary | ICD-10-CM

## 2022-03-11 DIAGNOSIS — Z95828 Presence of other vascular implants and grafts: Secondary | ICD-10-CM

## 2022-03-11 DIAGNOSIS — Z170 Estrogen receptor positive status [ER+]: Secondary | ICD-10-CM

## 2022-03-11 LAB — CMP (CANCER CENTER ONLY)
ALT: 8 U/L (ref 0–44)
AST: 13 U/L — ABNORMAL LOW (ref 15–41)
Albumin: 4.4 g/dL (ref 3.5–5.0)
Alkaline Phosphatase: 32 U/L — ABNORMAL LOW (ref 38–126)
Anion gap: 8 (ref 5–15)
BUN: 11 mg/dL (ref 6–20)
CO2: 26 mmol/L (ref 22–32)
Calcium: 9.7 mg/dL (ref 8.9–10.3)
Chloride: 106 mmol/L (ref 98–111)
Creatinine: 0.64 mg/dL (ref 0.44–1.00)
GFR, Estimated: >60 mL/min (ref >60)
Glucose, Bld: 104 mg/dL — ABNORMAL HIGH (ref 70–99)
Potassium: 3.9 mmol/L (ref 3.5–5.1)
Sodium: 140 mmol/L (ref 135–145)
Total Bilirubin: 0.4 mg/dL (ref 0.3–1.2)
Total Protein: 6.8 g/dL (ref 6.5–8.1)

## 2022-03-11 LAB — CBC WITH DIFFERENTIAL (CANCER CENTER ONLY)
Abs Immature Granulocytes: 0.04 10*3/uL (ref 0.00–0.07)
Basophils Absolute: 0 10*3/uL (ref 0.0–0.1)
Basophils Relative: 1 %
Eosinophils Absolute: 0.2 10*3/uL (ref 0.0–0.5)
Eosinophils Relative: 3 %
HCT: 33.3 % — ABNORMAL LOW (ref 36.0–46.0)
Hemoglobin: 11.7 g/dL — ABNORMAL LOW (ref 12.0–15.0)
Immature Granulocytes: 1 %
Lymphocytes Relative: 28 %
Lymphs Abs: 1.2 10*3/uL (ref 0.7–4.0)
MCH: 33.4 pg (ref 26.0–34.0)
MCHC: 35.1 g/dL (ref 30.0–36.0)
MCV: 95.1 fL (ref 80.0–100.0)
Monocytes Absolute: 0.3 10*3/uL (ref 0.1–1.0)
Monocytes Relative: 7 %
Neutro Abs: 2.7 10*3/uL (ref 1.7–7.7)
Neutrophils Relative %: 60 %
Platelet Count: 286 10*3/uL (ref 150–400)
RBC: 3.5 MIL/uL — ABNORMAL LOW (ref 3.87–5.11)
RDW: 12.8 % (ref 11.5–15.5)
WBC Count: 4.4 10*3/uL (ref 4.0–10.5)
nRBC: 0 % (ref 0.0–0.2)

## 2022-03-11 LAB — PREGNANCY, URINE: Preg Test, Ur: NEGATIVE

## 2022-03-11 MED ORDER — SODIUM CHLORIDE 0.9% FLUSH
10.0000 mL | Freq: Once | INTRAVENOUS | Status: AC
  Administered 2022-03-11: 10 mL

## 2022-03-11 MED ORDER — DIPHENHYDRAMINE HCL 50 MG/ML IJ SOLN
50.0000 mg | Freq: Once | INTRAMUSCULAR | Status: AC
  Administered 2022-03-11: 50 mg via INTRAVENOUS
  Filled 2022-03-11: qty 1

## 2022-03-11 MED ORDER — SODIUM CHLORIDE 0.9 % IV SOLN
10.0000 mg | Freq: Once | INTRAVENOUS | Status: AC
  Administered 2022-03-11: 10 mg via INTRAVENOUS
  Filled 2022-03-11: qty 10

## 2022-03-11 MED ORDER — HEPARIN SOD (PORK) LOCK FLUSH 100 UNIT/ML IV SOLN
500.0000 [IU] | Freq: Once | INTRAVENOUS | Status: AC | PRN
  Administered 2022-03-11: 500 [IU]

## 2022-03-11 MED ORDER — SODIUM CHLORIDE 0.9 % IV SOLN: Freq: Once | INTRAVENOUS | Status: AC

## 2022-03-11 MED ORDER — SODIUM CHLORIDE 0.9 % IV SOLN
65.0000 mg/m2 | Freq: Once | INTRAVENOUS | Status: AC
  Administered 2022-03-11: 114 mg via INTRAVENOUS
  Filled 2022-03-11: qty 19

## 2022-03-11 MED ORDER — SODIUM CHLORIDE 0.9% FLUSH
10.0000 mL | INTRAVENOUS | Status: DC | PRN
  Administered 2022-03-11: 10 mL

## 2022-03-11 MED ORDER — FAMOTIDINE IN NACL 20-0.9 MG/50ML-% IV SOLN
20.0000 mg | Freq: Once | INTRAVENOUS | Status: AC
  Administered 2022-03-11: 20 mg via INTRAVENOUS
  Filled 2022-03-11: qty 50

## 2022-03-16 ENCOUNTER — Ambulatory Visit (HOSPITAL_COMMUNITY): Payer: Medicaid Other

## 2022-03-17 MED FILL — Dexamethasone Sodium Phosphate Inj 100 MG/10ML: INTRAMUSCULAR | Qty: 1 | Status: AC

## 2022-03-18 ENCOUNTER — Encounter: Payer: Self-pay | Admitting: *Deleted

## 2022-03-18 ENCOUNTER — Other Ambulatory Visit: Payer: Self-pay | Admitting: General Surgery

## 2022-03-18 ENCOUNTER — Inpatient Hospital Stay: Payer: Medicaid Other

## 2022-03-18 ENCOUNTER — Other Ambulatory Visit: Payer: Self-pay

## 2022-03-18 ENCOUNTER — Inpatient Hospital Stay: Payer: Medicaid Other | Attending: Hematology and Oncology | Admitting: Adult Health

## 2022-03-18 VITALS — BP 138/91 | HR 101 | Temp 98.1°F | Resp 17 | Wt 144.3 lb

## 2022-03-18 DIAGNOSIS — Z170 Estrogen receptor positive status [ER+]: Secondary | ICD-10-CM | POA: Diagnosis not present

## 2022-03-18 DIAGNOSIS — C50411 Malignant neoplasm of upper-outer quadrant of right female breast: Secondary | ICD-10-CM

## 2022-03-18 DIAGNOSIS — G629 Polyneuropathy, unspecified: Secondary | ICD-10-CM | POA: Insufficient documentation

## 2022-03-18 DIAGNOSIS — Z95828 Presence of other vascular implants and grafts: Secondary | ICD-10-CM

## 2022-03-18 LAB — CMP (CANCER CENTER ONLY)
ALT: 8 U/L (ref 0–44)
AST: 15 U/L (ref 15–41)
Albumin: 4.5 g/dL (ref 3.5–5.0)
Alkaline Phosphatase: 31 U/L — ABNORMAL LOW (ref 38–126)
Anion gap: 6 (ref 5–15)
BUN: 12 mg/dL (ref 6–20)
CO2: 27 mmol/L (ref 22–32)
Calcium: 9.9 mg/dL (ref 8.9–10.3)
Chloride: 106 mmol/L (ref 98–111)
Creatinine: 0.59 mg/dL (ref 0.44–1.00)
GFR, Estimated: >60 mL/min (ref >60)
Glucose, Bld: 110 mg/dL — ABNORMAL HIGH (ref 70–99)
Potassium: 3.9 mmol/L (ref 3.5–5.1)
Sodium: 139 mmol/L (ref 135–145)
Total Bilirubin: 0.6 mg/dL (ref 0.3–1.2)
Total Protein: 6.8 g/dL (ref 6.5–8.1)

## 2022-03-18 LAB — CBC WITH DIFFERENTIAL (CANCER CENTER ONLY)
Abs Immature Granulocytes: 0.07 10*3/uL (ref 0.00–0.07)
Basophils Absolute: 0 10*3/uL (ref 0.0–0.1)
Basophils Relative: 1 %
Eosinophils Absolute: 0.2 10*3/uL (ref 0.0–0.5)
Eosinophils Relative: 3 %
HCT: 32.9 % — ABNORMAL LOW (ref 36.0–46.0)
Hemoglobin: 11.6 g/dL — ABNORMAL LOW (ref 12.0–15.0)
Immature Granulocytes: 1 %
Lymphocytes Relative: 17 %
Lymphs Abs: 1.1 10*3/uL (ref 0.7–4.0)
MCH: 33.2 pg (ref 26.0–34.0)
MCHC: 35.3 g/dL (ref 30.0–36.0)
MCV: 94.3 fL (ref 80.0–100.0)
Monocytes Absolute: 0.5 10*3/uL (ref 0.1–1.0)
Monocytes Relative: 7 %
Neutro Abs: 4.6 10*3/uL (ref 1.7–7.7)
Neutrophils Relative %: 71 %
Platelet Count: 262 10*3/uL (ref 150–400)
RBC: 3.49 MIL/uL — ABNORMAL LOW (ref 3.87–5.11)
RDW: 12.6 % (ref 11.5–15.5)
WBC Count: 6.4 10*3/uL (ref 4.0–10.5)
nRBC: 0 % (ref 0.0–0.2)

## 2022-03-18 LAB — PREGNANCY, URINE: Preg Test, Ur: NEGATIVE

## 2022-03-18 MED ORDER — SODIUM CHLORIDE 0.9% FLUSH
10.0000 mL | Freq: Once | INTRAVENOUS | Status: AC
  Administered 2022-03-18: 10 mL

## 2022-03-18 NOTE — Progress Notes (Addendum)
[facility] Follow up:    Default, Provider, MD No address on file   DIAGNOSIS:  Cancer Staging  Malignant neoplasm of upper-outer quadrant of right breast in female, estrogen receptor positive ([place]) Staging form: Breast, AJCC 8th Edition - Clinical stage from 10/16/2021: Stage IIA (cT3, cN1, cM0, G2, ER+, PR+, HER2: Equivocal) - Signed by [name], MD on 10/16/2021 Histologic grading system: 3 grade system   SUMMARY OF ONCOLOGIC HISTORY: Oncology History  Malignant neoplasm of upper-outer quadrant of right breast in female, estrogen receptor positive ([place])  10/16/2021 Initial Diagnosis   Malignant neoplasm of upper-outer quadrant of right breast in female, estrogen receptor positive ([place])    10/16/2021 Cancer Staging   Staging form: Breast, AJCC 8th Edition - Clinical stage from 10/16/2021: Stage IIA (cT3, cN1, cM0, G2, ER+, PR+, HER2: Equivocal) - Signed by [name], MD on 10/16/2021 Histologic grading system: 3 grade system    10/28/2021 -  Chemotherapy   Patient is on Treatment Plan : BREAST ADJUVANT DOSE DENSE AC q14d / PACLitaxel q7d       Genetic Testing   [facility] CancerNext-Expanded identified 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 Report date is 01/21/2022.  The CancerNext-Expanded gene panel offered by [facility] and includes sequencing, rearrangement, and RNA analysis for the following 77 genes: AIP, ALK, APC, ATM, AXIN2, BAP1, BARD1, BLM, BMPR1A, BRCA1, BRCA2, BRIP1, CDC73, CDH1, CDK4, CDKN1B, CDKN2A, CHEK2, CTNNA1, DICER1, FANCC, FH, FLCN, GALNT12, KIF1B, LZTR1, MAX, MEN1, MET, MLH1, MSH2, MSH3, MSH6, MUTYH, NBN, NF1, NF2, NTHL1, PALB2, PHOX2B, PMS2, POT1, PRKAR1A, PTCH1, PTEN, RAD51C, RAD51D, RB1, RECQL, RET, SDHA, SDHAF2, SDHB, SDHC, SDHD, SMAD4, SMARCA4, SMARCB1, SMARCE1, STK11, SUFU, TMEM127, TP53, TSC1, TSC2, VHL and XRCC2 (sequencing and deletion/duplication); EGFR, EGLN1, HOXB13, KIT, MITF, PDGFRA, POLD1, and POLE  (sequencing only); EPCAM and GREM1 (deletion/duplication only).      CURRENT THERAPY: weekly taxol  INTERVAL HISTORY: Monica [name] 26 y.o. female returns for follow-up prior to receiving cycle 10 of her weekly Taxol.  She is receiving her chemotherapy neoadjuvantly and had previously completed her 4 cycles of Adriamycin and Cytoxan.  She has been experiencing peripheral neuropathy that at her last appointment was intermittent however now it is worsened despite dose reduction from 80 mg per metered squared of Taxol to 65 mg per metered square.    Due to her neuropathy we went ahead and got her MRI on May 23 which showed no significant change in the size of the area of non-mass enhancement in the right breast with significant interval decrease in the amount of enhancement.  The right intramammary and axillary adenopathy had significantly decreased in size.  She has not seen Dr. Barry Dienes at this point.   Patient Active Problem List   Diagnosis Date Noted   BRCA2 gene mutation positive 01/14/2022   Genetic testing 01/14/2022   Port-A-Cath in place 11/04/2021   Malignant neoplasm of upper-outer quadrant of right breast in female, estrogen receptor positive ([place]) 10/16/2021    has No Known Allergies.  MEDICAL HISTORY: Past Medical History:  Diagnosis Date   Abscess    Cancer ([place]) 10/16/2021   right breast cancer    SURGICAL HISTORY: Past Surgical History:  Procedure Laterality Date   PORTACATH PLACEMENT Left 10/27/2021   Procedure: INSERTION PORT-A-CATH;  Surgeon: Stark Klein, MD;  Location: [facility];  Service: General;  Laterality: Left;    SOCIAL HISTORY: Social History   Socioeconomic History   Marital status: Married  Spouse name: Not on file   Number of children: Not on file   Years of education: Not on file   Highest education level: Not on file  Occupational History   Not on file  Tobacco Use   Smoking status: Never   Smokeless tobacco: Never  Vaping Use   Vaping  Use: Never used  Substance and Sexual Activity   Alcohol use: No   Drug use: Never   Sexual activity: Yes    Birth control/protection: Other-see comments    Comment: Depo Shot  Other Topics Concern   Not on file  Social History Narrative   Not on file   Social Determinants of Health   Financial Resource Strain: Not on file  Food Insecurity: Not on file  Transportation Needs: Not on file  Physical Activity: Not on file  Stress: Not on file  Social Connections: Not on file  Intimate Partner Violence: Not on file    FAMILY HISTORY: Family History  Problem Relation Age of Onset   Breast cancer Maternal Grandmother 40    Review of Systems  Constitutional:  Positive for fatigue (Mild fatigue). Negative for appetite change, chills, fever and unexpected weight change.  HENT:   Negative for hearing loss, lump/mass and trouble swallowing.   Eyes:  Negative for eye problems and icterus.  Respiratory:  Negative for chest tightness, cough and shortness of breath.   Cardiovascular:  Negative for chest pain, leg swelling and palpitations.  Gastrointestinal:  Negative for abdominal distention, abdominal pain, constipation, diarrhea, nausea and vomiting.  Endocrine: Negative for hot flashes.  Genitourinary:  Negative for difficulty urinating.   Musculoskeletal:  Negative for arthralgias.  Skin:  Negative for itching and rash.  Neurological:  Positive for numbness. Negative for dizziness, extremity weakness and headaches.  Hematological:  Negative for adenopathy. Does not bruise/bleed easily.  Psychiatric/Behavioral:  Negative for depression. The patient is not nervous/anxious.      PHYSICAL EXAMINATION  ECOG PERFORMANCE STATUS: 1 - Symptomatic but completely ambulatory  Vitals:   03/18/22 0949  BP: (!) 138/91  Pulse: (!) 101  Resp: 17  Temp: 98.1 F (36.7 C)  SpO2: 100%    Physical Exam Constitutional:      General: She is not in acute distress.    Appearance: Normal  appearance. She is not toxic-appearing.  HENT:     Head: Normocephalic and atraumatic.  Eyes:     General: No scleral icterus. Cardiovascular:     Rate and Rhythm: Normal rate and regular rhythm.     Pulses: Normal pulses.     Heart sounds: Normal heart sounds.  Pulmonary:     Effort: Pulmonary effort is normal.     Breath sounds: Normal breath sounds.  Abdominal:     General: Abdomen is flat. Bowel sounds are normal. There is no distension.     Palpations: Abdomen is soft.     Tenderness: There is no abdominal tenderness.  Musculoskeletal:        General: No swelling.     Cervical back: Neck supple.  Lymphadenopathy:     Cervical: No cervical adenopathy.  Skin:    General: Skin is warm and dry.     Findings: No rash.  Neurological:     General: No focal deficit present.     Mental Status: She is alert.  Psychiatric:        Mood and Affect: Mood normal.        Behavior: Behavior normal.    LABORATORY  DATA:  CBC    Component Value Date/Time   WBC 6.4 03/18/2022 0926   WBC 3.5 (L) 11/05/2021 1053   RBC 3.49 (L) 03/18/2022 0926   HGB 11.6 (L) 03/18/2022 0926   HCT 32.9 (L) 03/18/2022 0926   PLT 262 03/18/2022 0926   MCV 94.3 03/18/2022 0926   MCH 33.2 03/18/2022 0926   MCHC 35.3 03/18/2022 0926   RDW 12.6 03/18/2022 0926   LYMPHSABS 1.1 03/18/2022 0926   MONOABS 0.5 03/18/2022 0926   EOSABS 0.2 03/18/2022 0926   BASOSABS 0.0 03/18/2022 0926    CMP     Component Value Date/Time   NA 139 03/18/2022 0926   K 3.9 03/18/2022 0926   CL 106 03/18/2022 0926   CO2 27 03/18/2022 0926   GLUCOSE 110 (H) 03/18/2022 0926   BUN 12 03/18/2022 0926   CREATININE 0.59 03/18/2022 0926   CALCIUM 9.9 03/18/2022 0926   PROT 6.8 03/18/2022 0926   ALBUMIN 4.5 03/18/2022 0926   AST 15 03/18/2022 0926   ALT 8 03/18/2022 0926   ALKPHOS 31 (L) 03/18/2022 0926   BILITOT 0.6 03/18/2022 0926   GFRNONAA >60 03/18/2022 0926     ASSESSMENT and THERAPY PLAN:   Malignant neoplasm  of upper-outer quadrant of right breast in female, estrogen receptor positive ([place]) 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%, Ki-67CT chest angio: No PE, no mets ED visit 11/05/21: Severe back pain :CT Angio: No mets Bone scan 11/06/21 Bone scan: No mets MRI guided biopsies 10/29/21: Left breast bx: Benign 60%, HER2 2+ by IHC, FISH negative     Treatment plan: 1.  Neoadjuvant chemotherapy with dose dense Adriamycin and Cytoxan followed by Taxol weekly x12  (taxol stopped after 9 due to peripheral neuropathy) 2. Mastectomy versus lumpectomy with targeted node dissection 3.  Adjuvant radiation 4.  Followed by adjuvant antiestrogen therapy (ovarian suppression) along with CDK 4 and 6 inhibitor 5.  Genetic testing: BRCA 2 positive -------------------------------------------------------------------------------------------------------------   Current Treatment: Completed 4 cycles of dose dense Adriamycin and Cytoxan started 10/28/2018, today cycle 10 Taxol      Chemo Toxicities: Her neuropathy has worsened.  Due to this we are stopping chemotherapy today.  She met with Dr. Lindi Adie who reviewed that she received most of her treatment and a majority of the benefit from it.  She will now proceed with surgery.  I reached out to our navigators Dawn and Varney Biles to get her in with Dr. Barry Dienes for follow-up.  We will see her back after surgery.  Reviewed the images with her of her breast MRI which showed no significant size change however enhancement change.  We pulled these images up and compared the 2 and I showed her what that meant on the MRI images.  When she sees Dr. Barry Dienes she can discuss this in  more detail.   We will see chaos of back in about 6 weeks.   All questions were answered. The patient knows to call the clinic with any problems, questions or concerns. We can certainly see the patient much sooner if necessary.  Total encounter time:30 minutes*in face-to-face visit time, chart review, lab review, care coordination, order entry, and documentation of the encounter time.  Wilber Bihari, NP 03/18/22 10:42 AM Medical Oncology and Hematology [facility] [place], [place] [address] Tel. [phone]    Fax. [phone]  *Total Encounter Time as defined by the Centers for Medicare and Medicaid Services includes, in addition to the face-to-face time of a patient visit (documented in the note above) non-face-to-face time: obtaining and reviewing outside history, ordering and reviewing medications, tests or procedures, care coordination (communications with other health care professionals or caregivers) and documentation in the medical record.  Attending Note  I personally saw and examined Monica [name]. The plan of care was discussed with her. I agree with the physical exam findings and assessment and plan as documented above. I performed the majority of the counseling and assessment and plan regarding this encounter Chemo-induced peripheral neuropathy: I agree with the documentation above and recommended also to stop further chemo. This will complete her chemotherapy and we will set her up for a breast MRI and a follow-up with surgery to talk about mastectomy versus lumpectomy with targeted node dissection. Return to clinic after surgery to discuss pathology report. Signed Harriette Ohara, MD

## 2022-03-18 NOTE — Assessment & Plan Note (Signed)
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%, Ki-67CTchest angio: No PE, no mets ED visit 11/05/21: Severe back pain :CT Angio: No mets Bone scan 1/20/23Bone scan: No mets MRI guided biopsies1/12/23: Left breast bx: Benign 60%, HER2 2+ by IHC,FISH negative  Treatment plan: 1.Neoadjuvant chemotherapy with dose dense Adriamycin and Cytoxan followed by Taxol weekly x12 (taxol stopped after 9 due to peripheral neuropathy) 2.Mastectomyversus lumpectomy with targeted node dissection 3.Adjuvant radiation 4.Followed by adjuvant antiestrogen therapy (ovarian suppression) along with CDK 4 and 6 inhibitor 5.Genetic testing: BRCA 2 positive -------------------------------------------------------------------------------------------------------------  Current Treatment: Completed 4 cycles of dose dense Adriamycin and Cytoxan started 10/28/2018, today cycle 10 Taxol    Chemo Toxicities: Her neuropathy has worsened.  Due to this we are stopping chemotherapy today.  She met with Dr. Lindi Adie who reviewed that she received most of her treatment and a majority of the benefit from it.  She will now proceed with surgery.  I reached out to our navigators Dawn and Varney Biles to get her in with Dr. Barry Dienes for follow-up.  We will see her back after surgery.  Reviewed the images with her of her breast MRI which showed no significant size change however enhancement change.  We pulled these images up and compared the 2 and I showed her what that meant on the MRI images.  When she sees Dr. Barry Dienes she can discuss this in more detail.  We will see chaos of back in about 6 weeks.

## 2022-03-22 ENCOUNTER — Encounter: Payer: Self-pay | Admitting: *Deleted

## 2022-03-22 DIAGNOSIS — Z170 Estrogen receptor positive status [ER+]: Secondary | ICD-10-CM

## 2022-03-24 ENCOUNTER — Encounter: Payer: Self-pay | Admitting: *Deleted

## 2022-03-25 ENCOUNTER — Inpatient Hospital Stay: Payer: Medicaid Other

## 2022-03-26 ENCOUNTER — Encounter: Payer: Self-pay | Admitting: Plastic Surgery

## 2022-03-26 ENCOUNTER — Ambulatory Visit: Payer: Medicaid Other | Admitting: Plastic Surgery

## 2022-03-26 VITALS — BP 129/97 | HR 82 | Ht 66.0 in | Wt 144.4 lb

## 2022-03-26 DIAGNOSIS — C50911 Malignant neoplasm of unspecified site of right female breast: Secondary | ICD-10-CM

## 2022-03-27 NOTE — Progress Notes (Signed)
   Referring Provider Stark Klein, MD [address] [place],  [place] [address]   CC:  Breast cancer  Monica [name] is an 26 y.o. female.  HPI: 26 year old breast cancer of the right breast.  She desires reconstruction.  She is BRCA positive.  She is decided bilateral mastectomy.  Dr. Barry Dienes does not think she is a good candidate for nipple sparing.  Review of Systems General: No fever chills or unintended weight loss  Physical Exam    03/26/2022    9:06 AM 03/18/2022    9:49 AM 03/11/2022    9:21 AM  Vitals with BMI  Height $Remov'5\' 6"'aiqEAW$     Weight 144 lbs 6 oz 144 lbs 5 oz 141 lbs 8 oz  BMI 23.32 16.8 38.70  Systolic [phone]  Diastolic 97 91 71  Pulse 82 101 95    General:  No acute distress,  Alert and oriented, Non-Toxic, Normal speech and affect Breast: Significant breast asymmetry.  Biopsy site is noted.  Assessment/Plan Good candidate for bilateral implant-based reconstruction.  She is a good candidate for a staged reconstruction with tissue expander and Flex HD.  Lennice Sites 03/27/2022, 2:38 PM

## 2022-03-31 ENCOUNTER — Other Ambulatory Visit: Payer: Self-pay | Admitting: General Surgery

## 2022-03-31 DIAGNOSIS — Z170 Estrogen receptor positive status [ER+]: Secondary | ICD-10-CM

## 2022-04-01 ENCOUNTER — Inpatient Hospital Stay: Payer: Medicaid Other | Admitting: Hematology and Oncology

## 2022-04-01 ENCOUNTER — Inpatient Hospital Stay: Payer: Medicaid Other

## 2022-04-21 ENCOUNTER — Encounter: Payer: Self-pay | Admitting: *Deleted

## 2022-04-21 ENCOUNTER — Encounter: Payer: Self-pay | Admitting: Surgical

## 2022-04-21 NOTE — Progress Notes (Signed)
Patient ID: Monica [name], female    DOB: 1996-02-04, 26 y.o.   MRN: [id number]  Chief Complaint  Patient presents with   Pre-op Exam      ICD-10-CM   1. Malignant neoplasm of right female breast, unspecified estrogen receptor status, unspecified site of breast ([place])  C50.911       History of Present Illness: Monica [name] is a 26 y.o.  female  with a history of right breast cancer.  She presents for preoperative evaluation for upcoming procedure, immediate bilateral breast reconstruction placement tissue expanders and Flex HD, scheduled for 05/05/2022 with Dr.   Erin Hearing after bilateral skin sparing mastectomies, right seed localized lymph node biopsy and right sentinel lymph node biopsy and port removal by Dr. Barry Dienes.  No history of anesthesia. No history of DVT/PE.  No family history of DVT/PE.  No family or personal history of bleeding or clotting disorders.  Patient is not currently taking any blood thinners.  No history of CVA/MI.  She does currently have a Port-A-Cath in place  Summary of Previous Visit: She is BRCA positive, she has decided bilateral mastectomy.  She is not a good candidate for nipple sparing.  Job: Not currently working  [facility] Significant for: Recent breast cancer diagnosis-right breast, December 2022.  Chemotherapy/radiation: She did undergo chemotherapy in the neoadjuvant setting, however this was stopped early due to the neuropathy.  Patient reports she is feeling well lately, no recent changes to her health.  She reports that she feels relatively prepared for surgery as she has been watching a lot of videos from other breast cancer patients.  She feels that this is helped her prepare and become more knowledgeable about what to expect.   Past Medical History: Allergies: No Known Allergies  Current Medications:  Current Outpatient Medications:    ondansetron (ZOFRAN) 4 MG tablet, Take 1 tablet (4 mg total) by mouth every 8 (eight) hours as needed for  nausea or vomiting., Disp: 20 tablet, Rfl: 0   oxyCODONE (OXY IR/ROXICODONE) 5 MG immediate release tablet, Take 1 tablet (5 mg total) by mouth every 6 (six) hours as needed for up to 5 days for severe pain., Disp: 20 tablet, Rfl: 0   sulfamethoxazole-trimethoprim (BACTRIM DS) 800-160 MG tablet, Take 1 tablet by mouth 2 (two) times daily for 14 days., Disp: 28 tablet, Rfl: 0   gabapentin (NEURONTIN) 100 MG capsule, Take 100 mg by mouth daily. (Patient not taking: Reported on 03/04/2022), Disp: , Rfl:    naproxen (NAPROSYN) 500 MG tablet, Take 500 mg by mouth 2 (two) times daily. (Patient not taking: Reported on 03/04/2022), Disp: , Rfl:    VENTOLIN HFA 108 (90 Base) MCG/ACT inhaler, , Disp: , Rfl:   Past Medical Problems: Past Medical History:  Diagnosis Date   Abscess    Cancer ([place]) 10/16/2021   right breast cancer    Past Surgical History: Past Surgical History:  Procedure Laterality Date   PORTACATH PLACEMENT Left 10/27/2021   Procedure: INSERTION PORT-A-CATH;  Surgeon: Stark Klein, MD;  Location: [facility];  Service: General;  Laterality: Left;    Social History: Social History   Socioeconomic History   Marital status: Married    Spouse name: Not on file   Number of children: Not on file   Years of education: Not on file   Highest education level: Not on file  Occupational History   Not on file  Tobacco Use   Smoking status: Never   Smokeless tobacco: Never  Vaping Use   Vaping Use: Never used  Substance and Sexual Activity   Alcohol use: No   Drug use: Never   Sexual activity: Yes    Birth control/protection: Other-see comments    Comment: Depo Shot  Other Topics Concern   Not on file  Social History Narrative   Not on file   Social Determinants of Health   Financial Resource Strain: Not on file  Food Insecurity: Not on file  Transportation Needs: Not on file  Physical Activity: Not on file  Stress: Not on file  Social Connections: Not on file  Intimate  Partner Violence: Not on file    Family History: Family History  Problem Relation Age of Onset   Breast cancer Maternal Grandmother 40    Review of Systems: Review of Systems  Constitutional: Negative.   Respiratory: Negative.    Cardiovascular: Negative.   Gastrointestinal: Negative.   Neurological: Negative.     Physical Exam: Vital Signs BP 115/83 (BP Location: Left Arm, Patient Position: Sitting, Cuff Size: Normal)   Pulse 84   Temp 98.9 F (37.2 C) (Oral)   Resp 16   Ht 5' 6"  (1.676 m)   Wt 141 lb (64 kg)   SpO2 98%   BMI 22.76 kg/m   Physical Exam  Constitutional:      General: Not in acute distress.    Appearance: Normal appearance. Not ill-appearing.  HENT:     Head: Normocephalic and atraumatic.  Eyes:     Pupils: Pupils are equal, round Neck:     Musculoskeletal: Normal range of motion.  Cardiovascular:     Rate and Rhythm: Normal rate    Pulses: Normal pulses.  Pulmonary:     Effort: Pulmonary effort is normal. No respiratory distress.  Musculoskeletal: Normal range of motion.  Skin:    General: Skin is warm and dry.     Findings: No erythema or rash.  Neurological:     General: No focal deficit present.     Mental Status: Alert and oriented to person, place, and time. Mental status is at baseline.     Motor: No weakness.  Psychiatric:        Mood and Affect: Mood normal.        Behavior: Behavior normal.    Assessment/Plan: The patient is scheduled for immediate but lateral breast reconstruction placement of tissue expanders and Flex HD with Dr. Erin Hearing.  Risks, benefits, and alternatives of procedure discussed, questions answered and consent obtained.    Smoking Status: Reports smoking hookah occasionally, reports there is no nicotine.  We discussed cessation of this prior to surgery to decrease risk of postoperative complications.  Caprini Score: 6, high; Risk Factors include: Right breast cancer, Port-A-Cath in place, and length of  planned surgery. Recommendation for mechanical prophylaxis. Encourage early ambulation.   Pictures obtained: 11/09/2021  Post-op Rx sent to pharmacy: Oxycodone, Zofran, Bactrim  Patient was provided with the breast reconstruction and General Surgical Risk consent document and Pain Medication Agreement prior to their appointment.  They had adequate time to read through the risk consent documents and Pain Medication Agreement. We also discussed them in person together during this preop appointment. All of their questions were answered to their satisfaction.  Recommended calling if they have any further questions.  Risk consent form and Pain Medication Agreement to be scanned into patient's chart.  The risks that can be encountered with and after placement of a breast expander placement were discussed and include the  following but not limited to these: bleeding, infection, delayed healing, anesthesia risks, skin sensation changes, injury to structures including nerves, blood vessels, and muscles which may be temporary or permanent, allergies to tape, suture materials and glues, blood products, topical preparations or injected agents, skin contour irregularities, skin discoloration and swelling, deep vein thrombosis, cardiac and pulmonary complications, pain, which may persist, fluid accumulation, wrinkling of the skin over the expander, changes in nipple or breast sensation, expander leakage or rupture, faulty position of the expander, persistent pain, formation of tight scar tissue around the expander (capsular contracture), possible need for revisional surgery or staged procedures.  Patient was provided with the [employer] implant patient decision checklist and this was completed during today's preoperative evaluation. Patient had time to read through the information and any questions were answered to their content. Form will be scanned into patient's chart.   Electronically signed by: Carola Rhine Verl Kitson,  PA-C 04/22/2022 11:57 AM

## 2022-04-22 ENCOUNTER — Ambulatory Visit (INDEPENDENT_AMBULATORY_CARE_PROVIDER_SITE_OTHER): Payer: Medicaid Other | Admitting: Surgical

## 2022-04-22 ENCOUNTER — Telehealth: Payer: Self-pay | Admitting: Hematology and Oncology

## 2022-04-22 VITALS — BP 115/83 | HR 84 | Temp 98.9°F | Resp 16 | Ht 66.0 in | Wt 141.0 lb

## 2022-04-22 DIAGNOSIS — Z1501 Genetic susceptibility to malignant neoplasm of breast: Secondary | ICD-10-CM

## 2022-04-22 DIAGNOSIS — C50911 Malignant neoplasm of unspecified site of right female breast: Secondary | ICD-10-CM

## 2022-04-22 MED ORDER — OXYCODONE HCL 5 MG PO TABS: 5.0000 mg | ORAL_TABLET | Freq: Four times a day (QID) | ORAL | 0 refills | Status: DC | PRN

## 2022-04-22 MED ORDER — SULFAMETHOXAZOLE-TRIMETHOPRIM 800-160 MG PO TABS: 1.0000 | ORAL_TABLET | Freq: Two times a day (BID) | ORAL | 0 refills | Status: AC

## 2022-04-22 MED ORDER — ONDANSETRON HCL 4 MG PO TABS: 4.0000 mg | ORAL_TABLET | Freq: Three times a day (TID) | ORAL | 0 refills | Status: DC | PRN

## 2022-04-22 NOTE — Telephone Encounter (Signed)
.  Called patient to schedule appointment per 7/5 inbasket, patient is aware of date and time.   

## 2022-04-27 ENCOUNTER — Encounter: Payer: Self-pay | Admitting: *Deleted

## 2022-04-27 NOTE — Pre-Procedure Instructions (Addendum)
Surgical Instructions    Your procedure is scheduled on Tuesday 05/04/22.   Report to [facility] Main Entrance "A" at 05:30 A.M., then check in with the Admitting office.  Call this number if you have problems the morning of surgery:  [phone]   If you have any questions prior to your surgery date call [phone]: Open Monday-Friday 8am-4pm    Remember:  Do not eat after midnight the night before your surgery  You may drink clear liquids until 04:30 A.M. the morning of your surgery.   Clear liquids allowed are: Water, Non-Citrus Juices (without pulp), Carbonated Beverages, Clear Tea, Black Coffee ONLY (NO MILK, CREAM OR POWDERED CREAMER of any kind), and Gatorade  Patient Instructions  The night before surgery:  No food after midnight. ONLY clear liquids after midnight  The day of surgery (if you do NOT have diabetes):  Drink ONE (1) Pre-Surgery Clear Ensure by 04:30 A.M. the morning of surgery. Drink in one sitting. Do not sip.  This drink was given to you during your hospital  pre-op appointment visit.  Nothing else to drink after completing the  Pre-Surgery Clear Ensure.         If you have questions, please contact your surgeon's office.     Take these medicines the morning of surgery with A SIP OF WATER:   NONE   As of today, STOP taking any Aspirin (unless otherwise instructed by your surgeon) Aleve, Naproxen, Ibuprofen, Motrin, Advil, Goody's, BC's, all herbal medications, fish oil, and all vitamins.           Do not wear jewelry or makeup Do not wear lotions, powders, perfumes/colognes, or deodorant. Do not shave 48 hours prior to surgery.  Men may shave face and neck. Do not bring valuables to the hospital. Do not wear nail polish, gel polish, artificial nails, or any other type of covering on natural nails (fingers and toes) If you have artificial nails or gel coating that need to be removed by a nail salon, please have this removed prior to surgery.  Artificial nails or gel coating may interfere with anesthesia's ability to adequately monitor your vital signs.  [facility] is not responsible for any belongings or valuables. .   Do NOT Smoke (Tobacco/Vaping)  24 hours prior to your procedure  If you use a CPAP at night, you may bring your mask for your overnight stay.   Contacts, glasses, hearing aids, dentures or partials may not be worn into surgery, please bring cases for these belongings   For patients admitted to the hospital, discharge time will be determined by your treatment team.   Patients discharged the day of surgery will not be allowed to drive home, and someone needs to stay with them for 24 hours.   SURGICAL WAITING ROOM VISITATION Patients having surgery or a procedure in a hospital may have two support people. Children under the age of 90 must have an adult with them who is not the patient. They may stay in the waiting area during the procedure and may switch out with other visitors. If the patient needs to stay at the hospital during part of their recovery, the visitor guidelines for inpatient rooms apply.  Please refer to the [facility] website for the visitor guidelines for Inpatients (after your surgery is over and you are in a regular room).       Special instructions:    Oral Hygiene is also important to reduce your risk of infection.  Remember -  BRUSH YOUR TEETH THE MORNING OF SURGERY WITH YOUR REGULAR TOOTHPASTE   - Preparing For Surgery  Before surgery, you can play an important role. Because skin is not sterile, your skin needs to be as free of germs as possible. You can reduce the number of germs on your skin by washing with CHG (chlorahexidine gluconate) Soap before surgery.  CHG is an antiseptic cleaner which kills germs and bonds with the skin to continue killing germs even after washing.     Please do not use if you have an allergy to CHG or antibacterial soaps. If your skin becomes  reddened/irritated stop using the CHG.  Do not shave (including legs and underarms) for at least 48 hours prior to first CHG shower. It is OK to shave your face.  Please follow these instructions carefully.     Shower the NIGHT BEFORE SURGERY and the MORNING OF SURGERY with CHG Soap.   If you chose to wash your hair, wash your hair first as usual with your normal shampoo. After you shampoo, rinse your hair and body thoroughly to remove the shampoo.  Then [employer] and genitals (private parts) with your normal soap and rinse thoroughly to remove soap.  After that Use CHG Soap as you would any other liquid soap. You can apply CHG directly to the skin and wash gently with a scrungie or a clean washcloth.   Apply the CHG Soap to your body ONLY FROM THE NECK DOWN.  Do not use on open wounds or open sores. Avoid contact with your eyes, ears, mouth and genitals (private parts). Wash Face and genitals (private parts)  with your normal soap.   Wash thoroughly, paying special attention to the area where your surgery will be performed.  Thoroughly rinse your body with warm water from the neck down.  DO NOT shower/wash with your normal soap after using and rinsing off the CHG Soap.  Pat yourself dry with a CLEAN TOWEL.  Wear CLEAN PAJAMAS to bed the night before surgery  Place CLEAN SHEETS on your bed the night before your surgery  DO NOT SLEEP WITH PETS.   Day of Surgery:  Take a shower with CHG soap. Wear Clean/Comfortable clothing the morning of surgery Do not apply any deodorants/lotions.   Remember to brush your teeth WITH YOUR REGULAR TOOTHPASTE.    If you received a COVID test during your pre-op visit, it is requested that you wear a mask when out in public, stay away from anyone that may not be feeling well, and notify your surgeon if you develop symptoms. If you have been in contact with anyone that has tested positive in the last 10 days, please notify your surgeon.    Please  read over the following fact sheets that you were given.

## 2022-04-28 ENCOUNTER — Encounter (HOSPITAL_COMMUNITY): Payer: Self-pay

## 2022-04-28 ENCOUNTER — Telehealth: Payer: Self-pay | Admitting: Hematology and Oncology

## 2022-04-28 ENCOUNTER — Other Ambulatory Visit: Payer: Self-pay

## 2022-04-28 ENCOUNTER — Encounter (HOSPITAL_COMMUNITY)
Admission: RE | Admit: 2022-04-28 | Discharge: 2022-04-28 | Disposition: A | Discharge status: Home / Self Care | Payer: Medicaid Other | Source: Ambulatory Visit | Attending: General Surgery | Admitting: General Surgery

## 2022-04-28 VITALS — BP 116/95 | HR 89 | Temp 98.4°F | Resp 16

## 2022-04-28 DIAGNOSIS — Z01818 Encounter for other preprocedural examination: Secondary | ICD-10-CM

## 2022-04-28 DIAGNOSIS — Z01812 Encounter for preprocedural laboratory examination: Secondary | ICD-10-CM | POA: Diagnosis not present

## 2022-04-28 LAB — CBC
HCT: 39.5 % (ref 36.0–46.0)
Hemoglobin: 13.7 g/dL (ref 12.0–15.0)
MCH: 32.7 pg (ref 26.0–34.0)
MCHC: 34.7 g/dL (ref 30.0–36.0)
MCV: 94.3 fL (ref 80.0–100.0)
Platelets: 249 10*3/uL (ref 150–400)
RBC: 4.19 MIL/uL (ref 3.87–5.11)
RDW: 11.6 % (ref 11.5–15.5)
WBC: 4.9 10*3/uL (ref 4.0–10.5)
nRBC: 0 % (ref 0.0–0.2)

## 2022-04-28 NOTE — Telephone Encounter (Signed)
.  Called patient to schedule appointment per 7/11 inbasket, patient is aware of date and time.   

## 2022-04-28 NOTE — Progress Notes (Signed)
PCP -  [facility] Cardiologist - denies Oncologist- Dr. [name]  PPM/ICD - n/a Device Orders - n/a Rep Notified - n/a  Chest x-ray - n/a EKG - 11/03/21 Stress Test - denies ECHO - 10/26/21 Cardiac Cath - denies  Sleep Study - denies CPAP - denies  Fasting Blood Sugar - n/a Checks Blood Sugar _____ times a day- n/a  Blood Thinner Instructions: n/a Aspirin Instructions: n/a  ERAS Protcol - Clear liquids until 0430 am day of surgery PRE-SURGERY Ensure or G2- Ensure  COVID TEST- n/a   Anesthesia review: Yes. Seed Placement. Patient is coming to PAT at 12:00 on Monday 05/03/22 for urine pregnancy test prior to seed placement. Patient is aware of this.   Patient denies shortness of breath, fever, cough and chest pain at PAT appointment   All instructions explained to the patient, with a verbal understanding of the material. Patient agrees to go over the instructions while at home for a better understanding. The opportunity to ask questions was provided.

## 2022-04-29 ENCOUNTER — Inpatient Hospital Stay: Payer: Medicaid Other | Admitting: Hematology and Oncology

## 2022-05-03 ENCOUNTER — Ambulatory Visit
Admission: RE | Admit: 2022-05-03 | Discharge: 2022-05-03 | Disposition: A | Discharge status: Home / Self Care | Payer: Medicaid Other | Source: Ambulatory Visit | Attending: General Surgery | Admitting: General Surgery

## 2022-05-03 ENCOUNTER — Other Ambulatory Visit: Payer: Self-pay | Admitting: General Surgery

## 2022-05-03 DIAGNOSIS — C50411 Malignant neoplasm of upper-outer quadrant of right female breast: Secondary | ICD-10-CM

## 2022-05-03 NOTE — H&P (Signed)
PROVIDER:  Georgianne Fick, MD Patient Care Team: Kaleen Mask, PA as PCP - General (Family Medicine) Georgianne Fick, MD as Consulting Provider (Surgical Oncology) Rulon Eisenmenger, MD (Hematology and Oncology)     MRN: [id number] DOB: 12/29/1995  Subjective     Chief Complaint: No chief complaint on file.       History of Present Illness: Monica [name] is a 26 y.o. female who is seen today for follow up of right breast cancer.    Initial history: Patient presented with a new diagnosis of right breast cancer from December 2022.  She presented with a palpable mass.  Given her age, she had not been getting any screening studies.  Diagnostic imaging demonstrated breast density C.  There was also an irregular mass centrally from 11:00 to 2:00 measuring at least 5.1 cm.  There were several smaller irregular components adjacent to this.  There is an intramammary lymph node at 10:00 measuring 5 mm.  And an abnormal rounded lymph node measuring 6 mm in the right axilla.  The left breast appeared benign.  She subsequently had core needle biopsies of all 3 lesions.  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  HER2 was equivocal and FISH is pending.   She is married and was working from home.  She has 2 daughters, ages 60 and 71.  She is accompanied by her husband.  Of note, her maternal grandmother had breast cancer diagnosed at age 73.  She does not have other family history of cancer that she is aware of.     Interval history: Patient was found to have a BRCA mutation.  She has been undergoing chemotherapy in the neoadjuvant setting.  She has had improvement in the firmness of her breast.  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  The neuropathy she is experiencing is primarily in her great toe on the right.  Repeat MRI showed a  significant decrease in the enhancement in her upper right breast, but the size was still the same which was approximately 9.2 cm of non-mass enhancement.  The initial MRI did show an abnormality on the left.  This was biopsied and was benign and concordant.  She had resolved adenopathy in the right axilla.  Her left breast remained benign.  She saw Dr. Erin Hearing of plastics and with the BRCA2 she does desire bilateral mastectomies with reconstruction.   Review of Systems: A complete review of systems was obtained from the patient.  I have reviewed this information and discussed as appropriate with the patient.  See HPI as well for other ROS.   Review of Systems  All other systems reviewed and are negative.       Medical History: Past Medical History      Past Medical History:  Diagnosis Date   History of cancer             Patient Active Problem List  Diagnosis   Malignant neoplasm of upper-outer quadrant of right breast in female, estrogen receptor positive (CMS-HCC)   BRCA2 positive      Past Surgical History  No past surgical history on file.      Allergies  No Known Allergies     No current outpatient medications on file prior to visit.    No current facility-administered medications on file prior to  visit.      Family History       Family History  Problem Relation Age of Onset   Breast cancer Maternal Grandmother          Social History       Tobacco Use  Smoking Status Never  Smokeless Tobacco Never      Social History  Social History        Socioeconomic History   Marital status: Single  Tobacco Use   Smoking status: Never   Smokeless tobacco: Never  Substance and Sexual Activity   Alcohol use: Never   Drug use: Never        Objective:      There were no vitals filed for this visit.  There is no height or weight on file to calculate BMI.    Head:   Normocephalic and atraumatic.  Eyes:    Conjunctivae are normal. Pupils are equal,  round, and reactive to light. No scleral icterus.  Neck:   Normal range of motion. Neck supple. No tracheal deviation present. No thyromegaly present.  Resp:   No respiratory distress, normal effort. Breast: There is still an area of thickening in her upper outer right breast.  This is significantly softer and has not very appreciable.  She also has a little bit of fibrosis in the axilla.  I do not feel any discrete lymph nodes.  Her right breast remains smaller than the left.  They are both ptotic.  Neurological: Alert and oriented to person, place, and time. Coordination normal.  Skin:    Skin is warm and dry. No rash noted. No diaphoretic. No erythema. No pallor.  Psychiatric: Normal mood and affect. Normal behavior. Judgment and thought content normal.      Labs, Imaging and Diagnostic Testing:   Repeat MR post chemo 03/09/2022 IMPRESSION: 1. No significant change in size of the previously measured large area of non mass enhancement in the right breast with a significant interval decrease in amount of enhancement. 2. The right breast remains smaller than the left breast with an interval anterior skin indentation laterally. 3. Resolved right intramammary and axillary metastatic adenopathy with a significant decrease in size of 5 right axillary lymph nodes. 4. No evidence of malignancy on the left.   RECOMMENDATION: Treatment plan.   BI-RADS CATEGORY  6: Known biopsy-proven malignancy.   Initial MR 10/22/2021 IMPRESSION: 1. Extensive non-mass enhancement involving the UPPER INNER QUADRANT of the contracted RIGHT breast. Enhancing mass in the UPPER OUTER QUADRANT of the RIGHT breast. The biopsy marking clip is at the lateral edge of the non-mass enhancement at the 12 o'clock position. Measurements are given above. 2. Biopsy-proven metastatic intramammary lymph node in the outer RIGHT breast. 3. Two pathologic RIGHT axillary lymph nodes, including the biopsy-proven metastatic  node. 4. Indeterminate non-mass enhancement involving the UPPER OUTER QUADRANT of the LEFT breast.   RECOMMENDATION: MRI guided biopsy of the non-mass enhancement in the LEFT breast.   BI-RADS CATEGORY  4: Suspicious.       Assessment and Plan:     Diagnoses and all orders for this visit:   Malignant neoplasm of upper-outer quadrant of right breast in female, estrogen receptor positive (CMS-HCC)   BRCA2 positive   Patient desires bilateral mastectomy with reconstruction.  I went back through all of her imaging to evaluate her lymph nodes.  Her original imaging only showed 2 axillary lymph nodes that appeared abnormal.  She had one that was also in the breast  that was abnormal.  Because of the number of nodes in that location, we will plan to do a targeted biopsy of the initially positive lymph node and then a sentinel lymph node biopsy to retrieve further nodes in the axilla.  I discussed with Dr. Lindi Adie that her port can be removed.   I reviewed mastectomy with the patient.  I discussed the borders of the breast.  I advised that I would not plan to do a nipple sparing mastectomy given the level of ptosis that she has.  I discussed risks of bleeding, infection, wound complications, prolonged drain presents, loss of reconstruction, chronic pain, dissatisfaction with her reconstruction, lymphedema, and reduced range of motion at her shoulders.  I discussed that there is at least approximately a 1% risk of significant bleeding potentially requiring a trip back to the operating room or a blood transfusion.   I discussed recovery time.  I advised that with reconstruction that plastics will let her know when she is allowed to do any heavy lifting or strenuous activity.  I stated that this could be anywhere from 2 to 8 weeks.  She is no longer working from home.  She states that she does have help for when she is postop.  Her youngest child is 37 and will need help.   I have sent plastics a message  that orders are being written for surgery so that they can see her back for preop.  She will need several weeks to recover from chemotherapy and make sure her counts are better.  Patient understands and wishes to proceed.

## 2022-05-03 NOTE — Progress Notes (Signed)
Pt came to PAT today for POCT Urine preg test. Unable to cross records into Epic at this time d/t pt not being admitted for an appt. Urine POCT preg NEGATIVE.   Jacqlyn Larsen, RN

## 2022-05-04 ENCOUNTER — Other Ambulatory Visit: Payer: Self-pay

## 2022-05-04 ENCOUNTER — Ambulatory Visit (HOSPITAL_COMMUNITY): Payer: Medicaid Other | Admitting: Physician Assistant

## 2022-05-04 ENCOUNTER — Ambulatory Visit (HOSPITAL_COMMUNITY)
Admission: RE | Admit: 2022-05-04 | Discharge: 2022-05-05 | Disposition: A | Discharge status: Home / Self Care | Payer: Medicaid Other | Attending: General Surgery | Admitting: General Surgery

## 2022-05-04 ENCOUNTER — Ambulatory Visit
Admission: RE | Admit: 2022-05-04 | Discharge: 2022-05-04 | Disposition: A | Discharge status: Home / Self Care | Payer: Medicaid Other | Source: Ambulatory Visit | Attending: General Surgery | Admitting: General Surgery

## 2022-05-04 ENCOUNTER — Encounter (HOSPITAL_COMMUNITY): Payer: Self-pay | Admitting: General Surgery

## 2022-05-04 ENCOUNTER — Ambulatory Visit (HOSPITAL_BASED_OUTPATIENT_CLINIC_OR_DEPARTMENT_OTHER): Payer: Medicaid Other | Admitting: General Practice

## 2022-05-04 ENCOUNTER — Encounter (HOSPITAL_COMMUNITY)
Admission: RE | Disposition: A | Discharge status: Home / Self Care | Payer: Self-pay | Source: Home / Self Care | Attending: General Surgery

## 2022-05-04 ENCOUNTER — Encounter (HOSPITAL_COMMUNITY)
Admission: RE | Admit: 2022-05-04 | Discharge: 2022-05-04 | Disposition: A | Discharge status: Home / Self Care | Payer: Medicaid Other | Source: Ambulatory Visit | Attending: General Surgery | Admitting: General Surgery

## 2022-05-04 DIAGNOSIS — Z803 Family history of malignant neoplasm of breast: Secondary | ICD-10-CM | POA: Insufficient documentation

## 2022-05-04 DIAGNOSIS — N6041 Mammary duct ectasia of right breast: Secondary | ICD-10-CM | POA: Insufficient documentation

## 2022-05-04 DIAGNOSIS — C50411 Malignant neoplasm of upper-outer quadrant of right female breast: Secondary | ICD-10-CM | POA: Diagnosis not present

## 2022-05-04 DIAGNOSIS — C50911 Malignant neoplasm of unspecified site of right female breast: Secondary | ICD-10-CM

## 2022-05-04 DIAGNOSIS — N6012 Diffuse cystic mastopathy of left breast: Secondary | ICD-10-CM | POA: Insufficient documentation

## 2022-05-04 DIAGNOSIS — Z170 Estrogen receptor positive status [ER+]: Secondary | ICD-10-CM

## 2022-05-04 DIAGNOSIS — Z9221 Personal history of antineoplastic chemotherapy: Secondary | ICD-10-CM | POA: Insufficient documentation

## 2022-05-04 DIAGNOSIS — Z421 Encounter for breast reconstruction following mastectomy: Secondary | ICD-10-CM

## 2022-05-04 DIAGNOSIS — Z4001 Encounter for prophylactic removal of breast: Secondary | ICD-10-CM

## 2022-05-04 DIAGNOSIS — Z1501 Genetic susceptibility to malignant neoplasm of breast: Secondary | ICD-10-CM | POA: Diagnosis not present

## 2022-05-04 DIAGNOSIS — N6042 Mammary duct ectasia of left breast: Secondary | ICD-10-CM | POA: Diagnosis not present

## 2022-05-04 DIAGNOSIS — G629 Polyneuropathy, unspecified: Secondary | ICD-10-CM | POA: Diagnosis not present

## 2022-05-04 DIAGNOSIS — Z1509 Genetic susceptibility to other malignant neoplasm: Secondary | ICD-10-CM | POA: Insufficient documentation

## 2022-05-04 DIAGNOSIS — Z1507 Genetic susceptibility to malignant neoplasm of urinary tract: Secondary | ICD-10-CM | POA: Diagnosis present

## 2022-05-04 DIAGNOSIS — C50919 Malignant neoplasm of unspecified site of unspecified female breast: Secondary | ICD-10-CM | POA: Diagnosis present

## 2022-05-04 HISTORY — PX: PORT-A-CATH REMOVAL: SHX5289

## 2022-05-04 HISTORY — PX: BREAST RECONSTRUCTION WITH PLACEMENT OF TISSUE EXPANDER AND FLEX HD (ACELLULAR HYDRATED DERMIS): SHX6295

## 2022-05-04 HISTORY — PX: MASTECTOMY W/ SENTINEL NODE BIOPSY: SHX2001

## 2022-05-04 SURGERY — MASTECTOMY WITH SENTINEL LYMPH NODE BIOPSY
Anesthesia: General | Site: Chest | Laterality: Left

## 2022-05-04 MED ORDER — GABAPENTIN 100 MG PO CAPS
200.0000 mg | ORAL_CAPSULE | Freq: Two times a day (BID) | ORAL | Status: DC
  Administered 2022-05-04 – 2022-05-05 (×2): 200 mg via ORAL
  Filled 2022-05-04 (×2): qty 2

## 2022-05-04 MED ORDER — ONDANSETRON HCL 4 MG/2ML IJ SOLN: 4.0000 mg | Freq: Four times a day (QID) | INTRAMUSCULAR | Status: DC | PRN

## 2022-05-04 MED ORDER — DIPHENHYDRAMINE HCL 50 MG/ML IJ SOLN: 12.5000 mg | Freq: Four times a day (QID) | INTRAMUSCULAR | Status: DC | PRN

## 2022-05-04 MED ORDER — MAGTRACE LYMPHATIC TRACER
INTRAMUSCULAR | Status: DC | PRN
  Administered 2022-05-04: 2 mL via INTRAMUSCULAR

## 2022-05-04 MED ORDER — CHLORHEXIDINE GLUCONATE CLOTH 2 % EX PADS: 6.0000 | MEDICATED_PAD | Freq: Once | CUTANEOUS | Status: DC

## 2022-05-04 MED ORDER — ROCURONIUM BROMIDE 10 MG/ML (PF) SYRINGE
PREFILLED_SYRINGE | INTRAVENOUS | Status: AC
Start: 2022-05-04 — End: ?
  Filled 2022-05-04: qty 10

## 2022-05-04 MED ORDER — ONDANSETRON HCL 4 MG/2ML IJ SOLN: 4.0000 mg | Freq: Once | INTRAMUSCULAR | Status: DC | PRN

## 2022-05-04 MED ORDER — SCOPOLAMINE 1 MG/3DAYS TD PT72
1.0000 | MEDICATED_PATCH | Freq: Once | TRANSDERMAL | Status: DC
  Administered 2022-05-04: 1.5 mg via TRANSDERMAL
  Filled 2022-05-04: qty 1

## 2022-05-04 MED ORDER — 0.9 % SODIUM CHLORIDE (POUR BTL) OPTIME
TOPICAL | Status: DC | PRN
  Administered 2022-05-04 (×3): 1000 mL

## 2022-05-04 MED ORDER — OXYCODONE HCL 5 MG/5ML PO SOLN: 5.0000 mg | Freq: Once | ORAL | Status: DC | PRN

## 2022-05-04 MED ORDER — ACETAMINOPHEN 500 MG PO TABS: 1000.0000 mg | ORAL_TABLET | Freq: Every day | ORAL | Status: DC | PRN

## 2022-05-04 MED ORDER — ONDANSETRON 4 MG PO TBDP: 4.0000 mg | ORAL_TABLET | Freq: Four times a day (QID) | ORAL | Status: DC | PRN

## 2022-05-04 MED ORDER — KETOROLAC TROMETHAMINE 30 MG/ML IJ SOLN
INTRAMUSCULAR | Status: AC
  Filled 2022-05-04: qty 1

## 2022-05-04 MED ORDER — HYDROMORPHONE HCL 1 MG/ML IJ SOLN
0.2500 mg | INTRAMUSCULAR | Status: DC | PRN
  Administered 2022-05-04 (×2): 0.25 mg via INTRAVENOUS

## 2022-05-04 MED ORDER — HYDROMORPHONE HCL 1 MG/ML IJ SOLN
INTRAMUSCULAR | Status: AC
  Filled 2022-05-04: qty 1

## 2022-05-04 MED ORDER — FENTANYL CITRATE (PF) 250 MCG/5ML IJ SOLN
INTRAMUSCULAR | Status: AC
  Filled 2022-05-04: qty 5

## 2022-05-04 MED ORDER — ONDANSETRON HCL 4 MG/2ML IJ SOLN
INTRAMUSCULAR | Status: DC | PRN
  Administered 2022-05-04: 4 mg via INTRAVENOUS

## 2022-05-04 MED ORDER — SUGAMMADEX SODIUM 200 MG/2ML IV SOLN
INTRAVENOUS | Status: DC | PRN
  Administered 2022-05-04: 200 mg via INTRAVENOUS

## 2022-05-04 MED ORDER — PHENYLEPHRINE 80 MCG/ML (10ML) SYRINGE FOR IV PUSH (FOR BLOOD PRESSURE SUPPORT)
PREFILLED_SYRINGE | INTRAVENOUS | Status: DC | PRN
  Administered 2022-05-04 (×11): 80 ug via INTRAVENOUS

## 2022-05-04 MED ORDER — SULFAMETHOXAZOLE-TRIMETHOPRIM 800-160 MG PO TABS
1.0000 | ORAL_TABLET | Freq: Two times a day (BID) | ORAL | Status: DC
  Administered 2022-05-04 – 2022-05-05 (×2): 1 via ORAL
  Filled 2022-05-04 (×2): qty 1

## 2022-05-04 MED ORDER — MIDAZOLAM HCL 2 MG/2ML IJ SOLN
INTRAMUSCULAR | Status: AC
  Filled 2022-05-04: qty 2

## 2022-05-04 MED ORDER — STERILE WATER FOR IRRIGATION IR SOLN
Status: DC | PRN
  Administered 2022-05-04 (×2): 1000 mL

## 2022-05-04 MED ORDER — HYDROMORPHONE HCL 1 MG/ML IJ SOLN
INTRAMUSCULAR | Status: AC
  Filled 2022-05-04: qty 0.5

## 2022-05-04 MED ORDER — HYDROMORPHONE HCL 1 MG/ML IJ SOLN
INTRAMUSCULAR | Status: DC | PRN
  Administered 2022-05-04: .5 mg via INTRAVENOUS

## 2022-05-04 MED ORDER — GABAPENTIN 300 MG PO CAPS
300.0000 mg | ORAL_CAPSULE | ORAL | Status: AC
  Administered 2022-05-04: 300 mg via ORAL
  Filled 2022-05-04: qty 1

## 2022-05-04 MED ORDER — CYCLOBENZAPRINE HCL 5 MG PO TABS
5.0000 mg | ORAL_TABLET | Freq: Three times a day (TID) | ORAL | Status: DC | PRN
  Administered 2022-05-04: 5 mg via ORAL
  Filled 2022-05-04: qty 1

## 2022-05-04 MED ORDER — LIDOCAINE 2% (20 MG/ML) 5 ML SYRINGE
INTRAMUSCULAR | Status: AC
Start: 2022-05-04 — End: ?
  Filled 2022-05-04: qty 5

## 2022-05-04 MED ORDER — PROCHLORPERAZINE EDISYLATE 10 MG/2ML IJ SOLN: 5.0000 mg | Freq: Four times a day (QID) | INTRAMUSCULAR | Status: DC | PRN

## 2022-05-04 MED ORDER — SODIUM CHLORIDE 0.9 % IV SOLN
INTRAVENOUS | Status: DC | PRN
  Administered 2022-05-04: 500 mL
  Administered 2022-05-04: 1000 mL

## 2022-05-04 MED ORDER — PROPOFOL 10 MG/ML IV BOLUS
INTRAVENOUS | Status: DC | PRN
  Administered 2022-05-04: 200 mg via INTRAVENOUS

## 2022-05-04 MED ORDER — LACTATED RINGERS IV SOLN: INTRAVENOUS | Status: DC

## 2022-05-04 MED ORDER — ROPIVACAINE HCL 5 MG/ML IJ SOLN
INTRAMUSCULAR | Status: DC | PRN
  Administered 2022-05-04: 30 mL via PERINEURAL

## 2022-05-04 MED ORDER — CEFAZOLIN SODIUM-DEXTROSE 2-4 GM/100ML-% IV SOLN
2.0000 g | INTRAVENOUS | Status: AC
  Administered 2022-05-04 (×2): 2 g via INTRAVENOUS
  Filled 2022-05-04: qty 100

## 2022-05-04 MED ORDER — KCL IN DEXTROSE-NACL 20-5-0.45 MEQ/L-%-% IV SOLN
INTRAVENOUS | Status: DC
  Filled 2022-05-04 (×2): qty 1000

## 2022-05-04 MED ORDER — KETOROLAC TROMETHAMINE 30 MG/ML IJ SOLN: 30.0000 mg | Freq: Four times a day (QID) | INTRAMUSCULAR | Status: DC | PRN

## 2022-05-04 MED ORDER — MEPERIDINE HCL 25 MG/ML IJ SOLN: 6.2500 mg | INTRAMUSCULAR | Status: DC | PRN

## 2022-05-04 MED ORDER — BUPIVACAINE LIPOSOME 1.3 % IJ SUSP
INTRAMUSCULAR | Status: DC | PRN
  Administered 2022-05-04: 10 mL via PERINEURAL

## 2022-05-04 MED ORDER — MELATONIN 3 MG PO TABS
3.0000 mg | ORAL_TABLET | Freq: Every evening | ORAL | Status: DC | PRN
  Administered 2022-05-04: 3 mg via ORAL
  Filled 2022-05-04: qty 1

## 2022-05-04 MED ORDER — DIPHENHYDRAMINE HCL 12.5 MG/5ML PO ELIX: 12.5000 mg | ORAL_SOLUTION | Freq: Four times a day (QID) | ORAL | Status: DC | PRN

## 2022-05-04 MED ORDER — CEFAZOLIN SODIUM-DEXTROSE 2-4 GM/100ML-% IV SOLN
2.0000 g | Freq: Three times a day (TID) | INTRAVENOUS | Status: AC
  Administered 2022-05-04: 2 g via INTRAVENOUS
  Filled 2022-05-04: qty 100

## 2022-05-04 MED ORDER — DEXAMETHASONE SODIUM PHOSPHATE 10 MG/ML IJ SOLN
INTRAMUSCULAR | Status: AC
Start: 2022-05-04 — End: ?
  Filled 2022-05-04: qty 1

## 2022-05-04 MED ORDER — KETOROLAC TROMETHAMINE 30 MG/ML IJ SOLN
30.0000 mg | Freq: Three times a day (TID) | INTRAMUSCULAR | Status: AC
  Administered 2022-05-04 – 2022-05-05 (×3): 30 mg via INTRAVENOUS
  Filled 2022-05-04 (×3): qty 1

## 2022-05-04 MED ORDER — ORAL CARE MOUTH RINSE: 15.0000 mL | Freq: Once | OROMUCOSAL | Status: AC

## 2022-05-04 MED ORDER — AMISULPRIDE (ANTIEMETIC) 5 MG/2ML IV SOLN: 10.0000 mg | Freq: Once | INTRAVENOUS | Status: DC | PRN

## 2022-05-04 MED ORDER — MIDAZOLAM HCL 2 MG/2ML IJ SOLN
INTRAMUSCULAR | Status: DC | PRN
  Administered 2022-05-04: 2 mg via INTRAVENOUS

## 2022-05-04 MED ORDER — CHLORHEXIDINE GLUCONATE 0.12 % MT SOLN
15.0000 mL | Freq: Once | OROMUCOSAL | Status: AC
  Administered 2022-05-04: 15 mL via OROMUCOSAL
  Filled 2022-05-04: qty 15

## 2022-05-04 MED ORDER — KETOROLAC TROMETHAMINE 30 MG/ML IJ SOLN
30.0000 mg | Freq: Once | INTRAMUSCULAR | Status: AC | PRN
  Administered 2022-05-04: 30 mg via INTRAVENOUS

## 2022-05-04 MED ORDER — PHENYLEPHRINE HCL-NACL 20-0.9 MG/250ML-% IV SOLN
INTRAVENOUS | Status: DC | PRN
  Administered 2022-05-04: 20 ug/min via INTRAVENOUS

## 2022-05-04 MED ORDER — METHOCARBAMOL 500 MG PO TABS: 500.0000 mg | ORAL_TABLET | Freq: Three times a day (TID) | ORAL | Status: DC | PRN

## 2022-05-04 MED ORDER — FENTANYL CITRATE (PF) 250 MCG/5ML IJ SOLN
INTRAMUSCULAR | Status: DC | PRN
Start: 2022-05-04 — End: 2022-05-04
  Administered 2022-05-04: 100 ug via INTRAVENOUS
  Administered 2022-05-04 (×3): 50 ug via INTRAVENOUS

## 2022-05-04 MED ORDER — KETOROLAC TROMETHAMINE 30 MG/ML IJ SOLN
INTRAMUSCULAR | Status: AC
Start: 2022-05-04 — End: ?
  Filled 2022-05-04: qty 1

## 2022-05-04 MED ORDER — PROCHLORPERAZINE MALEATE 10 MG PO TABS: 10.0000 mg | ORAL_TABLET | Freq: Four times a day (QID) | ORAL | Status: DC | PRN

## 2022-05-04 MED ORDER — DEXAMETHASONE SODIUM PHOSPHATE 10 MG/ML IJ SOLN
INTRAMUSCULAR | Status: DC | PRN
  Administered 2022-05-04: 10 mg via INTRAVENOUS

## 2022-05-04 MED ORDER — PHENYLEPHRINE 80 MCG/ML (10ML) SYRINGE FOR IV PUSH (FOR BLOOD PRESSURE SUPPORT)
PREFILLED_SYRINGE | INTRAVENOUS | Status: AC
  Filled 2022-05-04: qty 10

## 2022-05-04 MED ORDER — PROPOFOL 10 MG/ML IV BOLUS
INTRAVENOUS | Status: AC
  Filled 2022-05-04: qty 20

## 2022-05-04 MED ORDER — ENSURE PRE-SURGERY PO LIQD: 296.0000 mL | Freq: Once | ORAL | Status: DC

## 2022-05-04 MED ORDER — HYDROMORPHONE HCL 1 MG/ML IJ SOLN: 0.5000 mg | INTRAMUSCULAR | Status: DC | PRN

## 2022-05-04 MED ORDER — ACETAMINOPHEN 500 MG PO TABS
1000.0000 mg | ORAL_TABLET | Freq: Once | ORAL | Status: AC
  Administered 2022-05-04: 1000 mg via ORAL
  Filled 2022-05-04: qty 2

## 2022-05-04 MED ORDER — ROCURONIUM BROMIDE 10 MG/ML (PF) SYRINGE
PREFILLED_SYRINGE | INTRAVENOUS | Status: DC | PRN
  Administered 2022-05-04: 50 mg via INTRAVENOUS
  Administered 2022-05-04: 20 mg via INTRAVENOUS
  Administered 2022-05-04: 30 mg via INTRAVENOUS
  Administered 2022-05-04: 100 mg via INTRAVENOUS
  Administered 2022-05-04: 20 mg via INTRAVENOUS

## 2022-05-04 MED ORDER — SIMETHICONE 80 MG PO CHEW: 40.0000 mg | CHEWABLE_TABLET | Freq: Four times a day (QID) | ORAL | Status: DC | PRN

## 2022-05-04 MED ORDER — OXYCODONE HCL 5 MG PO TABS
5.0000 mg | ORAL_TABLET | Freq: Four times a day (QID) | ORAL | Status: DC | PRN
  Administered 2022-05-05: 5 mg via ORAL
  Filled 2022-05-04 (×2): qty 1

## 2022-05-04 MED ORDER — TECHNETIUM TC 99M TILMANOCEPT KIT
1.0000 | PACK | Freq: Once | INTRAVENOUS | Status: AC | PRN
  Administered 2022-05-04: 1 via INTRADERMAL

## 2022-05-04 MED ORDER — SENNA 8.6 MG PO TABS
1.0000 | ORAL_TABLET | Freq: Two times a day (BID) | ORAL | Status: DC
Start: 2022-05-04 — End: 2022-05-05
  Administered 2022-05-04 – 2022-05-05 (×2): 8.6 mg via ORAL
  Filled 2022-05-04 (×2): qty 1

## 2022-05-04 MED ORDER — OXYCODONE HCL 5 MG PO TABS: 5.0000 mg | ORAL_TABLET | Freq: Once | ORAL | Status: DC | PRN

## 2022-05-04 MED ORDER — ACETAMINOPHEN 500 MG PO TABS
1000.0000 mg | ORAL_TABLET | Freq: Four times a day (QID) | ORAL | Status: DC
  Administered 2022-05-04 – 2022-05-05 (×3): 1000 mg via ORAL
  Filled 2022-05-04 (×3): qty 2

## 2022-05-04 MED ORDER — ONDANSETRON HCL 4 MG/2ML IJ SOLN
INTRAMUSCULAR | Status: AC
  Filled 2022-05-04: qty 2

## 2022-05-04 MED ORDER — ONDANSETRON HCL 4 MG PO TABS: 4.0000 mg | ORAL_TABLET | Freq: Three times a day (TID) | ORAL | Status: DC | PRN

## 2022-05-04 MED ORDER — LIDOCAINE 2% (20 MG/ML) 5 ML SYRINGE
INTRAMUSCULAR | Status: DC | PRN
  Administered 2022-05-04: 40 mg via INTRAVENOUS

## 2022-05-04 SURGICAL SUPPLY — 79 items
BAG COUNTER SPONGE SURGICOUNT (BAG) ×6 IMPLANT
BAG DECANTER FOR FLEXI CONT (MISCELLANEOUS) ×3 IMPLANT
BENZOIN TINCTURE PRP APPL 2/3 (GAUZE/BANDAGES/DRESSINGS) ×2 IMPLANT
BINDER BREAST LRG (GAUZE/BANDAGES/DRESSINGS) ×1 IMPLANT
BINDER BREAST XLRG (GAUZE/BANDAGES/DRESSINGS) ×1 IMPLANT
BIOPATCH RED 1 DISK 7.0 (GAUZE/BANDAGES/DRESSINGS) ×9 IMPLANT
BNDG COHESIVE 4X5 TAN STRL (GAUZE/BANDAGES/DRESSINGS) ×3 IMPLANT
CANISTER SUCT 3000ML PPV (MISCELLANEOUS) ×6 IMPLANT
CHLORAPREP W/TINT 10.5 ML (MISCELLANEOUS) ×3 IMPLANT
CHLORAPREP W/TINT 26 (MISCELLANEOUS) ×6 IMPLANT
CLIP VESOCCLUDE LG 6/CT (CLIP) ×3 IMPLANT
CLIP VESOCCLUDE MED 6/CT (CLIP) ×3 IMPLANT
CLIP VESOCCLUDE SM WIDE 6/CT (CLIP) ×3 IMPLANT
CNTNR URN SCR LID CUP LEK RST (MISCELLANEOUS) ×2 IMPLANT
CONT SPEC 4OZ STRL OR WHT (MISCELLANEOUS) ×1
COVER PROBE W GEL 5X96 (DRAPES) ×4 IMPLANT
COVER SURGICAL LIGHT HANDLE (MISCELLANEOUS) ×6 IMPLANT
DERMABOND ADVANCED (GAUZE/BANDAGES/DRESSINGS) ×2
DERMABOND ADVANCED .7 DNX12 (GAUZE/BANDAGES/DRESSINGS) ×4 IMPLANT
DRAIN CHANNEL 15F RND FF W/TCR (WOUND CARE) ×4 IMPLANT
DRAPE HALF SHEET 40X57 (DRAPES) ×6 IMPLANT
DRESSING MEPILEX FLEX 4X4 (GAUZE/BANDAGES/DRESSINGS) IMPLANT
DRSG MEPILEX FLEX 4X4 (GAUZE/BANDAGES/DRESSINGS) ×6
DRSG PAD ABDOMINAL 8X10 ST (GAUZE/BANDAGES/DRESSINGS) ×7 IMPLANT
DRSG TEGADERM 4X4.75 (GAUZE/BANDAGES/DRESSINGS) ×6 IMPLANT
ELECT BLADE 4.0 EZ CLEAN MEGAD (MISCELLANEOUS) ×9
ELECT CAUTERY BLADE 6.4 (BLADE) ×3 IMPLANT
ELECT REM PT RETURN 9FT ADLT (ELECTROSURGICAL) ×6
ELECTRODE BLDE 4.0 EZ CLN MEGD (MISCELLANEOUS) ×2 IMPLANT
ELECTRODE REM PT RTRN 9FT ADLT (ELECTROSURGICAL) ×4 IMPLANT
EVACUATOR SILICONE 100CC (DRAIN) ×8 IMPLANT
EXPANDER BREAST ALLOX2 600-720 (Breast) ×2 IMPLANT
GAUZE SPONGE 4X4 12PLY STRL (GAUZE/BANDAGES/DRESSINGS) ×3 IMPLANT
GLOVE BIO SURGEON STRL SZ 6 (GLOVE) ×3 IMPLANT
GLOVE BIO SURGEON STRL SZ7.5 (GLOVE) ×3 IMPLANT
GLOVE BIOGEL PI IND STRL 7.5 (GLOVE) ×2 IMPLANT
GLOVE BIOGEL PI INDICATOR 7.5 (GLOVE) ×1
GLOVE INDICATOR 6.5 STRL GRN (GLOVE) ×3 IMPLANT
GOWN STRL REUS W/ TWL LRG LVL3 (GOWN DISPOSABLE) ×6 IMPLANT
GOWN STRL REUS W/TWL 2XL LVL3 (GOWN DISPOSABLE) ×3 IMPLANT
GOWN STRL REUS W/TWL LRG LVL3 (GOWN DISPOSABLE) ×3
GOWN STRL REUS W/TWL XL LVL3 (GOWN DISPOSABLE) ×3 IMPLANT
GRAFT FLEX HD 19X22X0.7-1.4 (Tissue) ×2 IMPLANT
KIT BASIN OR (CUSTOM PROCEDURE TRAY) ×6 IMPLANT
KIT FILL ASEPTIC TRANSFER (MISCELLANEOUS) ×2 IMPLANT
KIT TURNOVER KIT B (KITS) ×5 IMPLANT
LIGHT WAVEGUIDE WIDE FLAT (MISCELLANEOUS) ×3 IMPLANT
MARKER SKIN DUAL TIP RULER LAB (MISCELLANEOUS) ×7 IMPLANT
NDL 18GX1X1/2 (RX/OR ONLY) (NEEDLE) IMPLANT
NDL HYPO 25GX1X1/2 BEV (NEEDLE) ×4 IMPLANT
NEEDLE 18GX1X1/2 (RX/OR ONLY) (NEEDLE) ×3 IMPLANT
NEEDLE HYPO 25GX1X1/2 BEV (NEEDLE) ×3 IMPLANT
NS IRRIG 1000ML POUR BTL (IV SOLUTION) ×9 IMPLANT
PACK GENERAL/GYN (CUSTOM PROCEDURE TRAY) ×5 IMPLANT
PACK SPY-PHI (KITS) ×1 IMPLANT
PACK UNIVERSAL I (CUSTOM PROCEDURE TRAY) ×3 IMPLANT
PAD ARMBOARD 7.5X6 YLW CONV (MISCELLANEOUS) ×9 IMPLANT
PENCIL SMOKE EVACUATOR (MISCELLANEOUS) ×3 IMPLANT
PIN SAFETY STERILE (MISCELLANEOUS) ×3 IMPLANT
SPECIMEN JAR X LARGE (MISCELLANEOUS) ×3 IMPLANT
SPIKE FLUID TRANSFER (MISCELLANEOUS) ×6 IMPLANT
STAPLER VISISTAT 35W (STAPLE) ×5 IMPLANT
STOCKINETTE IMPERVIOUS 9X36 MD (GAUZE/BANDAGES/DRESSINGS) ×3 IMPLANT
STRIP CLOSURE SKIN 1/2X4 (GAUZE/BANDAGES/DRESSINGS) ×4 IMPLANT
SUT ETHILON 2 0 FS 18 (SUTURE) ×2 IMPLANT
SUT MNCRL AB 4-0 PS2 18 (SUTURE) ×1 IMPLANT
SUT PDS AB 2-0 CT1 27 (SUTURE) ×2 IMPLANT
SUT PDS AB 2-0 CT2 27 (SUTURE) ×7 IMPLANT
SUT PDS AB 3-0 SH 27 (SUTURE) ×4 IMPLANT
SUT SILK 2 0 (SUTURE) ×1
SUT SILK 2 0 PERMA HAND 18 BK (SUTURE) ×6 IMPLANT
SUT SILK 2-0 18XBRD TIE 12 (SUTURE) ×2 IMPLANT
SUT VIC AB 3-0 SH 8-18 (SUTURE) ×3 IMPLANT
SUT VLOC 90 P-14 23 (SUTURE) ×2 IMPLANT
SYR CONTROL 10ML LL (SYRINGE) ×5 IMPLANT
TOWEL GREEN STERILE (TOWEL DISPOSABLE) ×5 IMPLANT
TOWEL GREEN STERILE FF (TOWEL DISPOSABLE) ×6 IMPLANT
TRACER MAGTRACE VIAL (MISCELLANEOUS) ×1 IMPLANT
TRAY FOLEY MTR SLVR 14FR STAT (SET/KITS/TRAYS/PACK) ×1 IMPLANT

## 2022-05-04 NOTE — Anesthesia Procedure Notes (Signed)
Procedure Name: Intubation Date/Time: 05/04/2022 7:55 AM  Performed by: Dorann Lodge, CRNAPre-anesthesia Checklist: Patient identified, Emergency Drugs available, Suction available and Patient being monitored Patient Re-evaluated:Patient Re-evaluated prior to induction Oxygen Delivery Method: Circle System Utilized Preoxygenation: Pre-oxygenation with 100% oxygen Induction Type: IV induction Ventilation: Mask ventilation without difficulty Laryngoscope Size: Mac and 3 Grade View: Grade I Tube type: Oral Tube size: 7.0 mm Number of attempts: 1 Airway Equipment and Method: Stylet Placement Confirmation: ETT inserted through vocal cords under direct vision, positive ETCO2 and breath sounds checked- equal and bilateral Secured at: 21 cm Tube secured with: Tape Dental Injury: Teeth and Oropharynx as per pre-operative assessment

## 2022-05-04 NOTE — Transfer of Care (Signed)
Immediate Anesthesia Transfer of Care Note  Patient: Monica [name]  Procedure(s) Performed: BILATERAL SKIN SPARING MASTECTOMIES, RIGHT SEED LOCALIZED LYMPH NODE BIOPSY, RIGHT SENTINEL NODE BIOPSY (Bilateral: Breast) PORT REMOVAL (Left: Chest) BREAST RECONSTRUCTION WITH PLACEMENT OF TISSUE EXPANDER AND FLEX HD (ACELLULAR HYDRATED DERMIS) (Bilateral: Breast)  Patient Location: PACU  Anesthesia Type:General  Level of Consciousness: drowsy  Airway & Oxygen Therapy: Patient Spontanous Breathing  Post-op Assessment: Report given to RN and Post -op Vital signs reviewed and stable  Post vital signs: Reviewed and stable  Last Vitals:  Vitals Value Taken Time  BP 96/64 05/04/22 1338  Temp    Pulse 76 05/04/22 1340  Resp 14 05/04/22 1340  SpO2 96 % 05/04/22 1340  Vitals shown include unvalidated device data.  Last Pain:  Vitals:   05/04/22 0621  TempSrc:   PainSc: 0-No pain      Patients Stated Pain Goal: 1 (38/32/91 [id number])  Complications: No notable events documented.

## 2022-05-04 NOTE — Anesthesia Postprocedure Evaluation (Signed)
Anesthesia Post Note  Patient: Monica [name]  Procedure(s) Performed: BILATERAL SKIN SPARING MASTECTOMIES, RIGHT SEED LOCALIZED LYMPH NODE BIOPSY, RIGHT SENTINEL NODE BIOPSY (Bilateral: Breast) PORT REMOVAL (Left: Chest) BREAST RECONSTRUCTION WITH PLACEMENT OF TISSUE EXPANDER AND FLEX HD (ACELLULAR HYDRATED DERMIS) (Bilateral: Breast)     Anesthesia Post Evaluation No notable events documented.  Last Vitals:  Vitals:   05/04/22 1400 05/04/22 1415  BP: 99/74 99/71  Pulse: 75 71  Resp: 15 14  Temp:    SpO2: 98% 99%    Last Pain:  Vitals:   05/04/22 1400  TempSrc:   PainSc: [facility]

## 2022-05-04 NOTE — Anesthesia Procedure Notes (Signed)
Anesthesia Regional Block: Pectoralis block   Pre-Anesthetic Checklist: , timeout performed,  Correct Patient, Correct Site, Correct Laterality,  Correct Procedure, Correct Position, site marked,  Risks and benefits discussed,  Surgical consent,  Pre-op evaluation,  At surgeon's request and post-op pain management  Laterality: Left and Right  Prep: Maximum Sterile Barrier Precautions used, chloraprep       Needles:  Injection technique: Single-shot  Needle Type: Echogenic Stimulator Needle     Needle Length: 9cm  Needle Gauge: 22     Additional Needles:   Procedures:,,,, ultrasound used (permanent image in chart),,    Narrative:  Start time: 05/04/2022 7:00 AM End time: 05/04/2022 7:05 AM Injection made incrementally with aspirations every 5 mL.  Performed by: Personally  Anesthesiologist: Pervis Hocking, DO  Additional Notes: Monitors applied. No increased pain on injection. No increased resistance to injection. Injection made in 5cc increments. Good needle visualization. Patient tolerated procedure well.

## 2022-05-04 NOTE — Anesthesia Preprocedure Evaluation (Addendum)
Anesthesia Evaluation  Patient identified by MRN, date of birth, ID band Patient awake    Reviewed: Allergy & Precautions, NPO status , Patient's Chart, lab work & pertinent test results  History of Anesthesia Complications Negative for: history of anesthetic complications  Airway Mallampati: II  TM Distance: >3 FB Neck ROM: Full    Dental no notable dental hx.    Pulmonary neg pulmonary ROS,    Pulmonary exam normal breath sounds clear to auscultation       Cardiovascular negative cardio ROS Normal cardiovascular exam Rhythm:Regular Rate:Normal     Neuro/Psych negative neurological ROS  negative psych ROS   GI/Hepatic negative GI ROS, Neg liver ROS,   Endo/Other  negative endocrine ROS  Renal/GU negative Renal ROS  negative genitourinary   Musculoskeletal negative musculoskeletal ROS (+)   Abdominal   Peds  Hematology negative hematology ROS (+)   Anesthesia Other Findings RIGHT BREAST CANCER  Reproductive/Obstetrics negative OB ROS                            Anesthesia Physical Anesthesia Plan  ASA: 2  Anesthesia Plan: General   Post-op Pain Management: Tylenol PO (pre-op)*, Regional block* and Toradol IV (intra-op)*   Induction: Intravenous  PONV Risk Score and Plan: 3 and Treatment may vary due to age or medical condition, Midazolam, Dexamethasone, Ondansetron and Scopolamine patch - Pre-op  Airway Management Planned: Oral ETT  Additional Equipment: None  Intra-op Plan:   Post-operative Plan: Extubation in OR  Informed Consent: I have reviewed the patients History and Physical, chart, labs and discussed the procedure including the risks, benefits and alternatives for the proposed anesthesia with the patient or authorized representative who has indicated his/her understanding and acceptance.     Dental advisory given  Plan Discussed with: CRNA  Anesthesia Plan  Comments:       Anesthesia Quick Evaluation

## 2022-05-04 NOTE — Interval H&P Note (Signed)
History and Physical Interval Note:  05/04/2022 7:36 AM  Monica [name]  has presented today for surgery, with the diagnosis of RIGHT BREAST CANCER, BRCA 2 POSITIVE.  The various methods of treatment have been discussed with the patient and family. After consideration of risks, benefits and other options for treatment, the patient has consented to  Procedure(s) with comments: BILATERAL SKIN SPARING MASTECTOMIES, RIGHT SEED LOCALIZED LYMPH NODE BIOPSY, RIGHT SENTINEL NODE BIOPSY (Bilateral) - GEN & PEC BLOCK PORT REMOVAL (N/A) BREAST RECONSTRUCTION WITH PLACEMENT OF TISSUE EXPANDER AND FLEX HD (ACELLULAR HYDRATED DERMIS) (Bilateral) as a surgical intervention.  The patient's history has been reviewed, patient examined, no change in status, stable for surgery.  I have reviewed the patient's chart and labs.  Questions were answered to the patient's satisfaction.     Stark Klein

## 2022-05-04 NOTE — Op Note (Addendum)
Operative Note   DATE OF OPERATION: 05/04/2022  SURGICAL DEPARTMENT: Plastic Surgery  PREOPERATIVE DIAGNOSES:  right breast cancer, BRCA2 defect, s/p bilateral mastectomy today  POSTOPERATIVE DIAGNOSES:  same  PROCEDURE:   1) Bilateral breast reconstruction with tissue expanders and alloderm prepectoral 600-720 ml volume AlloX2 expanders filled to 400 ml. 2) Injection of indocyanine green for visualization of bilateral mastectomy flaps.    SURGEON: Melene Plan. Arihanna Estabrook, MD  ASSISTANT: Lenn Sink, PA  ANESTHESIA:  General.   COMPLICATIONS: None.   INDICATIONS FOR PROCEDURE:  The patient, Monica [name] is a 26 y.o. female born on 1996-02-28, is here for treatment of right breast cancer and BRCA 2 defect.  Patient had mastectomy bilaterally with Dr. Barry Dienes.  MRN: [id number]  CONSENT:  Informed consent was obtained directly from the patient. Risks, benefits and alternatives were fully discussed. Specific risks including but not limited to bleeding, infection, hematoma, seroma, scarring, pain, contracture, asymmetry, wound healing problems, and need for further surgery were all discussed. The patient did have an ample opportunity to have questions answered to satisfaction.   DESCRIPTION OF PROCEDURE:  The patient was taken to the operating room. SCDs were placed and preoperative antibiotics were given. General anesthesia was administered.  The patient's operative site was prepped and draped in a sterile fashion. A time out was performed and all information was confirmed to be correct.  The chest was reprepped with betadine prior to starting my part of the procedure.  Bilateral mastectomies were performed by Dr. Barry Dienes.  The left breast specimen weighted 885 g and the right breast specimen weighed 642 g.  Skin was visualized using SPY technology that showed flaps to be viable after injection of indocyanine green.  AlloX2-FH15E, SN [id number] ws placed on the left after being wrapped with  FlexHD.  On the left we used AlloX2-FH15E, SN [id number].  Flex HD was sutured the tabs.  The pockets were washed with antibiotic irrigation.  Starting with the left the tissue expander was inserted into the pocket.  The inferior tabs were secured along with the Flex adjacent to the superior tab.  Sutures were placed to secure the tissue expander in place in a prepectoral position.  Two 15 Fr Blake drains were placed.  One was placed laterally in a dual plane above and below the flex.  A second was placed circumferentially around the tissue expander.    On the right  side a similar procedure was performed.   Two 15 Fr Blake drains were placed similarly but the lateral drain was also extended into the right axillary pocket where the sentinel nodes were removed. Flex HD was sutured the tabs.  The pockets were washed with antibiotic irrigation.  The tissue expander was inserted into the pocket.  The inferior tabs were secured along with the Flex adjacent to the superior tab.  Sutures were placed to secure the tissue expander in place in a prepectoral position.    Tissue expanders were filled with saline to 400 ml which allowed minimal tension on the skin incisions but good contact between the Flex HD and the underside of the skin flaps.  Wounds were then closed with 3-0 PDS for dermal sutres and V-lock 90 for subcuticular.  Steri strips followed by a dry sterile dressing were placed on bilateral dressings and drain sponges were placed.  The advanced practice practitioner (APP) assisted throughout the case.  The APP was essential in retraction and counter traction when needed to make the case progress smoothly.  This  retraction and assistance made it possible to see the tissue planes for the procedure.  The assistance was needed for hemostasis, tissue re-approximation and closure of the incision site.    The patient tolerated the procedure well.  There were no complications. The patient was allowed to wake from  anesthesia, extubated and taken to the recovery room in satisfactory condition.

## 2022-05-04 NOTE — Op Note (Signed)
Bilateral Mastectomies, right axillary seed localized excisional lymph node biopsy, right sentinel node biopsy (followed by immediate expander based reconstruction by Dr. Erin Hearing)  Indications: This patient presents with history of right breast cancer and BRCA 2 defect  Pre-operative Diagnosis: right breast cancer, cT2N1M0, upper outer quadrant, receptors +/+/-   Post-operative Diagnosis: same  Surgeon: Stark Klein   Anesthesia: General endotracheal anesthesia and pectoral block  ASA Class: 2  Procedure Details  The patient was seen in the Holding Room. The risks, benefits, complications, treatment options, and expected outcomes were discussed with the patient. The possibilities of reaction to medication, pulmonary aspiration, bleeding, infection, the need for additional procedures, failure to diagnose a condition, and creating a complication requiring transfusion or operation were discussed with the patient. The patient concurred with the proposed plan, giving informed consent.  The site of surgery properly noted/marked. The patient was taken to Operating Room # 2, identified as Monica [name] and the procedure verified as Bilateral Mastectomies, right sentinel node biopsy, right seed localized excisional lymph node biopsy. After induction of anesthesia, the right arm, bilateral breast, and chest were prepped and draped in standard fashion.   A Time Out was held and the above information confirmed.  The MagTrace was injected in the subareolar location.    The borders of the breast were identified and marked.  Teardrop incisions incorporating the nipples and extending laterally were drawn out. The left side was addressed first.  The incision was made with the #10 blade.  Mastectomy hooks were used to provide elevation of the skin edges, and the cautery was used to create the mastectomy flaps.  The dissection was taken to the fascia of the pectoralis major.  The penetrating vessels were clipped as  needed.  The superior flap was taken medially to the lateral sternal border, superiorly to the inferior border of the clavicle.  A small buttonhole defect was made with the cautery in the axilla.  The inferior flap was similarly created, inferiorly to the inframammary fold and laterally to the border of the latissimus.  The breast was taken off including the pectoralis fascia and the axillary tail marked.    The right side was then addressed similarly. Once the right breast was out, the right axilla was addressed.  Using a hand-held gamma probe, axillary sentinel nodes were identified.  Four deep level 2 axillary sentinel nodes were removed and submitted to pathology.  SLN #3 contained the seed.  A 5th node was found with the MagTrace probe.  The findings are below.  The lymphovascular channels were clipped with metal clips.        The wound was irrigated. Hemostasis was achieved with cautery.   The patient was left with Dr. Erin Hearing for reconstruction.     Findings: grossly clear surgical margins SLN #1 hot, + magtrace SLN #2 hot, - magtrace SLN #3 palpable, seed containing, + magtrace SLN #4 hot, + magtrace SLN #4 + magtrace only.   Estimated Blood Loss: <50 mL          Drains:  per Dr. Erin Hearing                Specimens: left breast, right breast and five deep axillary sentinel nodes         Complications:  None; patient tolerated the procedure well.         Disposition: PACU - hemodynamically stable.         Condition: stable

## 2022-05-05 ENCOUNTER — Encounter (HOSPITAL_COMMUNITY): Payer: Self-pay | Admitting: General Surgery

## 2022-05-05 DIAGNOSIS — C50411 Malignant neoplasm of upper-outer quadrant of right female breast: Secondary | ICD-10-CM | POA: Diagnosis not present

## 2022-05-05 LAB — CBC
HCT: 30.5 % — ABNORMAL LOW (ref 36.0–46.0)
Hemoglobin: 10.7 g/dL — ABNORMAL LOW (ref 12.0–15.0)
MCH: 32.9 pg (ref 26.0–34.0)
MCHC: 35.1 g/dL (ref 30.0–36.0)
MCV: 93.8 fL (ref 80.0–100.0)
Platelets: 190 10*3/uL (ref 150–400)
RBC: 3.25 MIL/uL — ABNORMAL LOW (ref 3.87–5.11)
RDW: 11.6 % (ref 11.5–15.5)
WBC: 7 10*3/uL (ref 4.0–10.5)
nRBC: 0 % (ref 0.0–0.2)

## 2022-05-05 MED ORDER — OXYCODONE HCL 5 MG PO TABS: 5.0000 mg | ORAL_TABLET | Freq: Four times a day (QID) | ORAL | 0 refills | Status: AC | PRN

## 2022-05-05 MED ORDER — GABAPENTIN 100 MG PO CAPS: 200.0000 mg | ORAL_CAPSULE | Freq: Two times a day (BID) | ORAL | 1 refills | Status: DC

## 2022-05-05 MED ORDER — CYCLOBENZAPRINE HCL 5 MG PO TABS: 5.0000 mg | ORAL_TABLET | Freq: Three times a day (TID) | ORAL | 1 refills | Status: DC | PRN

## 2022-05-05 MED ORDER — SENNA 8.6 MG PO TABS: 1.0000 | ORAL_TABLET | Freq: Two times a day (BID) | ORAL | 0 refills | Status: DC

## 2022-05-05 NOTE — Progress Notes (Signed)
POD#1 bilateral mastectomies with breast reconstruction with placement of tissue expanders and Flex HD by Dr. Erin Hearing in conjunction with Dr. Barry Dienes.   The patient is doing well this morning with minimal pain  Flaps warm and well-perfused with intact cap refill, no areas of fluctuance or noted hematomas, no surrounding redness warmth or discharge, incision clean dry and intact-drains with serosanguineous output (244cc)  Overall the patient is doing well, from our perspective okay for discharge home and outpatient follow-up once general surgery is okay with discharge.

## 2022-05-05 NOTE — Discharge Instructions (Signed)
[facility] [phone]  MASTECTOMY: POST OP INSTRUCTIONS  Always review your discharge instruction sheet given to you by the facility where your surgery was performed. IF YOU HAVE DISABILITY OR FAMILY LEAVE FORMS, YOU MUST BRING THEM TO THE OFFICE FOR PROCESSING.   DO NOT GIVE THEM TO YOUR DOCTOR. A prescription for pain medication may be given to you upon discharge.  Take your pain medication as prescribed, if needed.  If narcotic pain medicine is not needed, then you may take acetaminophen (Tylenol) or ibuprofen (Advil) as needed. Take your usually prescribed medications unless otherwise directed. If you need a refill on your pain medication, please contact your pharmacy.  They will contact our office to request authorization.  Prescriptions will not be filled after 5pm or on week-ends. You should follow a light diet the first few days after arrival home, such as soup and crackers, etc.  Resume your normal diet the day after surgery. Most patients will experience some swelling and bruising on the chest and underarm.  Ice packs will help.  Swelling and bruising can take several days to resolve.  It is common to experience some constipation if taking pain medication after surgery.  Increasing fluid intake and taking a stool softener (such as Colace) will usually help or prevent this problem from occurring.  A mild laxative (Milk of Magnesia or Miralax) should be taken according to package instructions if there are no bowel movements after 48 hours. Unless discharge instructions indicate otherwise, leave your bandage dry and in place until your next appointment in 3-5 days.  You may take a limited sponge bath.  No tube baths or showers until the drains are removed.  You may have steri-strips (small skin tapes) in place directly over the incision.  These strips should be left on the skin for 7-10 days.  If your surgeon used skin glue on the incision, you may shower in 24 hours.   The glue will flake off over the next 2-3 weeks.  Any sutures or staples will be removed at the office during your follow-up visit. DRAINS:  If you have drains in place, it is important to keep a list of the amount of drainage produced each day in your drains.  Before leaving the hospital, you should be instructed on drain care.  Call our office if you have any questions about your drains. ACTIVITIES:  You may resume regular (light) daily activities beginning the next day--such as daily self-care, walking, climbing stairs--gradually increasing activities as tolerated.  You may have sexual intercourse when it is comfortable.  Refrain from any heavy lifting or straining until approved by your doctor. You may drive when you are no longer taking prescription pain medication, you can comfortably wear a seatbelt, and you can safely maneuver your car and apply brakes. RETURN TO WORK:  __________________________________________________________ You should see your doctor in the office for a follow-up appointment approximately 3-5 days after your surgery.  Your doctor's nurse will typically make your follow-up appointment when she calls you with your pathology report.  Expect your pathology report 2-3 business days after your surgery.  You may call to check if you do not hear from us after three days.   OTHER INSTRUCTIONS: ______________________________________________________________________________________________ ____________________________________________________________________________________________ WHEN TO CALL YOUR DOCTOR: Fever over 101.0 Nausea and/or vomiting Extreme swelling or bruising Continued bleeding from incision. Increased pain, redness, or drainage from the incision. The clinic staff is available to answer your questions during regular business hours.  Please don't hesitate   to call and ask to speak to one of the nurses for clinical concerns.  If you have a medical emergency, go to the  nearest emergency room or call 911.  A surgeon from [facility] is always on call at the hospital. [address], [address], [place], [place]  [address] ? [address] [address], [place], [place]   [address] [phone] ? [phone] ? FAX [phone] Web site: [email or web address]  

## 2022-05-05 NOTE — Discharge Summary (Signed)
Physician Discharge Summary  Patient ID: Monica [name] MRN: [id number] DOB/AGE: [birth year] 26 y.o.  Admit date: 05/04/2022 Discharge date: 05/05/2022  Admission Diagnoses: Patient Active Problem List   Diagnosis Date Noted   Breast cancer ([place]) 05/04/2022   Breast cancer metastasized to axillary lymph node, right ([place]) 05/04/2022   BRCA2 gene mutation positive 01/14/2022   Genetic testing 01/14/2022   Port-A-Cath in place 11/04/2021   Malignant neoplasm of upper-outer quadrant of right breast in female, estrogen receptor positive ([place]) 10/16/2021    Discharge Diagnoses:  Principal Problem:   Breast cancer ([facility]) Active Problems:   Malignant neoplasm of upper-outer quadrant of right breast in female, estrogen receptor positive ([place])   BRCA2 gene mutation positive   Breast cancer metastasized to axillary lymph node, right [facility])   Discharged Condition: stable  Hospital Course:  Pt was admitted to the floor following bilateral mastectomies, right seed loc LN bx and right SLN bx 05/04/2022 in conjunction with expander reconstruction.  She did well overnight with soreness, but not dramatic pain or significant narcotic need.  She was ambulatory and tolerated regular diet.  She was educated on drain care and measurement of output.  She was discharged in stable condition  Consults:  plastic surgery  Significant Diagnostic Studies: labs: HCT am of d/c 30.5  Treatments: surgery: see above  Discharge Exam: Blood pressure 100/69, pulse 73, temperature (!) 97.4 F (36.3 C), temperature source Oral, resp. rate 16, height _0  (1.676 m), weight 64 kg, last menstrual period 07/19/2019, SpO2 100 %. General appearance: alert, cooperative, and no distress Resp: breathing comfortably Chest wall: anticipated bilateral chest wall tenderness, drains serosang Extremities: extremities normal, atraumatic, no cyanosis or edema  Disposition: Discharge disposition: 01-Home or Self  Care      Discharge Instructions     Call MD for:  difficulty breathing, headache or visual disturbances   Complete by: As directed    Call MD for:  hives   Complete by: As directed    Call MD for:  persistant nausea and vomiting   Complete by: As directed    Call MD for:  redness, tenderness, or signs of infection (pain, swelling, redness, odor or green/yellow discharge around incision site)   Complete by: As directed    Call MD for:  severe uncontrolled pain   Complete by: As directed    Call MD for:  temperature >100.4   Complete by: As directed    Change dressing (specify)   Complete by: As directed    Measure and record drain output 2-3 times per day.  Bring record to clinic.   Change gauze if desired, otherwise throw gauze away on POD 3   Diet - low sodium heart healthy   Complete by: As directed    Increase activity slowly   Complete by: As directed       Allergies as of 05/05/2022   No Known Allergies      Medication List     TAKE these medications    acetaminophen 500 MG tablet Commonly known as: TYLENOL Take 1,000 mg by mouth daily as needed for headache or mild pain.   cyclobenzaprine 5 MG tablet Commonly known as: FLEXERIL Take 1 tablet (5 mg total) by mouth 3 (three) times daily as needed for muscle spasms.   gabapentin 100 MG capsule Commonly known as: NEURONTIN Take 2 capsules (200 mg total) by mouth 2 (two) times daily.   ondansetron 4 MG tablet Commonly known as: Zofran Take 1 tablet (  4 mg total) by mouth every 8 (eight) hours as needed for nausea or vomiting.   oxyCODONE 5 MG immediate release tablet Commonly known as: Oxy IR/ROXICODONE Take 1 tablet (5 mg total) by mouth every 6 (six) hours as needed for up to 5 days for severe pain.   senna 8.6 MG Tabs tablet Commonly known as: SENOKOT Take 1 tablet (8.6 mg total) by mouth 2 (two) times daily.   sulfamethoxazole-trimethoprim 800-160 MG tablet Commonly known as: Bactrim DS Take 1  tablet by mouth 2 (two) times daily for 14 days.               Discharge Care Instructions  (From admission, onward)           Start     Ordered   05/05/22 0000  Change dressing (specify)       Comments: Measure and record drain output 2-3 times per day.  Bring record to clinic.   Change gauze if desired, otherwise throw gauze away on POD 3   05/05/22 0853            Follow-up Information     Stark Klein, MD Follow up in 2 week(s).   Specialty: General Surgery Contact information: [address] [place] [address] [phone]         Lennice Sites, MD Follow up in 1 week(s).   Specialty: Plastic Surgery Contact information: [address], [address] [place] [address] [phone]                 Signed: Stark Klein 05/05/2022, 2:06 PM

## 2022-05-06 LAB — SURGICAL PATHOLOGY

## 2022-05-10 ENCOUNTER — Ambulatory Visit (INDEPENDENT_AMBULATORY_CARE_PROVIDER_SITE_OTHER): Payer: Medicaid Other | Admitting: Plastic Surgery

## 2022-05-10 DIAGNOSIS — C50911 Malignant neoplasm of unspecified site of right female breast: Secondary | ICD-10-CM

## 2022-05-10 NOTE — Progress Notes (Signed)
Status post bilateral expander based breast reconstruction.  Patient is doing well without complaints other than some pain.  Physical exam Skin flaps intact, no signs of hematoma or seroma, JP drains up to 40 cc on each side for 24 hours  Assessment and plan Patient is doing well status post tissue expander reconstruction.  Patient will be seen back later this week because she may be ready for drain removal.

## 2022-05-11 ENCOUNTER — Encounter: Payer: Self-pay | Admitting: *Deleted

## 2022-05-11 NOTE — Progress Notes (Signed)
Patient is a pleasant 26 year old female with PMH of right breast cancer s/p bilateral breast reconstruction with tissue expanders and Flex HD performed 05/05/2022 by Dr. Erin Hearing who presents to clinic for postoperative follow-up.  She was last seen here in clinic on 05/10/2022 by Dr. Erin Hearing.  At that time, she was still putting out 40 cc drainage from her bulbs bilaterally and she was asked to return in a few days for possible drain removal.  Reviewed operative report and she has 400/600 cc in each of her expanders at the conclusion of her expander placement surgery.  Today, patient is doing well.  She states that she is still having discomfort at the drain tube insertion sites and is hoping they can be removed today.  She states that she has had less than 10 cc/day from each of her 4 drains x3 days consecutively.  She also states that she does not want any expander fill today and feels as though she is already as large as she would like to be after her implant exchange.  She tells me that she wants to be a full B or small C cup.  Denies any known planned radiation or chemotherapy.  Physical exam is reassuring.  Flaps appear well vascularized.  Steri-Strips remain firmly intact.  Her drains all are intact and functional, normal-appearing drainage in bulbs.  Rigid concavities with the expanders, but no obvious subcutaneous fluid collections or hematomas noted.  No cellulitic changes.  No leg swelling.  Ambulatory.  Drains are removed without complication or difficulty.  Will abstain from expander fill at this time and have patient return in 7 to 10 days for reevaluation and possible expander fill.  Will discuss with Dr. Erin Hearing.  We placed injectable saline in the Expander using a sterile technique: Right: 0 cc for a total of 400 / 600 cc Left: 0 cc for a total of 400 / 600 cc

## 2022-05-14 ENCOUNTER — Ambulatory Visit (INDEPENDENT_AMBULATORY_CARE_PROVIDER_SITE_OTHER): Payer: Medicaid Other | Admitting: Physician Assistant

## 2022-05-14 DIAGNOSIS — Z9889 Other specified postprocedural states: Secondary | ICD-10-CM

## 2022-05-14 NOTE — Progress Notes (Signed)
Patient Care Team: Pcp, No as PCP - General Monica Lose, MD as Consulting Physician (Hematology and Oncology) Monica Rudd, MD as Consulting Physician (Radiation Oncology) Monica Klein, MD as Consulting Physician (General Surgery)  DIAGNOSIS: No diagnosis found.  SUMMARY OF ONCOLOGIC HISTORY: Oncology History  Malignant neoplasm of upper-outer quadrant of right breast in female, estrogen receptor positive ([place])  10/16/2021 Initial Diagnosis   Malignant neoplasm of upper-outer quadrant of right breast in female, estrogen receptor positive ([place])   10/16/2021 Cancer Staging   Staging form: Breast, AJCC 8th Edition - Clinical stage from 10/16/2021: Stage IIA (cT3, cN1, cM0, G2, ER+, PR+, HER2: Equivocal) - Signed by Monica Lose, MD on 10/16/2021 Histologic grading system: 3 grade system   10/28/2021 - 03/11/2022 Chemotherapy   Patient is on Treatment Plan : BREAST ADJUVANT DOSE DENSE AC q14d / PACLitaxel q7d      Genetic Testing   [facility] CancerNext-Expanded identified 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 Report date is 01/21/2022.  The CancerNext-Expanded gene panel offered by [facility] and includes sequencing, rearrangement, and RNA analysis for the following 77 genes: AIP, ALK, APC, ATM, AXIN2, BAP1, BARD1, BLM, BMPR1A, BRCA1, BRCA2, BRIP1, CDC73, CDH1, CDK4, CDKN1B, CDKN2A, CHEK2, CTNNA1, DICER1, FANCC, FH, FLCN, GALNT12, KIF1B, LZTR1, MAX, MEN1, MET, MLH1, MSH2, MSH3, MSH6, MUTYH, NBN, NF1, NF2, NTHL1, PALB2, PHOX2B, PMS2, POT1, PRKAR1A, PTCH1, PTEN, RAD51C, RAD51D, RB1, RECQL, RET, SDHA, SDHAF2, SDHB, SDHC, SDHD, SMAD4, SMARCA4, SMARCB1, SMARCE1, STK11, SUFU, TMEM127, TP53, TSC1, TSC2, VHL and XRCC2 (sequencing and deletion/duplication); EGFR, EGLN1, HOXB13, KIT, MITF, PDGFRA, POLD1, and POLE (sequencing only); EPCAM and GREM1 (deletion/duplication only).      CHIEF COMPLIANT: Follow-up right breast cancer     INTERVAL HISTORY: Monica [name] is a 26 y.o.  with above-mentioned history of ER/PR positive right breast cancer, currently on chemotherapy with cycle 6 Taxol. She presents to the clinic today for treatment.     ALLERGIES:  has No Known Allergies.  MEDICATIONS:  Current Outpatient Medications  Medication Sig Dispense Refill   acetaminophen (TYLENOL) 500 MG tablet Take 1,000 mg by mouth daily as needed for headache or mild pain.     cyclobenzaprine (FLEXERIL) 5 MG tablet Take 1 tablet (5 mg total) by mouth 3 (three) times daily as needed for muscle spasms. 60 tablet 1   gabapentin (NEURONTIN) 100 MG capsule Take 2 capsules (200 mg total) by mouth 2 (two) times daily. 120 capsule 1   ondansetron (ZOFRAN) 4 MG tablet Take 1 tablet (4 mg total) by mouth every 8 (eight) hours as needed for nausea or vomiting. 20 tablet 0   senna (SENOKOT) 8.6 MG TABS tablet Take 1 tablet (8.6 mg total) by mouth 2 (two) times daily. 120 tablet 0   No current facility-administered medications for this visit.    PHYSICAL EXAMINATION: ECOG PERFORMANCE STATUS: {CHL ONC ECOG PS:[phone]}  There were no vitals filed for this visit. There were no vitals filed for this visit.  BREAST:*** No palpable masses or nodules in either right or left breasts. No palpable axillary supraclavicular or infraclavicular adenopathy no breast tenderness or nipple discharge. (exam performed in the presence of a chaperone)  LABORATORY DATA:  I have reviewed the data as listed    Latest Ref Rng & Units 03/18/2022    9:26 AM 03/11/2022    8:35 AM 03/04/2022    9:12 AM  CMP  Glucose 70 - 99 mg/dL 110  104  108   BUN 6 - 20 mg/dL  _0 Creatinine 0.44 - 1.00 mg/dL 0.59  0.64  0.59   Sodium 135 - 145 mmol/L 139  140  140   Potassium 3.5 - 5.1 mmol/L 3.9  3.9  3.9   Chloride 98 - 111 mmol/L 106  106  106   CO2 22 - 32 mmol/L _1 Calcium 8.9 - 10.3 mg/dL 9.9  9.7  9.7   Total Protein 6.5 - 8.1 g/dL 6.8  6.8  7.0   Total Bilirubin 0.3 - 1.2 mg/dL 0.6  0.4  0.3    Alkaline Phos 38 - 126 U/L 31  32  35   AST 15 - 41 U/L _2 ALT 0 - 44 U/L _3 Lab Results  Component Value Date   WBC 7.0 05/05/2022   HGB 10.7 (L) 05/05/2022   HCT 30.5 (L) 05/05/2022   MCV 93.8 05/05/2022   PLT 190 05/05/2022   NEUTROABS 4.6 03/18/2022    ASSESSMENT & PLAN:  No problem-specific Assessment & Plan notes found for this encounter.    No orders of the defined types were placed in this encounter.  The patient has a good understanding of the overall plan. she agrees with it. she will call with any problems that may develop before the next visit here. Total time spent: 30 mins including face to face time and time spent for planning, charting and co-ordination of care   Monica [name], Monica [name] 05/14/22    I Monica [name] am scribing for Dr. Lindi [name]  ***

## 2022-05-17 NOTE — Progress Notes (Deleted)
Patient is a pleasant 26 year old female with PMH of right breast cancer s/p bilateral breast reconstruction with tissue expanders and Flex HD performed 05/05/2022 by Dr. Erin Hearing who presents to clinic for postoperative follow-up.  She was last seen here in clinic on 05/14/2022.  At that time, her exam was reassuring.  JP drains were removed without complication or difficulty.  Decision was made to abstain from expander fill at this time and have patient return in 7 to 10 days for reevaluation and possible expander fill.  She felt at that time that she wants to be a B cup or small C cup and already feels quite expanded at 400/600 cc in each expander.  Today,

## 2022-05-18 ENCOUNTER — Inpatient Hospital Stay: Payer: Medicaid Other | Attending: Hematology and Oncology | Admitting: Hematology and Oncology

## 2022-05-18 ENCOUNTER — Encounter: Payer: Self-pay | Admitting: *Deleted

## 2022-05-18 ENCOUNTER — Other Ambulatory Visit: Payer: Self-pay

## 2022-05-18 DIAGNOSIS — Z79899 Other long term (current) drug therapy: Secondary | ICD-10-CM | POA: Insufficient documentation

## 2022-05-18 DIAGNOSIS — C50411 Malignant neoplasm of upper-outer quadrant of right female breast: Secondary | ICD-10-CM | POA: Insufficient documentation

## 2022-05-18 DIAGNOSIS — Z170 Estrogen receptor positive status [ER+]: Secondary | ICD-10-CM | POA: Diagnosis not present

## 2022-05-18 NOTE — Assessment & Plan Note (Addendum)
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

## 2022-05-19 ENCOUNTER — Telehealth: Payer: Self-pay | Admitting: Radiation Oncology

## 2022-05-19 ENCOUNTER — Encounter (HOSPITAL_COMMUNITY): Payer: Self-pay

## 2022-05-19 NOTE — Telephone Encounter (Signed)
LVM to schedule FUN with Dr. Solon Augusta PA

## 2022-05-20 ENCOUNTER — Ambulatory Visit (INDEPENDENT_AMBULATORY_CARE_PROVIDER_SITE_OTHER): Payer: Medicaid Other | Admitting: Student

## 2022-05-20 ENCOUNTER — Other Ambulatory Visit (HOSPITAL_BASED_OUTPATIENT_CLINIC_OR_DEPARTMENT_OTHER): Payer: Self-pay

## 2022-05-20 DIAGNOSIS — Z9889 Other specified postprocedural states: Secondary | ICD-10-CM

## 2022-05-20 MED ORDER — OXYCODONE HCL 5 MG PO TABS
5.0000 mg | ORAL_TABLET | Freq: Three times a day (TID) | ORAL | 0 refills | Status: DC | PRN
  Filled 2022-05-20: qty 10, 4d supply, fill #0

## 2022-05-20 NOTE — Progress Notes (Signed)
Patient is a pleasant 26 year old female with PMH of right breast cancer s/p bilateral breast reconstruction with tissue expanders and Flex HD performed 05/05/2022 by Dr. Erin Hearing who presents to clinic for postoperative follow-up.  Patient was last seen in the clinic on 05/14/2022.  At this visit, patient was doing well.  There were no signs of hematoma or fluid collections on exam.  Drains were removed at that time.  Today, patient reports she is doing well.  She states she sometimes has some aching pains to the inferior aspect of her breast.  She states this feels like the pain is near her ribs on her left side.  She states she has taken Tylenol and ibuprofen and it does not help with his pain.  Patient denies any other concerns with her surgical site.  She denies any fevers or chills.  Patient does report she has some itchiness underneath her armpit, and some scaling skin.  Patient states that she does not want to undergo an expander fill today, as she would like to stay this as she currently has.  On exam, patient is sitting upright in no acute distress.  Her expanders are in place bilaterally.  There is no sign of hematoma or underlying fluid collections.  Drain sites are healing well.  There is no overlying erythema or ecchymosis to either breast.  Incisions are intact with Steri-Strips.  There is no surrounding erythema or drainage.   There does appear to be some mild irritation and a slightly whitish hue to the skin over the right underarm, and a little bit to her left underarm.  There is no erythema noted.  Discussed with patient that we will hold off on an expander fill today given that she would like to stay her current size.  I discussed with the patient that I will send her a few more days of oxycodone for her pain.  Patient states she took oxycodone after surgery without issue.  Patient states that she is not on any muscle relaxers or sedating medications.  I discussed with the patient  that she should not drive while taking this medication.  Patient acknowledged.  I discussed with the patient that she should continue her compressive bra and to avoid strenuous activities.  I discussed she can continue Vaseline daily to the drain sites.  I discussed with the patient that she could try over-the-counter antifungal cream to her underarm.  I I recommended that she avoid getting the cream near her incision sites.  I also discussed with the patient that if she does not see improvement with her underarm, she should follow-up with her PCP.  Patient expressed understanding.  Patient to follow-up on Monday with Dr. Erin Hearing  Instructed patient to call if she has any questions or concerns.

## 2022-05-21 ENCOUNTER — Encounter: Payer: Medicaid Other | Admitting: Physician Assistant

## 2022-05-24 ENCOUNTER — Encounter: Payer: Self-pay | Admitting: *Deleted

## 2022-05-24 ENCOUNTER — Ambulatory Visit: Payer: Medicaid Other | Admitting: Plastic Surgery

## 2022-05-26 ENCOUNTER — Ambulatory Visit (INDEPENDENT_AMBULATORY_CARE_PROVIDER_SITE_OTHER): Payer: Medicaid Other | Admitting: Plastic Surgery

## 2022-05-26 ENCOUNTER — Encounter: Payer: Self-pay | Admitting: Plastic Surgery

## 2022-05-26 DIAGNOSIS — C50911 Malignant neoplasm of unspecified site of right female breast: Secondary | ICD-10-CM

## 2022-05-27 ENCOUNTER — Other Ambulatory Visit: Payer: Self-pay

## 2022-05-27 ENCOUNTER — Encounter: Payer: Self-pay | Admitting: Radiation Oncology

## 2022-05-27 ENCOUNTER — Ambulatory Visit
Admission: RE | Admit: 2022-05-27 | Discharge: 2022-05-27 | Disposition: A | Discharge status: Home / Self Care | Payer: Medicaid Other | Source: Ambulatory Visit | Attending: Radiation Oncology | Admitting: Radiation Oncology

## 2022-05-27 ENCOUNTER — Other Ambulatory Visit: Payer: Self-pay | Admitting: Radiation Oncology

## 2022-05-27 VITALS — BP 109/75 | HR 92 | Temp 97.5°F | Resp 18 | Ht 66.0 in | Wt 140.4 lb

## 2022-05-27 DIAGNOSIS — C50911 Malignant neoplasm of unspecified site of right female breast: Secondary | ICD-10-CM

## 2022-05-27 DIAGNOSIS — C773 Secondary and unspecified malignant neoplasm of axilla and upper limb lymph nodes: Secondary | ICD-10-CM | POA: Diagnosis not present

## 2022-05-27 DIAGNOSIS — C50411 Malignant neoplasm of upper-outer quadrant of right female breast: Secondary | ICD-10-CM | POA: Insufficient documentation

## 2022-05-27 DIAGNOSIS — Z79899 Other long term (current) drug therapy: Secondary | ICD-10-CM | POA: Diagnosis not present

## 2022-05-27 DIAGNOSIS — Z170 Estrogen receptor positive status [ER+]: Secondary | ICD-10-CM | POA: Insufficient documentation

## 2022-05-27 DIAGNOSIS — Z510 Encounter for antineoplastic radiation therapy: Secondary | ICD-10-CM | POA: Insufficient documentation

## 2022-05-27 DIAGNOSIS — Z803 Family history of malignant neoplasm of breast: Secondary | ICD-10-CM | POA: Insufficient documentation

## 2022-05-27 DIAGNOSIS — Z9013 Acquired absence of bilateral breasts and nipples: Secondary | ICD-10-CM | POA: Diagnosis not present

## 2022-05-27 NOTE — Progress Notes (Signed)
Follow-up-new appointment. I verified patient's identity and began nursing interview. No discomfort/concerns reported at this time.  Meaningful use complete. Having periods- Patient states "NO chances of pregnancy."   BP 109/75 (BP Location: Left Arm, Patient Position: Sitting, Cuff Size: Normal)   Pulse 92   Temp (!) 97.5 F (36.4 C)   Resp 18   Ht '5\' 6"'$  (1.676 m)   Wt 140 lb 6.4 oz (63.7 kg)   LMP 07/19/2019 (Approximate) Comment: Urine preg test negative 05/03/22. See progress note by Barbie Haggis, RN  SpO2 100%   BMI 22.66 kg/m

## 2022-05-27 NOTE — Progress Notes (Signed)
Radiation Oncology         [phone] ________________________________  Name: Monica [name]        MRN: [id number]  Date of Service: 05/27/2022 DOB: [birth year]  CC:Pcp, No  [name], MD     REFERRING PHYSICIAN: [name], MD   DIAGNOSIS: The primary encounter diagnosis was Malignant neoplasm of upper-outer quadrant of right breast in female, estrogen receptor positive ([facility]). A diagnosis of Breast cancer metastasized to axillary lymph node, right ([facility]) was also pertinent to this visit.   HISTORY OF PRESENT ILLNESS: Monica [name] is a 26 y.o. female with a diagnosis of right breast cancer.  The patient had a palpable mass noted and a family history of her grandmother who had breast cancer.  Diagnostic imaging showed a right breast mass with nipple retraction in the 11 to 2 o'clock position measuring up to 5.1 cm.  There was an additional mass in the 10 o'clock position but measurements were not discussed in conference.  An abnormal right axillary lymph node was identified.  There were also additional masses in the 830 and 11:00 positions.  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  At 10:00 nodal disease was also confirmed and her tumor was ER/PR positive HER2 negative with a KI 67 of 30 to 60%.    She proceeded with MRI of her breasts on 10/23/21 which showed the known breast cancer in the upper outer quadrant, and also up to 9.2 cm of non mass enhancement in the right breast as well as 2 abnormal right lymph nodes. There was also a left breast mass measuring up to 1.7 cm in the upper outer quadrant. A biopsy of the left breast on 10/29/21 showed benign changes. She proceeded  with neoadjuvant chemotherapy between 10/28/21 and 03/11/22. She had repeat MRI on 03/09/22 showing no significant change in the non mass enhancement, and significant improvement in the right axillary nodes. She underwent bilateral mastectomies with right  sentinel node biopsy and reconstruction with tissue expander placement. Final pathology showed benign changes in the left breast specimen and one benign lymph node. Her right mastectomy specimen showed residual invasive ductal carcinoma with 5% residual tumor in the bed. All margins were negative and all nodes were negative. She is not interested in any further expansion, and is seen today to discuss adjuvant radiotherapy to the right chest wall and regional nodes.   PREVIOUS RADIATION THERAPY: No   PAST MEDICAL HISTORY:  Past Medical History:  Diagnosis Date   Abscess    Cancer ([place]) 10/16/2021   right breast cancer       PAST SURGICAL HISTORY: Past Surgical History:  Procedure Laterality Date   BREAST RECONSTRUCTION WITH PLACEMENT OF TISSUE EXPANDER AND FLEX HD (ACELLULAR HYDRATED DERMIS) Bilateral 05/04/2022   Procedure: BREAST RECONSTRUCTION WITH PLACEMENT OF TISSUE EXPANDER AND FLEX HD (ACELLULAR HYDRATED DERMIS);  Surgeon: Lennice Sites, MD;  Location: [facility];  Service: Plastics;  Laterality: Bilateral;   MASTECTOMY W/ SENTINEL NODE BIOPSY Bilateral 05/04/2022   Procedure: BILATERAL SKIN SPARING MASTECTOMIES, RIGHT SEED LOCALIZED LYMPH NODE BIOPSY, RIGHT SENTINEL NODE BIOPSY;  Surgeon: Stark Klein, MD;  Location: [facility];  Service: General;  Laterality: Bilateral;   PORT-A-CATH REMOVAL Left 05/04/2022   Procedure: PORT REMOVAL;  Surgeon: Stark Klein, MD;  Location: [facility];  Service: General;  Laterality: Left;   PORTACATH PLACEMENT Left 10/27/2021   Procedure: INSERTION PORT-A-CATH;  Surgeon: Stark Klein, MD;  Location: [facility];  Service: General;  Laterality: Left;     FAMILY HISTORY:  Family History  Problem Relation Age of Onset   Breast cancer Maternal Grandmother 6     SOCIAL HISTORY:  reports that she has never smoked. She has never used smokeless tobacco. She reports that she does not drink alcohol and does not use drugs.  The patient is married and lives in [place].  She has 2 children and stays at home with one of them.    ALLERGIES: Patient has no known allergies.   MEDICATIONS:  Current Outpatient Medications  Medication Sig Dispense Refill   acetaminophen (TYLENOL) 500 MG tablet Take 1,000 mg by mouth daily as needed for headache or mild pain.     cyclobenzaprine (FLEXERIL) 5 MG tablet Take 1 tablet (5 mg total) by mouth 3 (three) times daily as needed for muscle spasms. 60 tablet 1   gabapentin (NEURONTIN) 100 MG capsule Take 2 capsules (200 mg total) by mouth 2 (two) times daily. 120 capsule 1   ondansetron (ZOFRAN) 4 MG tablet Take 1 tablet (4 mg total) by mouth every 8 (eight) hours as needed for nausea or vomiting. 20 tablet 0   oxyCODONE (ROXICODONE) 5 MG immediate release tablet Take 1 tablet (5 mg total) by mouth every 8 (eight) hours as needed for up to 10 doses for severe pain. 10 tablet 0   senna (SENOKOT) 8.6 MG TABS tablet Take 1 tablet (8.6 mg total) by mouth 2 (two) times daily. 120 tablet 0   No current facility-administered medications for this encounter.     REVIEW OF SYSTEMS: On review of systems, the patient reports that she is doing well overall and denies any concerns since chemotherapy. She reports she's seeing Dr. Erin Hearing in about two weeks to follow up on her surgical incisions due to left incision separation.      PHYSICAL EXAM:  Wt Readings from Last 3 Encounters:  05/27/22 142 lb (64.4 kg)  05/18/22 138 lb 14.4 oz (63 kg)  05/04/22 141 lb (64 kg)   Temp Readings from Last 3 Encounters:  05/18/22 (!) 97.3 F (36.3 C) (Temporal)  05/05/22 (!) 97.4 F (36.3 C) (Oral)  04/28/22 98.4 F (36.9 C)   BP Readings from Last 3 Encounters:  05/18/22 (!) 145/91  05/05/22 100/69  04/28/22 (!) 116/95   Pulse Readings from Last 3 Encounters:  05/18/22 (!) 102  05/05/22 73  04/28/22 89    In general this is a well appearing African-American female in no acute distress. She's alert and oriented x4 and has a  flat affect throughout the examination. Cardiopulmonary assessment is negative for acute distress and she exhibits normal effort. Her right chest wall shows an in situ tissue expander. No erythema, separation, or drainage is noted. Her left chest wall incision is also intact without visible separation today, and in situ tissue expander is present as well.     ECOG = 0  0 - Asymptomatic (Fully active, able to carry on all predisease activities without restriction)  1 - Symptomatic but completely ambulatory (Restricted in physically strenuous activity but ambulatory and able to carry out work of a light or sedentary nature. For example, light housework, office work)  2 - Symptomatic, <50% in bed during the day (Ambulatory and capable of all self care but unable to carry out any work activities. Up and about more than 50% of waking hours)  3 - Symptomatic, >50% in bed, but not bedbound (Capable of only limited  self-care, confined to bed or chair 50% or more of waking hours)  4 - Bedbound (Completely disabled. Cannot carry on any self-care. Totally confined to bed or chair)  5 - Death   Eustace Pen MM, Creech RH, Tormey DC, et al. [id number]). "Toxicity and response criteria of the [facility]". [place] Oncol. 5 (6): 649-55    LABORATORY DATA:  Lab Results  Component Value Date   WBC 7.0 05/05/2022   HGB 10.7 (L) 05/05/2022   HCT 30.5 (L) 05/05/2022   MCV 93.8 05/05/2022   PLT 190 05/05/2022   Lab Results  Component Value Date   NA 139 03/18/2022   K 3.9 03/18/2022   CL 106 03/18/2022   CO2 27 03/18/2022   Lab Results  Component Value Date   ALT 8 03/18/2022   AST 15 03/18/2022   ALKPHOS 31 (L) 03/18/2022   BILITOT 0.6 03/18/2022      RADIOGRAPHY: MM Breast Surgical Specimen  Result Date: 05/04/2022 CLINICAL DATA:  Evaluate surgical specimen of excised RIGHT axillary lymph node marked by radioactive seed. Patient undergoing bilateral mastectomies for RIGHT  breast cancer. EXAM: SPECIMEN RADIOGRAPH OF RIGHT AXILLA COMPARISON:  Previous exam(s). FINDINGS: Status post excision of the RIGHT axilla. The radioactive seed and a surgical clip are present within the specimen. IMPRESSION: Specimen radiograph of the RIGHT axilla. Electronically Signed   By: Margarette Canada M.D.   On: 05/04/2022 11:06  NM Sentinel Node Inj-No Rpt (Breast)  Result Date: 05/04/2022 Sulfur Colloid was injected by the Nuclear Medicine Technologist for sentinel lymph node localization.   [place] RT RADIOACTIVE SEED LOC  Result Date: 05/03/2022 CLINICAL DATA:  26 year old female for radioactive seed localization of RIGHT axillary lymph node metastasis. Patient with RIGHT breast cancer to undergo bilateral mastectomies. EXAM: ULTRASOUND GUIDED RADIOACTIVE SEED LOCALIZATION OF THE RIGHT AXILLA RIGHT MAMMOGRAM POST SEED PLACEMENT. COMPARISON:  Previous exam(s). FINDINGS: Patient presents for radioactive seed localization prior to targeted RIGHT axillary lymph node removal. I met with the patient and we discussed the procedure of seed localization including benefits and alternatives. We discussed the high likelihood of a successful procedure. We discussed the risks of the procedure including infection, bleeding, tissue injury and further surgery. We discussed the low dose of radioactivity involved in the procedure. Informed, written consent was given. The usual time-out protocol was performed immediately prior to the procedure. Using ultrasound guidance, sterile technique, 1% lidocaine and an I-125 radioactive seed, an abnormal appearing RIGHT axillary lymph node with loss of fatty hilum (which has a similar appearance to the biopsied node with similar appearing adjacent structures) was localized using a LATERAL approach. Follow-up survey of the patient confirms presence of the radioactive seed. The follow-up mammogram images does not demonstrate the seed due to the far posterior location and does not  correspond to the RIGHT axillary lymph node containing the heart shaped biopsy clip. Order number of I-125 seed:  [id number]. Total activity:  4.332 millicuries.  Reference Date: 03/25/2022. The patient tolerated the procedure well and was released from the [facility]. She was given instructions regarding seed removal. IMPRESSION: Radioactive seed localization of an abnormal appearing RIGHT axillary lymph node, which does not represent the same low RIGHT axillary lymph node which was biopsied on 10/08/2021. After discussion with Dr. Barry Dienes, a 2nd seed was not placed into the biopsy-proven metastatic RIGHT axillary lymph node as this would be technically difficult to localize and lies low within the RIGHT axilla/axillary tail and likely be removed at  mastectomy. Electronically Signed   By: Margarette Canada M.D.   On: 05/03/2022 15:18  MM CLIP PLACEMENT RIGHT  Result Date: 05/03/2022 CLINICAL DATA:  26 year old female for radioactive seed localization of RIGHT axillary lymph node metastasis. Patient with RIGHT breast cancer to undergo bilateral mastectomies. EXAM: ULTRASOUND GUIDED RADIOACTIVE SEED LOCALIZATION OF THE RIGHT AXILLA RIGHT MAMMOGRAM POST SEED PLACEMENT. COMPARISON:  Previous exam(s). FINDINGS: Patient presents for radioactive seed localization prior to targeted RIGHT axillary lymph node removal. I met with the patient and we discussed the procedure of seed localization including benefits and alternatives. We discussed the high likelihood of a successful procedure. We discussed the risks of the procedure including infection, bleeding, tissue injury and further surgery. We discussed the low dose of radioactivity involved in the procedure. Informed, written consent was given. The usual time-out protocol was performed immediately prior to the procedure. Using ultrasound guidance, sterile technique, 1% lidocaine and an I-125 radioactive seed, an abnormal appearing RIGHT axillary lymph node with loss of fatty  hilum (which has a similar appearance to the biopsied node with similar appearing adjacent structures) was localized using a LATERAL approach. Follow-up survey of the patient confirms presence of the radioactive seed. The follow-up mammogram images does not demonstrate the seed due to the far posterior location and does not correspond to the RIGHT axillary lymph node containing the heart shaped biopsy clip. Order number of I-125 seed:  [id number]. Total activity:  7.062 millicuries.  Reference Date: 03/25/2022. The patient tolerated the procedure well and was released from the [facility]. She was given instructions regarding seed removal. IMPRESSION: Radioactive seed localization of an abnormal appearing RIGHT axillary lymph node, which does not represent the same low RIGHT axillary lymph node which was biopsied on 10/08/2021. After discussion with Dr. Barry Dienes, a 2nd seed was not placed into the biopsy-proven metastatic RIGHT axillary lymph node as this would be technically difficult to localize and lies low within the RIGHT axilla/axillary tail and likely be removed at mastectomy. Electronically Signed   By: Margarette Canada M.D.   On: 05/03/2022 15:18      IMPRESSION/PLAN: 1. Stage IIA [id number] grade 2, ER/PR positive invasive ductal carcinoma of the right breast. Dr. Lisbeth Renshaw has reviewed the patient's final pathology findings and we reviewed the nature of node positive disease. She has completed neoadjuvant  therapy and is recovering well since her reconstruction expansion. Dr. Lisbeth Renshaw recommends external radiotherapy to the right chest wall  and regional lymph nodes to reduce risks of local recurrence followed by antiestrogen therapy. We discussed the risks, benefits, short, and long term effects of radiotherapy, as well as the curative intent, and the patient is interested in proceeding at the appropriate time. Dr. Lisbeth Renshaw discusses the delivery and logistics of radiotherapy and anticipates a course of 6 1/2 weeks of  radiotherapy to the right chest wall and nodes. Written consent is obtained and placed in the chart, a copy was provided to the patient. She will simulate today.  2. Contraceptive counseling.  The patient has 2 children.  The patient states she has not had a menstrual cycle since stopping birth control earlier this year or since chemotherapy. Her pregnancy test prior to surgery was negative. I would prefer pregnancy testing, but she insists she is not at risk of pregnancy, and is aware of the need to use condoms with her partner if she is active during treatment.    In a visit lasting 45 minutes, greater than 50% of the time was spent face  to face reviewing her case, as well as in preparation of, discussing, and coordinating the patient's care.     Carola Rhine, [facility]    **Disclaimer: This note was dictated with voice recognition software. Similar sounding words can inadvertently be transcribed and this note may contain transcription errors which may not have been corrected upon publication of note.**

## 2022-05-27 NOTE — Progress Notes (Signed)
Pt was noted to have difficulty positioning for simulation due to limited UE range of motion. We will plan for physical therapy referral and appreciate their assessment and recommendations.

## 2022-05-27 NOTE — Progress Notes (Signed)
Patient is status post bilateral breast reconstruction on 05/04/2022.  She currently has 400 mL in each tissue expander.  She is decided that she does not want to be any larger.  She is overall doing well.  Physical exam Incisions clean dry and intact, good breast symmetry.  Assessment and plan Patient is healing well and I think that she can begin radiation therapy at any time.  I will likely plan second stage reconstruction after this.  Typically leaves a pretty long interval between radiation and second stage reconstruction, usually 6 months.

## 2022-06-02 NOTE — Therapy (Signed)
OUTPATIENT PHYSICAL THERAPY ONCOLOGY EVALUATION  Patient Name: Monica [name] MRN: [id number] DOB:03-07-1996, 26 y.o., female Today's Date: 06/03/2022   PT End of Session - 06/03/22 1541     Visit Number 1    Number of Visits 9    Date for PT Re-Evaluation 07/01/22    Authorization Type [employer]    PT Start Time 1505    PT Stop Time 1538    PT Time Calculation (min) 33 min    Activity Tolerance Patient tolerated treatment well    Behavior During Therapy [facility] for tasks assessed/performed             Past Medical History:  Diagnosis Date   Abscess    Cancer ([place]) 10/16/2021   right breast cancer   Past Surgical History:  Procedure Laterality Date   BREAST RECONSTRUCTION WITH PLACEMENT OF TISSUE EXPANDER AND FLEX HD (ACELLULAR HYDRATED DERMIS) Bilateral 05/04/2022   Procedure: BREAST RECONSTRUCTION WITH PLACEMENT OF TISSUE EXPANDER AND FLEX HD (ACELLULAR HYDRATED DERMIS);  Surgeon: Lennice Sites, MD;  Location: [place];  Service: Plastics;  Laterality: Bilateral;   MASTECTOMY W/ SENTINEL NODE BIOPSY Bilateral 05/04/2022   Procedure: BILATERAL SKIN SPARING MASTECTOMIES, RIGHT SEED LOCALIZED LYMPH NODE BIOPSY, RIGHT SENTINEL NODE BIOPSY;  Surgeon: Stark Klein, MD;  Location: [place];  Service: General;  Laterality: Bilateral;   PORT-A-CATH REMOVAL Left 05/04/2022   Procedure: PORT REMOVAL;  Surgeon: Stark Klein, MD;  Location: [place];  Service: General;  Laterality: Left;   PORTACATH PLACEMENT Left 10/27/2021   Procedure: INSERTION PORT-A-CATH;  Surgeon: Stark Klein, MD;  Location: [place];  Service: General;  Laterality: Left;   Patient Active Problem List   Diagnosis Date Noted   Breast cancer ([place]) 05/04/2022   Breast cancer metastasized to axillary lymph node, right ([place]) 05/04/2022   BRCA2 gene mutation positive 01/14/2022   Genetic testing 01/14/2022   Port-A-Cath in place 11/04/2021   Malignant neoplasm of upper-outer quadrant of right breast in female, estrogen  receptor positive ([place]) 10/16/2021    PCP: none  REFERRING PROVIDER: Hayden Pedro, PA-C   REFERRING DIAG: bilateral mastectomies  THERAPY DIAG:  Stiffness of left shoulder, not elsewhere classified  Stiffness of right shoulder, not elsewhere classified  Aftercare following surgery for neoplasm  Abnormal posture  Malignant neoplasm of upper-outer quadrant of right breast in female, estrogen receptor positive ([place])  ONSET DATE: 05/04/22  Rationale for Evaluation and Treatment Rehabilitation  SUBJECTIVE  SUBJECTIVE STATEMENT: I have trouble reaching up in to the cabinet. I was unable to get in the position for radiation during the simulation.  PERTINENT HISTORY:  Pt learned she had right breast Cancer on Oct 13, 2021 after having biopsies performed. Pt had found the lump herself. It was determined to be Right breast Invasive mammary carcinoma with positive LN, ER+ and Ki 67 of 60%. Underwent bilateral skin sparing mastectomies with R SLNB (0/8) on 05/04/22 PAIN:  Are you having pain? No  PRECAUTIONS: Other: recent surgery, at risk for lymphedema on R   WEIGHT BEARING RESTRICTIONS No  FALLS:  Has patient fallen in last 6 months? No  LIVING ENVIRONMENT: Lives with: lives with their family Lives in: House/apartment Stairs: Yes;  Has following equipment at home: None  OCCUPATION: unemployed  LEISURE: pt does not exercise, pt reports she was exercising a few months ago  HAND DOMINANCE : left   PRIOR LEVEL OF FUNCTION: Independent  PATIENT GOALS to be able to use right hand again, be able to reach the top shelf, be able to wash back   OBJECTIVE  COGNITION:  Overall cognitive status: Within functional limits for tasks assessed    OBSERVATIONS / OTHER ASSESSMENTS: well  healing mastectomy scars with expanders placed   POSTURE: forward head, rounded shoulders  UPPER EXTREMITY AROM/PROM:  A/PROM RIGHT   eval   Shoulder extension 60  Shoulder flexion 125  Shoulder abduction 92  Shoulder internal rotation 71  Shoulder external rotation 74    (Blank rows = not tested)  A/PROM LEFT   eval  Shoulder extension 78  Shoulder flexion 125  Shoulder abduction 100  Shoulder internal rotation 73  Shoulder external rotation 90    (Blank rows = not tested)   LYMPHEDEMA ASSESSMENTS:   SURGERY TYPE/DATE: Bilateral skin sparing mastectomies with R SLNB (0/8) 05/04/22  NUMBER OF LYMPH NODES REMOVED: 0/8  CHEMOTHERAPY: completed chemo  RADIATION:will begin radiation next week on 06/09/22  HORMONE TREATMENT: pt is unsure  INFECTIONS: none  LYMPHEDEMA ASSESSMENTS:   LANDMARK RIGHT  eval  10 cm proximal to olecranon process 23.7  Olecranon process 22  10 cm proximal to ulnar styloid process 17.5  Just proximal to ulnar styloid process 14.2  Across hand at thumb web space 17  At base of 2nd digit 5.5  (Blank rows = not tested)  LANDMARK LEFT  eval  10 cm proximal to olecranon process 23.7  Olecranon process 22.2  10 cm proximal to ulnar styloid process 17.6  Just proximal to ulnar styloid process 14.5  Across hand at thumb web space 17.8  At base of 2nd digit 5.4  (Blank rows = not tested)    QUICK DASH SURVEY:   Katina Dung - 06/03/22 0001     Open a tight or new jar Mild difficulty    Do heavy household chores (wash walls, wash floors) Mild difficulty    Carry a shopping bag or briefcase Moderate difficulty    Wash your back Mild difficulty    Use a knife to cut food No difficulty    Recreational activities in which you take some force or impact through your arm, shoulder, or hand (golf, hammering, tennis) Moderate difficulty    During the past week, to what extent has your arm, shoulder or hand problem interfered with your normal  social activities with family, friends, neighbors, or groups? Quite a bit    During the past week, to what extent has your arm, shoulder  or hand problem limited your work or other regular daily activities Modererately    Arm, shoulder, or hand pain. Mild    Tingling (pins and needles) in your arm, shoulder, or hand Moderate    Difficulty Sleeping Mild difficulty    DASH Score 36.36 %               TODAY'S TREATMENT  06/03/22- In supine PROM to bilateral shoulders to patients tolerance in to flexion and abduction with marked improvement noted in R shoulder ROM following PROM, L shoulder was more tight today and pt reports she was feeling discomfort across the L expander  PATIENT EDUCATION:  Education details: post op breast exercises Person educated: Patient Education method: Explanation and Handouts Education comprehension: verbalized understanding   HOME EXERCISE PROGRAM: Post op breast exercises  ASSESSMENT:  CLINICAL IMPRESSION: Patient is a 26 y.o. female who was seen today for physical therapy evaluation and treatment for limited bilateral shoulder ROM following bilateral mastectomies and R SLNB (0/8) for treatment of R breast cancer. Pt recently had difficulty obtaining the position necessary for radiation during her simulation. Pt would benefit from skilled PT services to improve bilateral shoulder ROM to allow her to return to prior level of function.    OBJECTIVE IMPAIRMENTS decreased ROM, decreased strength, increased fascial restrictions, impaired UE functional use, postural dysfunction, and pain.   ACTIVITY LIMITATIONS carrying, lifting, and reach over head  PARTICIPATION LIMITATIONS: cleaning, laundry, and community activity  PERSONAL FACTORS  none  are also affecting patient's functional outcome.   REHAB POTENTIAL: Good  CLINICAL DECISION MAKING: Stable/uncomplicated  EVALUATION COMPLEXITY: Low  GOALS: Goals reviewed with patient? Yes  SHORT TERM  GOALS=LONG TERM GOALS  Target date: 07/01/2022    Pt will demonstrate 160 degrees of bilateral shoulder flexion to allow her to reach overhead. Baseline: bilat are 125 Goal status: INITIAL  2.  Pt will demonstrate 160 degrees of bilateral shoulder abduction to allow her to reach out to the side.  Baseline: R 92, L 100 Goal status: INITIAL  3.  Pt will be independent in a home exercise program for long term stretching and strengthening.  Baseline:  Goal status: INITIAL  4.  Pt will demonstrate improved quick dash score of less than or equal to 19 to allow improved function.  Baseline: 36.36 Goal status: INITIAL  PLAN: PT FREQUENCY: 2x/week  PT DURATION: 4 weeks  PLANNED INTERVENTIONS: Therapeutic exercises, Therapeutic activity, Patient/Family education, Self Care, Joint mobilization, Orthotic/Fit training, Manual lymph drainage, scar mobilization, Taping, Vasopneumatic device, and Manual therapy  PLAN FOR NEXT SESSION: PROM to bilateral shoulders, AAROM exercises, give supine dowel exercises   [employer], PT 06/03/2022, 3:56 PM

## 2022-06-03 ENCOUNTER — Encounter: Payer: Self-pay | Admitting: *Deleted

## 2022-06-03 ENCOUNTER — Encounter: Payer: Self-pay | Admitting: Physical Therapy

## 2022-06-03 ENCOUNTER — Other Ambulatory Visit: Payer: Self-pay

## 2022-06-03 ENCOUNTER — Ambulatory Visit: Payer: Medicaid Other | Attending: Radiation Oncology | Admitting: Physical Therapy

## 2022-06-03 DIAGNOSIS — M25611 Stiffness of right shoulder, not elsewhere classified: Secondary | ICD-10-CM | POA: Diagnosis present

## 2022-06-03 DIAGNOSIS — R293 Abnormal posture: Secondary | ICD-10-CM

## 2022-06-03 DIAGNOSIS — Z483 Aftercare following surgery for neoplasm: Secondary | ICD-10-CM | POA: Diagnosis present

## 2022-06-03 DIAGNOSIS — Z170 Estrogen receptor positive status [ER+]: Secondary | ICD-10-CM

## 2022-06-03 DIAGNOSIS — C50411 Malignant neoplasm of upper-outer quadrant of right female breast: Secondary | ICD-10-CM | POA: Insufficient documentation

## 2022-06-03 DIAGNOSIS — M25612 Stiffness of left shoulder, not elsewhere classified: Secondary | ICD-10-CM

## 2022-06-08 ENCOUNTER — Ambulatory Visit: Payer: Medicaid Other

## 2022-06-08 DIAGNOSIS — Z483 Aftercare following surgery for neoplasm: Secondary | ICD-10-CM

## 2022-06-08 DIAGNOSIS — C50411 Malignant neoplasm of upper-outer quadrant of right female breast: Secondary | ICD-10-CM

## 2022-06-08 DIAGNOSIS — M25612 Stiffness of left shoulder, not elsewhere classified: Secondary | ICD-10-CM | POA: Diagnosis not present

## 2022-06-08 DIAGNOSIS — R293 Abnormal posture: Secondary | ICD-10-CM

## 2022-06-08 DIAGNOSIS — M25611 Stiffness of right shoulder, not elsewhere classified: Secondary | ICD-10-CM

## 2022-06-08 NOTE — Therapy (Addendum)
OUTPATIENT PHYSICAL THERAPY ONCOLOGY TREATMENT  Patient Name: Monica [name] MRN: [id number] DOB:Jul 28, 1996, 26 y.o., female Today's Date: 06/08/2022   PT End of Session - 06/08/22 1009     Visit Number 2    Number of Visits 9    Date for PT Re-Evaluation 07/01/22    Authorization Type [employer]    PT Start Time 1006    PT Stop Time 1105    PT Time Calculation (min) 59 min    Activity Tolerance Patient tolerated treatment well    Behavior During Therapy [facility] for tasks assessed/performed             Past Medical History:  Diagnosis Date   Abscess    Cancer ([place]) 10/16/2021   right breast cancer   Past Surgical History:  Procedure Laterality Date   BREAST RECONSTRUCTION WITH PLACEMENT OF TISSUE EXPANDER AND FLEX HD (ACELLULAR HYDRATED DERMIS) Bilateral 05/04/2022   Procedure: BREAST RECONSTRUCTION WITH PLACEMENT OF TISSUE EXPANDER AND FLEX HD (ACELLULAR HYDRATED DERMIS);  Surgeon: Lennice Sites, MD;  Location: [place];  Service: Plastics;  Laterality: Bilateral;   MASTECTOMY W/ SENTINEL NODE BIOPSY Bilateral 05/04/2022   Procedure: BILATERAL SKIN SPARING MASTECTOMIES, RIGHT SEED LOCALIZED LYMPH NODE BIOPSY, RIGHT SENTINEL NODE BIOPSY;  Surgeon: Stark Klein, MD;  Location: [place];  Service: General;  Laterality: Bilateral;   PORT-A-CATH REMOVAL Left 05/04/2022   Procedure: PORT REMOVAL;  Surgeon: Stark Klein, MD;  Location: [place];  Service: General;  Laterality: Left;   PORTACATH PLACEMENT Left 10/27/2021   Procedure: INSERTION PORT-A-CATH;  Surgeon: Stark Klein, MD;  Location: [place];  Service: General;  Laterality: Left;   Patient Active Problem List   Diagnosis Date Noted   Breast cancer ([place]) 05/04/2022   Breast cancer metastasized to axillary lymph node, right ([place]) 05/04/2022   BRCA2 gene mutation positive 01/14/2022   Genetic testing 01/14/2022   Port-A-Cath in place 11/04/2021   Malignant neoplasm of upper-outer quadrant of right breast in female, estrogen  receptor positive ([place]) 10/16/2021    PCP: none  REFERRING PROVIDER: Hayden Pedro, PA-C   REFERRING DIAG: bilateral mastectomies  THERAPY DIAG:  Stiffness of left shoulder, not elsewhere classified  Stiffness of right shoulder, not elsewhere classified  Aftercare following surgery for neoplasm  Abnormal posture  Malignant neoplasm of upper-outer quadrant of right breast in female, estrogen receptor positive ([place])  ONSET DATE: 05/04/22  Rationale for Evaluation and Treatment Rehabilitation  SUBJECTIVE  SUBJECTIVE STATEMENT: I've only done the exercises about 2x since I was here last. But my shoulders feel a little looser when I use them.   PERTINENT HISTORY:  Pt learned she had right breast Cancer on Oct 13, 2021 after having biopsies performed. Pt had found the lump herself. It was determined to be Right breast Invasive mammary carcinoma with positive LN, ER+ and Ki 67 of 60%. Underwent bilateral skin sparing mastectomies with R SLNB (0/8) on 05/04/22 PAIN:  Are you having pain? No  PRECAUTIONS: Other: recent surgery, at risk for lymphedema on R   WEIGHT BEARING RESTRICTIONS No  FALLS:  Has patient fallen in last 6 months? No  LIVING ENVIRONMENT: Lives with: lives with their family Lives in: House/apartment Stairs: Yes;  Has following equipment at home: None  OCCUPATION: unemployed  LEISURE: pt does not exercise, pt reports she was exercising a few months ago  HAND DOMINANCE : left   PRIOR LEVEL OF FUNCTION: Independent  PATIENT GOALS to be able to use right hand again, be able to reach the top shelf, be able to wash back   OBJECTIVE  COGNITION:  Overall cognitive status: Within functional limits for tasks assessed    OBSERVATIONS / OTHER ASSESSMENTS: well healing  mastectomy scars with expanders placed   POSTURE: forward head, rounded shoulders  UPPER EXTREMITY AROM/PROM:  A/PROM RIGHT   eval   Shoulder extension 60  Shoulder flexion 125  Shoulder abduction 92  Shoulder internal rotation 71  Shoulder external rotation 74    (Blank rows = not tested)  A/PROM LEFT   eval  Shoulder extension 78  Shoulder flexion 125  Shoulder abduction 100  Shoulder internal rotation 73  Shoulder external rotation 90    (Blank rows = not tested)   LYMPHEDEMA ASSESSMENTS:   SURGERY TYPE/DATE: Bilateral skin sparing mastectomies with R SLNB (0/8) 05/04/22  NUMBER OF LYMPH NODES REMOVED: 0/8  CHEMOTHERAPY: completed chemo  RADIATION:will begin radiation next week on 06/09/22  HORMONE TREATMENT: pt is unsure  INFECTIONS: none  LYMPHEDEMA ASSESSMENTS:   LANDMARK RIGHT  eval  10 cm proximal to olecranon process 23.7  Olecranon process 22  10 cm proximal to ulnar styloid process 17.5  Just proximal to ulnar styloid process 14.2  Across hand at thumb web space 17  At base of 2nd digit 5.5  (Blank rows = not tested)  LANDMARK LEFT  eval  10 cm proximal to olecranon process 23.7  Olecranon process 22.2  10 cm proximal to ulnar styloid process 17.6  Just proximal to ulnar styloid process 14.5  Across hand at thumb web space 17.8  At base of 2nd digit 5.4  (Blank rows = not tested)    QUICK DASH SURVEY:       TODAY'S TREATMENT  06/08/22: Manual Therapy In supine PROM to bilateral shoulders to patients tolerance in to flexion, abduction, and D2 to pts tolerance; good improvement noted with end motions with all MFR: To bil axillae at areas of cording and palpable tightness Therapeutic Exercises Pulleys into flexion and abduction x2 mins each returning therapist demo; pt able to return good technique Roll ball up wall into flexion x10, then bil abd x5 each side returning therapist demo for each  06/03/22- In supine PROM to bilateral  shoulders to patients tolerance in to flexion and abduction with marked improvement noted in R shoulder ROM following PROM, L shoulder was more tight today and pt reports she was feeling discomfort across the L expander  PATIENT EDUCATION:  Education details: post op breast exercises Person educated: Patient Education method: Theatre stage manager Education comprehension: verbalized understanding   HOME EXERCISE PROGRAM: Post op breast exercises  ASSESSMENT:  CLINICAL IMPRESSION: Continued with manual therapy working to improve her end bil shoulder P/ROM. Both improved well by end of session. VCs required during to relax but pt was able to do so with cuing and required less cuing by end of session. Progressed her to AA/ROM exs with pulleys and ball roll up wall which pt was able to return excellent technique. She reports feeling like her motion has improved when doing AA/ROM stretches.   OBJECTIVE IMPAIRMENTS decreased ROM, decreased strength, increased fascial restrictions, impaired UE functional use, postural dysfunction, and pain.   ACTIVITY LIMITATIONS carrying, lifting, and reach over head  PARTICIPATION LIMITATIONS: cleaning, laundry, and community activity  PERSONAL FACTORS  none  are also affecting patient's functional outcome.   REHAB POTENTIAL: Good  CLINICAL DECISION MAKING: Stable/uncomplicated  EVALUATION COMPLEXITY: Low  GOALS: Goals reviewed with patient? Yes  SHORT TERM GOALS=LONG TERM GOALS  Target date: 07/06/2022    Pt will demonstrate 160 degrees of bilateral shoulder flexion to allow her to reach overhead. Baseline: bilat are 125 Goal status: INITIAL  2.  Pt will demonstrate 160 degrees of bilateral shoulder abduction to allow her to reach out to the side.  Baseline: R 92, L 100 Goal status: INITIAL  3.  Pt will be independent in a home exercise program for long term stretching and strengthening.  Baseline:  Goal status: INITIAL  4.  Pt will  demonstrate improved quick dash score of less than or equal to 19 to allow improved function.  Baseline: 36.36 Goal status: INITIAL  PLAN: PT FREQUENCY: 2x/week  PT DURATION: 4 weeks  PLANNED INTERVENTIONS: Therapeutic exercises, Therapeutic activity, Patient/Family education, Self Care, Joint mobilization, Orthotic/Fit training, Manual lymph drainage, scar mobilization, Taping, Vasopneumatic device, and Manual therapy  PLAN FOR NEXT SESSION: Pt reports may want to D/C after next session so remeasure A/ROM and assess goals. Cont PROM to bilateral shoulders, and AA/ROM exercises, progressing as able   Otelia Limes, PTA 06/08/2022, 11:06 AM   PHYSICAL THERAPY DISCHARGE SUMMARY  Visits from Start of Care: 2  Current functional level related to goals / functional outcomes: See above   Remaining deficits: See above   Education / Equipment: HEP   Patient agrees to discharge. Patient goals were not met. Patient is being discharged due to not returning since the last visit.  Allyson Sabal [place], [place] 11/16/22 1:00 PM

## 2022-06-09 ENCOUNTER — Ambulatory Visit: Payer: Medicaid Other | Admitting: Surgical

## 2022-06-09 DIAGNOSIS — C50911 Malignant neoplasm of unspecified site of right female breast: Secondary | ICD-10-CM

## 2022-06-09 DIAGNOSIS — Z9889 Other specified postprocedural states: Secondary | ICD-10-CM

## 2022-06-09 NOTE — Progress Notes (Signed)
Patient is a 26 year old female here for follow-up on her bilateral breast reconstruction with Dr. Erin Hearing.  She is 1 month postop, she is scheduled to start radiation soon.  She reports she has completed radiation mapping.  She reports she needs 5 weeks of radiation.  She has previously discussed 6 months of downtime after radiation prior to second stage reconstruction.  She currently has 400 cc in each tissue expander.  She reports that she has noticed some changes in bilateral breast after her last appointment, specifically the left breast along the superior portion she has a firm area that bothers her and feels that she has noticed some decrease in volume on the right side.  Physical exam Chaperone present on exam Incision clean dry and intact, no erythema or cellulitic changes.  No subcutaneous fluid collection noted.  She does have some increased fullness on the left side compared to the right side, the area of concern feels consistent with the tissue expander, potentially a tissue expander tab.  I do not see any signs of concern on exam, I did discuss with the patient that I do not appreciate any significant changes from her last appointment where photos were taken on 05/26/2022.  She may have had some decrease in swelling since then which may have caused some more prominence of the tissue expanders.  We did not do a fill today.  She is comfortable with the size.  We will plan to reevaluate her in approximately 3 months to discuss exchange 6 months postradiation.

## 2022-06-10 ENCOUNTER — Ambulatory Visit: Payer: Medicaid Other | Admitting: Radiation Oncology

## 2022-06-11 ENCOUNTER — Ambulatory Visit: Payer: Medicaid Other

## 2022-06-11 ENCOUNTER — Ambulatory Visit: Payer: Medicaid Other | Admitting: Physical Therapy

## 2022-06-11 DIAGNOSIS — Z510 Encounter for antineoplastic radiation therapy: Secondary | ICD-10-CM | POA: Diagnosis not present

## 2022-06-14 ENCOUNTER — Other Ambulatory Visit: Payer: Self-pay

## 2022-06-14 ENCOUNTER — Ambulatory Visit
Admission: RE | Admit: 2022-06-14 | Discharge: 2022-06-14 | Disposition: A | Discharge status: Home / Self Care | Payer: Medicaid Other | Source: Ambulatory Visit | Attending: Radiation Oncology | Admitting: Radiation Oncology

## 2022-06-14 DIAGNOSIS — Z510 Encounter for antineoplastic radiation therapy: Secondary | ICD-10-CM | POA: Diagnosis not present

## 2022-06-14 LAB — RAD ONC ARIA SESSION SUMMARY
Course Elapsed Days: 0
Plan Fractions Treated to Date: 1
Plan Prescribed Dose Per Fraction: 1.8 Gy
Plan Total Fractions Prescribed: 28
Plan Total Prescribed Dose: 50.4 Gy
Reference Point Dosage Given to Date: 1.8 Gy
Reference Point Session Dosage Given: 1.8 Gy
Session Number: 1

## 2022-06-15 ENCOUNTER — Ambulatory Visit: Payer: Medicaid Other | Admitting: Physical Therapy

## 2022-06-15 ENCOUNTER — Ambulatory Visit
Admission: RE | Admit: 2022-06-15 | Discharge: 2022-06-15 | Disposition: A | Discharge status: Home / Self Care | Payer: Medicaid Other | Source: Ambulatory Visit | Attending: Radiation Oncology | Admitting: Radiation Oncology

## 2022-06-15 ENCOUNTER — Other Ambulatory Visit: Payer: Self-pay

## 2022-06-15 DIAGNOSIS — Z510 Encounter for antineoplastic radiation therapy: Secondary | ICD-10-CM | POA: Diagnosis not present

## 2022-06-15 DIAGNOSIS — C50411 Malignant neoplasm of upper-outer quadrant of right female breast: Secondary | ICD-10-CM

## 2022-06-15 LAB — RAD ONC ARIA SESSION SUMMARY
Course Elapsed Days: 1
Plan Fractions Treated to Date: 1
Plan Prescribed Dose Per Fraction: 1.8 Gy
Plan Total Fractions Prescribed: 27
Plan Total Prescribed Dose: 48.6 Gy
Reference Point Dosage Given to Date: 3.6 Gy
Reference Point Session Dosage Given: 1.8 Gy
Session Number: 2

## 2022-06-15 MED ORDER — RADIAPLEXRX EX GEL: Freq: Once | CUTANEOUS | Status: AC

## 2022-06-15 MED ORDER — ALRA NON-METALLIC DEODORANT (RAD-ONC)
1.0000 | Freq: Once | TOPICAL | Status: AC
  Administered 2022-06-15: 1 via TOPICAL

## 2022-06-15 NOTE — Progress Notes (Signed)
Pt here for patient teaching.  Pt given Radiation and You booklet, skin care instructions, alra deodorant and Radiaplex.    Reviewed areas of pertinence such as fatigue, hair loss, skin changes, breast tenderness, and breast swelling. Pt able to give teach back of to pat skin and use unscented/gentle soap,apply Radiaplex bid, avoid applying anything to skin within 4 hours of treatment, avoid wearing an under wire bra, and to use an electric razor if they must shave. Pt verbalizes understanding of information given and will contact nursing with any questions or concerns.    Elvan Ebron M. Allisa Einspahr RN, BSN  

## 2022-06-16 ENCOUNTER — Other Ambulatory Visit: Payer: Self-pay

## 2022-06-16 ENCOUNTER — Ambulatory Visit
Admission: RE | Admit: 2022-06-16 | Discharge: 2022-06-16 | Disposition: A | Discharge status: Home / Self Care | Payer: Medicaid Other | Source: Ambulatory Visit | Attending: Radiation Oncology | Admitting: Radiation Oncology

## 2022-06-16 DIAGNOSIS — Z510 Encounter for antineoplastic radiation therapy: Secondary | ICD-10-CM | POA: Diagnosis not present

## 2022-06-16 LAB — RAD ONC ARIA SESSION SUMMARY
Course Elapsed Days: 2
Plan Fractions Treated to Date: 2
Plan Prescribed Dose Per Fraction: 1.8 Gy
Plan Total Fractions Prescribed: 27
Plan Total Prescribed Dose: 48.6 Gy
Reference Point Dosage Given to Date: 5.4 Gy
Reference Point Session Dosage Given: 1.8 Gy
Session Number: 3

## 2022-06-17 ENCOUNTER — Ambulatory Visit
Admission: RE | Admit: 2022-06-17 | Discharge: 2022-06-17 | Disposition: A | Discharge status: Home / Self Care | Payer: Medicaid Other | Source: Ambulatory Visit | Attending: Radiation Oncology | Admitting: Radiation Oncology

## 2022-06-17 ENCOUNTER — Inpatient Hospital Stay: Payer: Medicaid Other | Admitting: Hematology and Oncology

## 2022-06-17 ENCOUNTER — Other Ambulatory Visit: Payer: Self-pay | Admitting: Hematology and Oncology

## 2022-06-17 ENCOUNTER — Other Ambulatory Visit: Payer: Self-pay

## 2022-06-17 DIAGNOSIS — Z510 Encounter for antineoplastic radiation therapy: Secondary | ICD-10-CM | POA: Diagnosis not present

## 2022-06-17 DIAGNOSIS — Z170 Estrogen receptor positive status [ER+]: Secondary | ICD-10-CM

## 2022-06-17 LAB — RAD ONC ARIA SESSION SUMMARY
Course Elapsed Days: 3
Plan Fractions Treated to Date: 3
Plan Prescribed Dose Per Fraction: 1.8 Gy
Plan Total Fractions Prescribed: 27
Plan Total Prescribed Dose: 48.6 Gy
Reference Point Dosage Given to Date: 7.2 Gy
Reference Point Session Dosage Given: 1.8 Gy
Session Number: 4

## 2022-06-17 MED ORDER — LORAZEPAM 0.5 MG PO TABS: 0.5000 mg | ORAL_TABLET | Freq: Every evening | ORAL | 0 refills | Status: DC | PRN

## 2022-06-17 NOTE — Assessment & Plan Note (Deleted)
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 bilateral mastectomies 05/04/2022: Left breast: Benign Right mastectomy: 9 cm tumor with 5% residual tumor margins negative, 0/8 lymph nodes negative 3.Adjuvant radiation to complete 07/30/2022 4.Followed by adjuvant antiestrogen therapy (ovarian suppression) along with CDK 4 and 6 inhibitor 5.Genetic testing: BRCA2 mutation positive ------------------------------------------------------------------------------------------------------------- Current Treatment:Radiation followed by antiestrogen therapy Bone scan 1/20/23Bone scan: No mets MRI guided biopsies1/12/23: Left breast bx: Benign  Chemo-induced peripheral neuropathy: Mild Treatment plan: Complete estrogen blockade with ovarian suppression and aromatase inhibitor with CDK 4 and 6 inhibitor therapy

## 2022-06-18 ENCOUNTER — Ambulatory Visit: Payer: Medicaid Other

## 2022-06-18 ENCOUNTER — Ambulatory Visit: Payer: Medicaid Other | Attending: Radiation Oncology | Admitting: Rehabilitation

## 2022-06-18 DIAGNOSIS — R293 Abnormal posture: Secondary | ICD-10-CM | POA: Insufficient documentation

## 2022-06-18 DIAGNOSIS — M25611 Stiffness of right shoulder, not elsewhere classified: Secondary | ICD-10-CM | POA: Insufficient documentation

## 2022-06-18 DIAGNOSIS — Z170 Estrogen receptor positive status [ER+]: Secondary | ICD-10-CM | POA: Insufficient documentation

## 2022-06-18 DIAGNOSIS — Z483 Aftercare following surgery for neoplasm: Secondary | ICD-10-CM | POA: Insufficient documentation

## 2022-06-18 DIAGNOSIS — C50411 Malignant neoplasm of upper-outer quadrant of right female breast: Secondary | ICD-10-CM | POA: Insufficient documentation

## 2022-06-18 DIAGNOSIS — M25612 Stiffness of left shoulder, not elsewhere classified: Secondary | ICD-10-CM | POA: Insufficient documentation

## 2022-06-22 ENCOUNTER — Ambulatory Visit
Admission: RE | Admit: 2022-06-22 | Discharge: 2022-06-22 | Disposition: A | Discharge status: Home / Self Care | Payer: Medicaid Other | Source: Ambulatory Visit | Attending: Radiation Oncology | Admitting: Radiation Oncology

## 2022-06-22 ENCOUNTER — Other Ambulatory Visit: Payer: Self-pay

## 2022-06-22 ENCOUNTER — Ambulatory Visit: Payer: Medicaid Other

## 2022-06-22 DIAGNOSIS — C50411 Malignant neoplasm of upper-outer quadrant of right female breast: Secondary | ICD-10-CM | POA: Diagnosis present

## 2022-06-22 DIAGNOSIS — Z170 Estrogen receptor positive status [ER+]: Secondary | ICD-10-CM | POA: Insufficient documentation

## 2022-06-22 DIAGNOSIS — Z510 Encounter for antineoplastic radiation therapy: Secondary | ICD-10-CM | POA: Insufficient documentation

## 2022-06-22 LAB — RAD ONC ARIA SESSION SUMMARY
Course Elapsed Days: 8
Plan Fractions Treated to Date: 4
Plan Prescribed Dose Per Fraction: 1.8 Gy
Plan Total Fractions Prescribed: 27
Plan Total Prescribed Dose: 48.6 Gy
Reference Point Dosage Given to Date: 9 Gy
Reference Point Session Dosage Given: 1.8 Gy
Session Number: 5

## 2022-06-23 ENCOUNTER — Ambulatory Visit: Admission: RE | Admit: 2022-06-23 | Payer: Medicaid Other | Source: Ambulatory Visit

## 2022-06-23 ENCOUNTER — Other Ambulatory Visit: Payer: Self-pay

## 2022-06-23 ENCOUNTER — Ambulatory Visit
Admission: RE | Admit: 2022-06-23 | Discharge: 2022-06-23 | Disposition: A | Discharge status: Home / Self Care | Payer: Medicaid Other | Source: Ambulatory Visit | Attending: Radiation Oncology | Admitting: Radiation Oncology

## 2022-06-24 ENCOUNTER — Ambulatory Visit: Payer: Medicaid Other

## 2022-06-25 ENCOUNTER — Ambulatory Visit: Payer: Medicaid Other

## 2022-06-25 ENCOUNTER — Ambulatory Visit
Admission: RE | Admit: 2022-06-25 | Discharge: 2022-06-25 | Disposition: A | Discharge status: Home / Self Care | Payer: Medicaid Other | Source: Ambulatory Visit | Attending: Radiation Oncology | Admitting: Radiation Oncology

## 2022-06-25 ENCOUNTER — Other Ambulatory Visit: Payer: Self-pay

## 2022-06-25 ENCOUNTER — Telehealth: Payer: Self-pay | Admitting: Hematology and Oncology

## 2022-06-25 DIAGNOSIS — Z510 Encounter for antineoplastic radiation therapy: Secondary | ICD-10-CM | POA: Diagnosis not present

## 2022-06-25 LAB — RAD ONC ARIA SESSION SUMMARY
Course Elapsed Days: 11
Plan Fractions Treated to Date: 1
Plan Prescribed Dose Per Fraction: 1.8 Gy
Plan Total Fractions Prescribed: 11
Plan Total Prescribed Dose: 19.8 Gy
Reference Point Dosage Given to Date: 10.8 Gy
Reference Point Session Dosage Given: 1.8 Gy
Session Number: 6

## 2022-06-25 NOTE — Telephone Encounter (Signed)
Scheduled appointment per 8/31 staff message. Left voicemail.

## 2022-06-28 ENCOUNTER — Other Ambulatory Visit: Payer: Self-pay

## 2022-06-28 ENCOUNTER — Ambulatory Visit
Admission: RE | Admit: 2022-06-28 | Discharge: 2022-06-28 | Disposition: A | Discharge status: Home / Self Care | Payer: Medicaid Other | Source: Ambulatory Visit | Attending: Radiation Oncology | Admitting: Radiation Oncology

## 2022-06-28 DIAGNOSIS — Z510 Encounter for antineoplastic radiation therapy: Secondary | ICD-10-CM | POA: Diagnosis not present

## 2022-06-28 LAB — RAD ONC ARIA SESSION SUMMARY
Course Elapsed Days: 14
Plan Fractions Treated to Date: 1
Plan Prescribed Dose Per Fraction: 1.8 Gy
Plan Total Fractions Prescribed: 12
Plan Total Prescribed Dose: 21.6 Gy
Reference Point Dosage Given to Date: 12.6 Gy
Reference Point Session Dosage Given: 1.8 Gy
Session Number: 7

## 2022-06-29 ENCOUNTER — Ambulatory Visit
Admission: RE | Admit: 2022-06-29 | Discharge: 2022-06-29 | Disposition: A | Discharge status: Home / Self Care | Payer: Medicaid Other | Source: Ambulatory Visit | Attending: Radiation Oncology | Admitting: Radiation Oncology

## 2022-06-29 ENCOUNTER — Other Ambulatory Visit: Payer: Self-pay

## 2022-06-29 DIAGNOSIS — Z510 Encounter for antineoplastic radiation therapy: Secondary | ICD-10-CM | POA: Diagnosis not present

## 2022-06-29 LAB — RAD ONC ARIA SESSION SUMMARY
Course Elapsed Days: 15
Plan Fractions Treated to Date: 2
Plan Prescribed Dose Per Fraction: 1.8 Gy
Plan Total Fractions Prescribed: 11
Plan Total Prescribed Dose: 19.8 Gy
Reference Point Dosage Given to Date: 14.4 Gy
Reference Point Session Dosage Given: 1.8 Gy
Session Number: 8

## 2022-06-30 ENCOUNTER — Ambulatory Visit
Admission: RE | Admit: 2022-06-30 | Discharge: 2022-06-30 | Disposition: A | Discharge status: Home / Self Care | Payer: Medicaid Other | Source: Ambulatory Visit | Attending: Radiation Oncology | Admitting: Radiation Oncology

## 2022-06-30 ENCOUNTER — Other Ambulatory Visit: Payer: Self-pay

## 2022-06-30 DIAGNOSIS — Z510 Encounter for antineoplastic radiation therapy: Secondary | ICD-10-CM | POA: Diagnosis not present

## 2022-06-30 LAB — RAD ONC ARIA SESSION SUMMARY
Course Elapsed Days: 16
Course Elapsed Days: 16
Plan Fractions Treated to Date: 2
Plan Fractions Treated to Date: 1
Plan Prescribed Dose Per Fraction: 1.8 Gy
Plan Prescribed Dose Per Fraction: 1.8 Gy
Plan Total Fractions Prescribed: 12
Plan Total Fractions Prescribed: 10
Plan Total Prescribed Dose: 21.6 Gy
Plan Total Prescribed Dose: 18 Gy
Reference Point Dosage Given to Date: 16.2 Gy
Reference Point Dosage Given to Date: 18 Gy
Reference Point Session Dosage Given: 1.8 Gy
Reference Point Session Dosage Given: 1.8 Gy
Session Number: 9
Session Number: 10

## 2022-07-01 ENCOUNTER — Other Ambulatory Visit: Payer: Self-pay

## 2022-07-01 ENCOUNTER — Ambulatory Visit
Admission: RE | Admit: 2022-07-01 | Discharge: 2022-07-01 | Disposition: A | Discharge status: Home / Self Care | Payer: Medicaid Other | Source: Ambulatory Visit | Attending: Radiation Oncology | Admitting: Radiation Oncology

## 2022-07-01 DIAGNOSIS — Z510 Encounter for antineoplastic radiation therapy: Secondary | ICD-10-CM | POA: Diagnosis not present

## 2022-07-01 LAB — RAD ONC ARIA SESSION SUMMARY
Course Elapsed Days: 17
Plan Fractions Treated to Date: 1
Plan Prescribed Dose Per Fraction: 1.8 Gy
Plan Total Fractions Prescribed: 9
Plan Total Prescribed Dose: 16.2 Gy
Reference Point Dosage Given to Date: 19.8 Gy
Reference Point Session Dosage Given: 1.8 Gy
Session Number: 11

## 2022-07-02 ENCOUNTER — Ambulatory Visit
Admission: RE | Admit: 2022-07-02 | Discharge: 2022-07-02 | Disposition: A | Discharge status: Home / Self Care | Payer: Medicaid Other | Source: Ambulatory Visit | Attending: Radiation Oncology | Admitting: Radiation Oncology

## 2022-07-02 ENCOUNTER — Other Ambulatory Visit: Payer: Self-pay

## 2022-07-02 DIAGNOSIS — Z510 Encounter for antineoplastic radiation therapy: Secondary | ICD-10-CM | POA: Diagnosis not present

## 2022-07-02 LAB — RAD ONC ARIA SESSION SUMMARY
Course Elapsed Days: 18
Plan Fractions Treated to Date: 1
Plan Prescribed Dose Per Fraction: 1.8 Gy
Plan Total Fractions Prescribed: 9
Plan Total Prescribed Dose: 16.2 Gy
Reference Point Dosage Given to Date: 21.6 Gy
Reference Point Session Dosage Given: 1.8 Gy
Session Number: 12

## 2022-07-05 ENCOUNTER — Ambulatory Visit: Payer: Medicaid Other

## 2022-07-06 ENCOUNTER — Ambulatory Visit: Payer: Medicaid Other

## 2022-07-07 ENCOUNTER — Ambulatory Visit: Payer: Medicaid Other

## 2022-07-08 ENCOUNTER — Other Ambulatory Visit: Payer: Self-pay

## 2022-07-08 ENCOUNTER — Ambulatory Visit
Admission: RE | Admit: 2022-07-08 | Discharge: 2022-07-08 | Disposition: A | Discharge status: Home / Self Care | Payer: Medicaid Other | Source: Ambulatory Visit | Attending: Radiation Oncology | Admitting: Radiation Oncology

## 2022-07-08 DIAGNOSIS — Z510 Encounter for antineoplastic radiation therapy: Secondary | ICD-10-CM | POA: Diagnosis not present

## 2022-07-08 LAB — RAD ONC ARIA SESSION SUMMARY
Course Elapsed Days: 24
Plan Fractions Treated to Date: 2
Plan Prescribed Dose Per Fraction: 1.8 Gy
Plan Total Fractions Prescribed: 9
Plan Total Prescribed Dose: 16.2 Gy
Reference Point Dosage Given to Date: 23.4 Gy
Reference Point Session Dosage Given: 1.8 Gy
Session Number: 13

## 2022-07-09 ENCOUNTER — Ambulatory Visit
Admission: RE | Admit: 2022-07-09 | Discharge: 2022-07-09 | Disposition: A | Discharge status: Home / Self Care | Payer: Medicaid Other | Source: Ambulatory Visit | Attending: Radiation Oncology | Admitting: Radiation Oncology

## 2022-07-09 ENCOUNTER — Ambulatory Visit: Payer: Medicaid Other | Admitting: Radiation Oncology

## 2022-07-09 ENCOUNTER — Other Ambulatory Visit: Payer: Self-pay

## 2022-07-09 DIAGNOSIS — Z510 Encounter for antineoplastic radiation therapy: Secondary | ICD-10-CM | POA: Diagnosis not present

## 2022-07-09 LAB — RAD ONC ARIA SESSION SUMMARY
Course Elapsed Days: 25
Plan Fractions Treated to Date: 2
Plan Prescribed Dose Per Fraction: 1.8 Gy
Plan Total Fractions Prescribed: 9
Plan Total Prescribed Dose: 16.2 Gy
Reference Point Dosage Given to Date: 25.2 Gy
Reference Point Session Dosage Given: 1.8 Gy
Session Number: 14

## 2022-07-12 ENCOUNTER — Other Ambulatory Visit: Payer: Self-pay

## 2022-07-12 ENCOUNTER — Ambulatory Visit
Admission: RE | Admit: 2022-07-12 | Discharge: 2022-07-12 | Disposition: A | Discharge status: Home / Self Care | Payer: Medicaid Other | Source: Ambulatory Visit | Attending: Radiation Oncology | Admitting: Radiation Oncology

## 2022-07-12 DIAGNOSIS — Z510 Encounter for antineoplastic radiation therapy: Secondary | ICD-10-CM | POA: Diagnosis not present

## 2022-07-12 LAB — RAD ONC ARIA SESSION SUMMARY
Course Elapsed Days: 28
Plan Fractions Treated to Date: 3
Plan Prescribed Dose Per Fraction: 1.8 Gy
Plan Total Fractions Prescribed: 9
Plan Total Prescribed Dose: 16.2 Gy
Reference Point Dosage Given to Date: 27 Gy
Reference Point Session Dosage Given: 1.8 Gy
Session Number: 15

## 2022-07-13 ENCOUNTER — Ambulatory Visit
Admission: RE | Admit: 2022-07-13 | Discharge: 2022-07-13 | Disposition: A | Discharge status: Home / Self Care | Payer: Medicaid Other | Source: Ambulatory Visit | Attending: Radiation Oncology | Admitting: Radiation Oncology

## 2022-07-13 ENCOUNTER — Other Ambulatory Visit: Payer: Self-pay

## 2022-07-13 DIAGNOSIS — Z510 Encounter for antineoplastic radiation therapy: Secondary | ICD-10-CM | POA: Diagnosis not present

## 2022-07-13 LAB — RAD ONC ARIA SESSION SUMMARY
Course Elapsed Days: 29
Plan Fractions Treated to Date: 3
Plan Prescribed Dose Per Fraction: 1.8 Gy
Plan Total Fractions Prescribed: 9
Plan Total Prescribed Dose: 16.2 Gy
Reference Point Dosage Given to Date: 28.8 Gy
Reference Point Session Dosage Given: 1.8 Gy
Session Number: 16

## 2022-07-14 ENCOUNTER — Ambulatory Visit
Admission: RE | Admit: 2022-07-14 | Discharge: 2022-07-14 | Disposition: A | Discharge status: Home / Self Care | Payer: Medicaid Other | Source: Ambulatory Visit | Attending: Radiation Oncology | Admitting: Radiation Oncology

## 2022-07-14 ENCOUNTER — Other Ambulatory Visit: Payer: Self-pay

## 2022-07-14 DIAGNOSIS — Z510 Encounter for antineoplastic radiation therapy: Secondary | ICD-10-CM | POA: Diagnosis not present

## 2022-07-14 LAB — RAD ONC ARIA SESSION SUMMARY
Course Elapsed Days: 30
Plan Fractions Treated to Date: 4
Plan Prescribed Dose Per Fraction: 1.8 Gy
Plan Total Fractions Prescribed: 9
Plan Total Prescribed Dose: 16.2 Gy
Reference Point Dosage Given to Date: 30.6 Gy
Reference Point Session Dosage Given: 1.8 Gy
Session Number: 17

## 2022-07-15 ENCOUNTER — Other Ambulatory Visit: Payer: Self-pay

## 2022-07-15 ENCOUNTER — Ambulatory Visit
Admission: RE | Admit: 2022-07-15 | Discharge: 2022-07-15 | Disposition: A | Discharge status: Home / Self Care | Payer: Medicaid Other | Source: Ambulatory Visit | Attending: Radiation Oncology | Admitting: Radiation Oncology

## 2022-07-15 DIAGNOSIS — Z510 Encounter for antineoplastic radiation therapy: Secondary | ICD-10-CM | POA: Diagnosis not present

## 2022-07-15 LAB — RAD ONC ARIA SESSION SUMMARY
Course Elapsed Days: 31
Plan Fractions Treated to Date: 4
Plan Prescribed Dose Per Fraction: 1.8 Gy
Plan Total Fractions Prescribed: 9
Plan Total Prescribed Dose: 16.2 Gy
Reference Point Dosage Given to Date: 32.4 Gy
Reference Point Session Dosage Given: 1.8 Gy
Session Number: 18

## 2022-07-15 NOTE — Progress Notes (Signed)
Patient Care Team: Pcp, No as PCP - General [name], MD as Consulting Physician (Hematology and Oncology) Kyung Rudd, MD as Consulting Physician (Radiation Oncology) Stark Klein, MD as Consulting Physician (General Surgery)  DIAGNOSIS:  Encounter Diagnosis  Name Primary?   Malignant neoplasm of upper-outer quadrant of right breast in female, estrogen receptor positive ([place]) Yes    SUMMARY OF ONCOLOGIC HISTORY: Oncology History  Malignant neoplasm of upper-outer quadrant of right breast in female, estrogen receptor positive ([place])  10/16/2021 Initial Diagnosis   Malignant neoplasm of upper-outer quadrant of right breast in female, estrogen receptor positive ([place])   10/16/2021 Cancer Staging   Staging form: Breast, AJCC 8th Edition - Clinical stage from 10/16/2021: Stage IIA (cT3, cN1, cM0, G2, ER+, PR+, HER2: Equivocal) - Signed by [name], MD on 10/16/2021 Histologic grading system: 3 grade system   10/28/2021 - 03/11/2022 Chemotherapy   Patient is on Treatment Plan : BREAST ADJUVANT DOSE DENSE AC q14d / PACLitaxel q7d      Genetic Testing   [facility] CancerNext-Expanded identified 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 Report date is 01/21/2022.  The CancerNext-Expanded gene panel offered by [facility] and includes sequencing, rearrangement, and RNA analysis for the following 77 genes: AIP, ALK, APC, ATM, AXIN2, BAP1, BARD1, BLM, BMPR1A, BRCA1, BRCA2, BRIP1, CDC73, CDH1, CDK4, CDKN1B, CDKN2A, CHEK2, CTNNA1, DICER1, FANCC, FH, FLCN, GALNT12, KIF1B, LZTR1, MAX, MEN1, MET, MLH1, MSH2, MSH3, MSH6, MUTYH, NBN, NF1, NF2, NTHL1, PALB2, PHOX2B, PMS2, POT1, PRKAR1A, PTCH1, PTEN, RAD51C, RAD51D, RB1, RECQL, RET, SDHA, SDHAF2, SDHB, SDHC, SDHD, SMAD4, SMARCA4, SMARCB1, SMARCE1, STK11, SUFU, TMEM127, TP53, TSC1, TSC2, VHL and XRCC2 (sequencing and deletion/duplication); EGFR, EGLN1, HOXB13, KIT, MITF, PDGFRA, POLD1, and POLE (sequencing only); EPCAM and GREM1  (deletion/duplication only).      CHIEF COMPLIANT: Follow-up to discuss antiestrogen therapy  INTERVAL HISTORY: Monica [name] is a 26 y.o. with above-mentioned history of ER/PR positive right breast cancer, currently on chemotherapy with  Taxol. She presents to the clinic today for this patient regarding antiestrogen therapy she states radiation is going good.  She is complaining of bilateral knee pains.  She wants to make sure that there is no evidence of metastatic disease.  So far she is handling radiation fairly well.   ALLERGIES:  has No Known Allergies.  MEDICATIONS:  Current Outpatient Medications  Medication Sig Dispense Refill   acetaminophen (TYLENOL) 500 MG tablet Take 1,000 mg by mouth daily as needed for headache or mild pain.     cyclobenzaprine (FLEXERIL) 5 MG tablet Take 1 tablet (5 mg total) by mouth 3 (three) times daily as needed for muscle spasms. 60 tablet 1   gabapentin (NEURONTIN) 100 MG capsule Take 2 capsules (200 mg total) by mouth 2 (two) times daily. 120 capsule 1   LORazepam (ATIVAN) 0.5 MG tablet Take 1 tablet (0.5 mg total) by mouth at bedtime as needed for sleep. 30 tablet 0   ondansetron (ZOFRAN) 4 MG tablet Take 1 tablet (4 mg total) by mouth every 8 (eight) hours as needed for nausea or vomiting. 20 tablet 0   oxyCODONE (ROXICODONE) 5 MG immediate release tablet Take 1 tablet (5 mg total) by mouth every 8 (eight) hours as needed for up to 10 doses for severe pain. 10 tablet 0   senna (SENOKOT) 8.6 MG TABS tablet Take 1 tablet (8.6 mg total) by mouth 2 (two) times daily. 120 tablet 0   No current facility-administered medications for this visit.    PHYSICAL  EXAMINATION: ECOG PERFORMANCE STATUS: 1 - Symptomatic but completely ambulatory  Vitals:   07/19/22 0916  BP: 111/88  Pulse: 77  Resp: 18  Temp: 97.8 F (36.6 C)   Filed Weights   07/19/22 0916  Weight: 132 lb 8 oz (60.1 kg)      LABORATORY DATA:  I have reviewed the data as listed     Latest Ref Rng & Units 03/18/2022    9:26 AM 03/11/2022    8:35 AM 03/04/2022    9:12 AM  CMP  Glucose 70 - 99 mg/dL 110  104  108   BUN 6 - 20 mg/dL _0 Creatinine 0.44 - 1.00 mg/dL 0.59  0.64  0.59   Sodium 135 - 145 mmol/L 139  140  140   Potassium 3.5 - 5.1 mmol/L 3.9  3.9  3.9   Chloride 98 - 111 mmol/L 106  106  106   CO2 22 - 32 mmol/L _1 Calcium 8.9 - 10.3 mg/dL 9.9  9.7  9.7   Total Protein 6.5 - 8.1 g/dL 6.8  6.8  7.0   Total Bilirubin 0.3 - 1.2 mg/dL 0.6  0.4  0.3   Alkaline Phos 38 - 126 U/L 31  32  35   AST 15 - 41 U/L _2 ALT 0 - 44 U/L _3 Lab Results  Component Value Date   WBC 7.0 05/05/2022   HGB 10.7 (L) 05/05/2022   HCT 30.5 (L) 05/05/2022   MCV 93.8 05/05/2022   PLT 190 05/05/2022   NEUTROABS 4.6 03/18/2022    ASSESSMENT & PLAN:  Malignant neoplasm of upper-outer quadrant of right breast in female, estrogen receptor positive ([place]) 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  Neoadjuvant chemotherapy with dose dense Adriamycin and Cytoxan followed by Taxol weekly x9 stopped 03/11/2022 2. bilateral mastectomies 05/04/2022: Left breast: Benign Right mastectomy: 9 cm tumor with 5% residual tumor margins negative, 0/8 lymph nodes negative   3.  Adjuvant radiation: 06/30/2022-08/05/2022 4.  Followed by adjuvant antiestrogen therapy (ovarian suppression) along with CDK 4 and 6 inhibitor 5.  Genetic testing: BRCA2 mutation positive -------------------------------------------------------------------------------------------------------------   Current  Treatment: Antiestrogen therapy once radiation is completed Discussion: Complete estrogen blockade with Zoladex and anastrozole along with Verzinio was discussed in detail.  Bone scan 11/06/21 Bone scan: No mets MRI guided biopsies 10/29/21: Left breast bx: Benign    Chemo-induced peripheral neuropathy: Mild  I discussed about different treatment options including complete estrogen blockade with ovarian function suppression and anastrozole versus tamoxifen.  Patient is not very keen on taking any antiestrogen therapy but she will read more about it and will decide by the end of radiation.  We have not had a chance to talk about Verzinio because she is not even keen on taking the basic antiestrogen therapy.  We may set her up for Sign[employer] testing at a later date.    Orders Placed This Encounter  Procedures   NM Bone Scan Whole Body  Standing Status:   Future    Standing Expiration Date:   07/19/2023    Order Specific Question:   If indicated for the ordered procedure, I authorize the administration of a radiopharmaceutical per Radiology protocol    Answer:   Yes    Order Specific Question:   Is the patient pregnant?    Answer:   No    Order Specific Question:   Preferred imaging location?    Answer:   [facility]   The patient has a good understanding of the overall plan. she agrees with it. she will call with any problems that may develop before the next visit here. Total time spent: 30 mins including face to face time and time spent for planning, charting and co-ordination of care   Harriette Ohara, MD 07/19/22    I Gardiner Coins am scribing for Dr. Lindi Adie  I have reviewed the above documentation for accuracy and completeness, and I agree with the above.

## 2022-07-16 ENCOUNTER — Ambulatory Visit: Payer: Medicaid Other | Admitting: Radiation Oncology

## 2022-07-16 ENCOUNTER — Ambulatory Visit
Admission: RE | Admit: 2022-07-16 | Discharge: 2022-07-16 | Disposition: A | Discharge status: Home / Self Care | Payer: Medicaid Other | Source: Ambulatory Visit | Attending: Radiation Oncology | Admitting: Radiation Oncology

## 2022-07-16 ENCOUNTER — Other Ambulatory Visit: Payer: Self-pay

## 2022-07-16 DIAGNOSIS — Z510 Encounter for antineoplastic radiation therapy: Secondary | ICD-10-CM | POA: Diagnosis not present

## 2022-07-16 LAB — RAD ONC ARIA SESSION SUMMARY
Course Elapsed Days: 32
Plan Fractions Treated to Date: 5
Plan Prescribed Dose Per Fraction: 1.8 Gy
Plan Total Fractions Prescribed: 9
Plan Total Prescribed Dose: 16.2 Gy
Reference Point Dosage Given to Date: 34.2 Gy
Reference Point Session Dosage Given: 1.8 Gy
Session Number: 19

## 2022-07-19 ENCOUNTER — Ambulatory Visit
Admission: RE | Admit: 2022-07-19 | Discharge: 2022-07-19 | Disposition: A | Discharge status: Home / Self Care | Payer: Medicaid Other | Source: Ambulatory Visit | Attending: Radiation Oncology | Admitting: Radiation Oncology

## 2022-07-19 ENCOUNTER — Other Ambulatory Visit: Payer: Self-pay

## 2022-07-19 ENCOUNTER — Inpatient Hospital Stay: Payer: Medicaid Other | Attending: Hematology and Oncology | Admitting: Hematology and Oncology

## 2022-07-19 VITALS — BP 111/88 | HR 77 | Temp 97.8°F | Resp 18 | Ht 66.0 in | Wt 132.5 lb

## 2022-07-19 DIAGNOSIS — C50411 Malignant neoplasm of upper-outer quadrant of right female breast: Secondary | ICD-10-CM | POA: Insufficient documentation

## 2022-07-19 DIAGNOSIS — Z170 Estrogen receptor positive status [ER+]: Secondary | ICD-10-CM | POA: Insufficient documentation

## 2022-07-19 DIAGNOSIS — G620 Drug-induced polyneuropathy: Secondary | ICD-10-CM | POA: Insufficient documentation

## 2022-07-19 DIAGNOSIS — Z510 Encounter for antineoplastic radiation therapy: Secondary | ICD-10-CM | POA: Insufficient documentation

## 2022-07-19 DIAGNOSIS — Z79899 Other long term (current) drug therapy: Secondary | ICD-10-CM | POA: Insufficient documentation

## 2022-07-19 DIAGNOSIS — M25561 Pain in right knee: Secondary | ICD-10-CM | POA: Diagnosis not present

## 2022-07-19 DIAGNOSIS — Z9013 Acquired absence of bilateral breasts and nipples: Secondary | ICD-10-CM | POA: Insufficient documentation

## 2022-07-19 DIAGNOSIS — T451X5A Adverse effect of antineoplastic and immunosuppressive drugs, initial encounter: Secondary | ICD-10-CM | POA: Insufficient documentation

## 2022-07-19 DIAGNOSIS — M25562 Pain in left knee: Secondary | ICD-10-CM | POA: Insufficient documentation

## 2022-07-19 DIAGNOSIS — Z1501 Genetic susceptibility to malignant neoplasm of breast: Secondary | ICD-10-CM | POA: Insufficient documentation

## 2022-07-19 LAB — RAD ONC ARIA SESSION SUMMARY
Course Elapsed Days: 35
Plan Fractions Treated to Date: 5
Plan Prescribed Dose Per Fraction: 1.8 Gy
Plan Total Fractions Prescribed: 9
Plan Total Prescribed Dose: 16.2 Gy
Reference Point Dosage Given to Date: 36 Gy
Reference Point Session Dosage Given: 1.8 Gy
Session Number: 20

## 2022-07-19 NOTE — Assessment & Plan Note (Addendum)
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  Bone scan 1/20/23Bone scan: No mets MRI guided biopsies1/12/23: Left breast bx: Benign  Chemo-induced peripheral neuropathy: Mild  I discussed about different treatment options including complete estrogen blockade with ovarian function suppression and anastrozole versus tamoxifen.  Patient is not very keen on taking any antiestrogen therapy but she will read more about it and will decide by the end of radiation.  We have not had a chance to talk about Verzinio because she is not even keen on taking the basic antiestrogen therapy.  We may set her up for Signatera testing at a later date.

## 2022-07-20 ENCOUNTER — Ambulatory Visit
Admission: RE | Admit: 2022-07-20 | Discharge: 2022-07-20 | Disposition: A | Discharge status: Home / Self Care | Payer: Medicaid Other | Source: Ambulatory Visit | Attending: Radiation Oncology | Admitting: Radiation Oncology

## 2022-07-20 ENCOUNTER — Other Ambulatory Visit: Payer: Self-pay

## 2022-07-20 DIAGNOSIS — Z510 Encounter for antineoplastic radiation therapy: Secondary | ICD-10-CM | POA: Diagnosis not present

## 2022-07-20 LAB — RAD ONC ARIA SESSION SUMMARY
Course Elapsed Days: 36
Plan Fractions Treated to Date: 6
Plan Prescribed Dose Per Fraction: 1.8 Gy
Plan Total Fractions Prescribed: 9
Plan Total Prescribed Dose: 16.2 Gy
Reference Point Dosage Given to Date: 37.8 Gy
Reference Point Session Dosage Given: 1.8 Gy
Session Number: 21

## 2022-07-21 ENCOUNTER — Ambulatory Visit: Payer: Medicaid Other

## 2022-07-21 ENCOUNTER — Ambulatory Visit
Admission: RE | Admit: 2022-07-21 | Discharge: 2022-07-21 | Disposition: A | Discharge status: Home / Self Care | Payer: Medicaid Other | Source: Ambulatory Visit | Attending: Radiation Oncology | Admitting: Radiation Oncology

## 2022-07-21 ENCOUNTER — Other Ambulatory Visit: Payer: Self-pay

## 2022-07-21 DIAGNOSIS — Z510 Encounter for antineoplastic radiation therapy: Secondary | ICD-10-CM | POA: Diagnosis not present

## 2022-07-21 LAB — RAD ONC ARIA SESSION SUMMARY
Course Elapsed Days: 37
Plan Fractions Treated to Date: 6
Plan Prescribed Dose Per Fraction: 1.8 Gy
Plan Total Fractions Prescribed: 9
Plan Total Prescribed Dose: 16.2 Gy
Reference Point Dosage Given to Date: 39.6 Gy
Reference Point Session Dosage Given: 1.8 Gy
Session Number: 22

## 2022-07-22 ENCOUNTER — Other Ambulatory Visit: Payer: Self-pay

## 2022-07-22 ENCOUNTER — Ambulatory Visit
Admission: RE | Admit: 2022-07-22 | Discharge: 2022-07-22 | Disposition: A | Discharge status: Home / Self Care | Payer: Medicaid Other | Source: Ambulatory Visit | Attending: Radiation Oncology | Admitting: Radiation Oncology

## 2022-07-22 ENCOUNTER — Ambulatory Visit: Payer: Medicaid Other

## 2022-07-22 DIAGNOSIS — Z510 Encounter for antineoplastic radiation therapy: Secondary | ICD-10-CM | POA: Diagnosis not present

## 2022-07-22 LAB — RAD ONC ARIA SESSION SUMMARY
Course Elapsed Days: 38
Plan Fractions Treated to Date: 7
Plan Prescribed Dose Per Fraction: 1.8 Gy
Plan Total Fractions Prescribed: 9
Plan Total Prescribed Dose: 16.2 Gy
Reference Point Dosage Given to Date: 41.4 Gy
Reference Point Session Dosage Given: 1.8 Gy
Session Number: 23

## 2022-07-23 ENCOUNTER — Ambulatory Visit
Admission: RE | Admit: 2022-07-23 | Discharge: 2022-07-23 | Disposition: A | Discharge status: Home / Self Care | Payer: Medicaid Other | Source: Ambulatory Visit | Attending: Radiation Oncology | Admitting: Radiation Oncology

## 2022-07-23 ENCOUNTER — Ambulatory Visit: Admission: RE | Admit: 2022-07-23 | Payer: Medicaid Other | Source: Ambulatory Visit | Admitting: Radiation Oncology

## 2022-07-23 ENCOUNTER — Other Ambulatory Visit: Payer: Self-pay

## 2022-07-23 DIAGNOSIS — Z510 Encounter for antineoplastic radiation therapy: Secondary | ICD-10-CM | POA: Diagnosis not present

## 2022-07-23 LAB — RAD ONC ARIA SESSION SUMMARY
Course Elapsed Days: 39
Plan Fractions Treated to Date: 7
Plan Prescribed Dose Per Fraction: 1.8 Gy
Plan Total Fractions Prescribed: 9
Plan Total Prescribed Dose: 16.2 Gy
Reference Point Dosage Given to Date: 43.2 Gy
Reference Point Session Dosage Given: 1.8 Gy
Session Number: 24

## 2022-07-26 ENCOUNTER — Other Ambulatory Visit: Payer: Self-pay

## 2022-07-26 ENCOUNTER — Ambulatory Visit: Payer: Medicaid Other

## 2022-07-26 ENCOUNTER — Ambulatory Visit
Admission: RE | Admit: 2022-07-26 | Discharge: 2022-07-26 | Disposition: A | Discharge status: Home / Self Care | Payer: Medicaid Other | Source: Ambulatory Visit | Attending: Radiation Oncology | Admitting: Radiation Oncology

## 2022-07-26 ENCOUNTER — Telehealth: Payer: Self-pay | Admitting: Hematology and Oncology

## 2022-07-26 DIAGNOSIS — Z510 Encounter for antineoplastic radiation therapy: Secondary | ICD-10-CM | POA: Diagnosis not present

## 2022-07-26 LAB — RAD ONC ARIA SESSION SUMMARY
Course Elapsed Days: 42
Plan Fractions Treated to Date: 8
Plan Prescribed Dose Per Fraction: 1.8 Gy
Plan Total Fractions Prescribed: 9
Plan Total Prescribed Dose: 16.2 Gy
Reference Point Dosage Given to Date: 45 Gy
Reference Point Session Dosage Given: 1.8 Gy
Session Number: 25

## 2022-07-26 NOTE — Telephone Encounter (Signed)
Scheduled appointment per 10/2 los. Patient is aware.

## 2022-07-27 ENCOUNTER — Ambulatory Visit: Payer: Medicaid Other

## 2022-07-27 ENCOUNTER — Other Ambulatory Visit: Payer: Self-pay

## 2022-07-27 ENCOUNTER — Encounter: Payer: Self-pay | Admitting: *Deleted

## 2022-07-27 ENCOUNTER — Ambulatory Visit
Admission: RE | Admit: 2022-07-27 | Discharge: 2022-07-27 | Disposition: A | Discharge status: Home / Self Care | Payer: Medicaid Other | Source: Ambulatory Visit | Attending: Radiation Oncology | Admitting: Radiation Oncology

## 2022-07-27 DIAGNOSIS — Z510 Encounter for antineoplastic radiation therapy: Secondary | ICD-10-CM | POA: Diagnosis not present

## 2022-07-27 DIAGNOSIS — Z170 Estrogen receptor positive status [ER+]: Secondary | ICD-10-CM

## 2022-07-27 LAB — RAD ONC ARIA SESSION SUMMARY
Course Elapsed Days: 43
Plan Fractions Treated to Date: 8
Plan Prescribed Dose Per Fraction: 1.8 Gy
Plan Total Fractions Prescribed: 9
Plan Total Prescribed Dose: 16.2 Gy
Reference Point Dosage Given to Date: 46.8 Gy
Reference Point Session Dosage Given: 1.8 Gy
Session Number: 26

## 2022-07-28 ENCOUNTER — Ambulatory Visit: Payer: Medicaid Other

## 2022-07-28 ENCOUNTER — Ambulatory Visit
Admission: RE | Admit: 2022-07-28 | Discharge: 2022-07-28 | Disposition: A | Discharge status: Home / Self Care | Payer: Medicaid Other | Source: Ambulatory Visit | Attending: Radiation Oncology | Admitting: Radiation Oncology

## 2022-07-28 ENCOUNTER — Other Ambulatory Visit: Payer: Self-pay

## 2022-07-28 DIAGNOSIS — Z510 Encounter for antineoplastic radiation therapy: Secondary | ICD-10-CM | POA: Diagnosis not present

## 2022-07-28 LAB — RAD ONC ARIA SESSION SUMMARY
Course Elapsed Days: 44
Plan Fractions Treated to Date: 9
Plan Prescribed Dose Per Fraction: 1.8 Gy
Plan Total Fractions Prescribed: 9
Plan Total Prescribed Dose: 16.2 Gy
Reference Point Dosage Given to Date: 48.6 Gy
Reference Point Session Dosage Given: 1.8 Gy
Session Number: 27

## 2022-07-29 ENCOUNTER — Ambulatory Visit
Admission: RE | Admit: 2022-07-29 | Discharge: 2022-07-29 | Disposition: A | Discharge status: Home / Self Care | Payer: Medicaid Other | Source: Ambulatory Visit | Attending: Radiation Oncology | Admitting: Radiation Oncology

## 2022-07-29 ENCOUNTER — Other Ambulatory Visit: Payer: Self-pay

## 2022-07-29 ENCOUNTER — Ambulatory Visit: Payer: Medicaid Other

## 2022-07-29 DIAGNOSIS — Z510 Encounter for antineoplastic radiation therapy: Secondary | ICD-10-CM | POA: Diagnosis not present

## 2022-07-29 LAB — RAD ONC ARIA SESSION SUMMARY
Course Elapsed Days: 45
Plan Fractions Treated to Date: 9
Plan Prescribed Dose Per Fraction: 1.8 Gy
Plan Total Fractions Prescribed: 9
Plan Total Prescribed Dose: 16.2 Gy
Reference Point Dosage Given to Date: 50.4 Gy
Reference Point Session Dosage Given: 1.8 Gy
Session Number: 28

## 2022-07-30 ENCOUNTER — Ambulatory Visit
Admission: RE | Admit: 2022-07-30 | Discharge: 2022-07-30 | Disposition: A | Discharge status: Home / Self Care | Payer: Medicaid Other | Source: Ambulatory Visit | Attending: Radiation Oncology | Admitting: Radiation Oncology

## 2022-07-30 ENCOUNTER — Ambulatory Visit: Payer: Medicaid Other

## 2022-07-30 ENCOUNTER — Other Ambulatory Visit: Payer: Self-pay

## 2022-07-30 DIAGNOSIS — Z510 Encounter for antineoplastic radiation therapy: Secondary | ICD-10-CM | POA: Diagnosis not present

## 2022-07-30 LAB — RAD ONC ARIA SESSION SUMMARY
Course Elapsed Days: 46
Plan Fractions Treated to Date: 1
Plan Prescribed Dose Per Fraction: 2 Gy
Plan Total Fractions Prescribed: 5
Plan Total Prescribed Dose: 10 Gy
Reference Point Dosage Given to Date: 2 Gy
Reference Point Session Dosage Given: 2 Gy
Session Number: 29

## 2022-08-02 ENCOUNTER — Ambulatory Visit: Payer: Medicaid Other

## 2022-08-02 ENCOUNTER — Other Ambulatory Visit: Payer: Self-pay

## 2022-08-02 ENCOUNTER — Ambulatory Visit
Admission: RE | Admit: 2022-08-02 | Discharge: 2022-08-02 | Disposition: A | Discharge status: Home / Self Care | Payer: Medicaid Other | Source: Ambulatory Visit | Attending: Radiation Oncology | Admitting: Radiation Oncology

## 2022-08-02 DIAGNOSIS — Z510 Encounter for antineoplastic radiation therapy: Secondary | ICD-10-CM | POA: Diagnosis not present

## 2022-08-02 LAB — RAD ONC ARIA SESSION SUMMARY
Course Elapsed Days: 49
Plan Fractions Treated to Date: 2
Plan Prescribed Dose Per Fraction: 2 Gy
Plan Total Fractions Prescribed: 5
Plan Total Prescribed Dose: 10 Gy
Reference Point Dosage Given to Date: 4 Gy
Reference Point Session Dosage Given: 2 Gy
Session Number: 30

## 2022-08-02 NOTE — Progress Notes (Signed)
 Patient Care Team: Pcp, No as PCP - General Gudena, Vinay, MD as Consulting Physician (Hematology and Oncology) Moody, John, MD as Consulting Physician (Radiation Oncology) Byerly, Faera, MD as Consulting Physician (General Surgery)  DIAGNOSIS: No diagnosis found.  SUMMARY OF ONCOLOGIC HISTORY: Oncology History  Malignant neoplasm of upper-outer quadrant of right breast in female, estrogen receptor positive ([facility])  10/16/2021 Initial Diagnosis   Malignant neoplasm of upper-outer quadrant of right breast in female, estrogen receptor positive ([facility])   10/16/2021 Cancer Staging   Staging form: Breast, AJCC 8th Edition - Clinical stage from 10/16/2021: Stage IIA (cT3, cN1, cM0, G2, ER+, PR+, HER2: Equivocal) - Signed by Gudena, Vinay, MD on 10/16/2021 Histologic grading system: 3 grade system   10/28/2021 - 03/11/2022 Chemotherapy   Patient is on Treatment Plan : BREAST ADJUVANT DOSE DENSE AC q14d / PACLitaxel q7d      Genetic Testing   [facility] CancerNext-Expanded identified 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 Report date is 01/21/2022.  The CancerNext-Expanded gene panel offered by [facility] and includes sequencing, rearrangement, and RNA analysis for the following 77 genes: AIP, ALK, APC, ATM, AXIN2, BAP1, BARD1, BLM, BMPR1A, BRCA1, BRCA2, BRIP1, CDC73, CDH1, CDK4, CDKN1B, CDKN2A, CHEK2, CTNNA1, DICER1, FANCC, FH, FLCN, GALNT12, KIF1B, LZTR1, MAX, MEN1, MET, MLH1, MSH2, MSH3, MSH6, MUTYH, NBN, NF1, NF2, NTHL1, PALB2, PHOX2B, PMS2, POT1, PRKAR1A, PTCH1, PTEN, RAD51C, RAD51D, RB1, RECQL, RET, SDHA, SDHAF2, SDHB, SDHC, SDHD, SMAD4, SMARCA4, SMARCB1, SMARCE1, STK11, SUFU, TMEM127, TP53, TSC1, TSC2, VHL and XRCC2 (sequencing and deletion/duplication); EGFR, EGLN1, HOXB13, KIT, MITF, PDGFRA, POLD1, and POLE (sequencing only); EPCAM and GREM1 (deletion/duplication only).      CHIEF COMPLIANT:   INTERVAL HISTORY: Monica [name] is a   ALLERGIES:  has No Known  Allergies.  MEDICATIONS:  Current Outpatient Medications  Medication Sig Dispense Refill   acetaminophen (TYLENOL) 500 MG tablet Take 1,000 mg by mouth daily as needed for headache or mild pain.     cyclobenzaprine (FLEXERIL) 5 MG tablet Take 1 tablet (5 mg total) by mouth 3 (three) times daily as needed for muscle spasms. 60 tablet 1   gabapentin (NEURONTIN) 100 MG capsule Take 2 capsules (200 mg total) by mouth 2 (two) times daily. 120 capsule 1   LORazepam (ATIVAN) 0.5 MG tablet Take 1 tablet (0.5 mg total) by mouth at bedtime as needed for sleep. 30 tablet 0   ondansetron (ZOFRAN) 4 MG tablet Take 1 tablet (4 mg total) by mouth every 8 (eight) hours as needed for nausea or vomiting. 20 tablet 0   oxyCODONE (ROXICODONE) 5 MG immediate release tablet Take 1 tablet (5 mg total) by mouth every 8 (eight) hours as needed for up to 10 doses for severe pain. 10 tablet 0   senna (SENOKOT) 8.6 MG TABS tablet Take 1 tablet (8.6 mg total) by mouth 2 (two) times daily. 120 tablet 0   No current facility-administered medications for this visit.    PHYSICAL EXAMINATION: ECOG PERFORMANCE STATUS: {CHL ONC ECOG PS:[id number]}  There were no vitals filed for this visit. There were no vitals filed for this visit.  BREAST:*** No palpable masses or nodules in either right or left breasts. No palpable axillary supraclavicular or infraclavicular adenopathy no breast tenderness or nipple discharge. (exam performed in the presence of a chaperone)  LABORATORY DATA:  I have reviewed the data as listed    Latest Ref Rng & Units 03/18/2022    9:26 AM 03/11/2022    8:35 AM 03/04/2022      9:12 AM  CMP  Glucose 70 - 99 mg/dL 110  104  108   BUN 6 - 20 mg/dL 12  11  8   Creatinine 0.44 - 1.00 mg/dL 0.59  0.64  0.59   Sodium 135 - 145 mmol/L 139  140  140   Potassium 3.5 - 5.1 mmol/L 3.9  3.9  3.9   Chloride 98 - 111 mmol/L 106  106  106   CO2 22 - 32 mmol/L 27  26  26   Calcium 8.9 - 10.3 mg/dL 9.9  9.7  9.7    Total Protein 6.5 - 8.1 g/dL 6.8  6.8  7.0   Total Bilirubin 0.3 - 1.2 mg/dL 0.6  0.4  0.3   Alkaline Phos 38 - 126 U/L 31  32  35   AST 15 - 41 U/L 15  13  15   ALT 0 - 44 U/L 8  8  8     Lab Results  Component Value Date   WBC 7.0 05/05/2022   HGB 10.7 (L) 05/05/2022   HCT 30.5 (L) 05/05/2022   MCV 93.8 05/05/2022   PLT 190 05/05/2022   NEUTROABS 4.6 03/18/2022    ASSESSMENT & PLAN:  No problem-specific Assessment & Plan notes found for this encounter.    No orders of the defined types were placed in this encounter.  The patient has a good understanding of the overall plan. she agrees with it. she will call with any problems that may develop before the next visit here. Total time spent: 30 mins including face to face time and time spent for planning, charting and co-ordination of care   Deritra L Mcnairy, CMA 08/02/22    I Deritra, Mcnairy am scribing for Dr. Gudena  ***  

## 2022-08-03 ENCOUNTER — Ambulatory Visit: Payer: Medicaid Other

## 2022-08-03 ENCOUNTER — Other Ambulatory Visit: Payer: Self-pay

## 2022-08-03 ENCOUNTER — Ambulatory Visit
Admission: RE | Admit: 2022-08-03 | Discharge: 2022-08-03 | Disposition: A | Discharge status: Home / Self Care | Payer: Medicaid Other | Source: Ambulatory Visit | Attending: Radiation Oncology | Admitting: Radiation Oncology

## 2022-08-03 DIAGNOSIS — Z510 Encounter for antineoplastic radiation therapy: Secondary | ICD-10-CM | POA: Diagnosis not present

## 2022-08-03 LAB — RAD ONC ARIA SESSION SUMMARY
Course Elapsed Days: 50
Plan Fractions Treated to Date: 3
Plan Prescribed Dose Per Fraction: 2 Gy
Plan Total Fractions Prescribed: 5
Plan Total Prescribed Dose: 10 Gy
Reference Point Dosage Given to Date: 6 Gy
Reference Point Session Dosage Given: 2 Gy
Session Number: 31

## 2022-08-04 ENCOUNTER — Ambulatory Visit
Admission: RE | Admit: 2022-08-04 | Discharge: 2022-08-04 | Disposition: A | Discharge status: Home / Self Care | Payer: Medicaid Other | Source: Ambulatory Visit | Attending: Radiation Oncology | Admitting: Radiation Oncology

## 2022-08-04 ENCOUNTER — Ambulatory Visit: Payer: Medicaid Other

## 2022-08-04 ENCOUNTER — Other Ambulatory Visit: Payer: Self-pay

## 2022-08-04 DIAGNOSIS — Z510 Encounter for antineoplastic radiation therapy: Secondary | ICD-10-CM | POA: Diagnosis not present

## 2022-08-04 LAB — RAD ONC ARIA SESSION SUMMARY
Course Elapsed Days: 51
Plan Fractions Treated to Date: 4
Plan Prescribed Dose Per Fraction: 2 Gy
Plan Total Fractions Prescribed: 5
Plan Total Prescribed Dose: 10 Gy
Reference Point Dosage Given to Date: 8 Gy
Reference Point Session Dosage Given: 2 Gy
Session Number: 32

## 2022-08-05 ENCOUNTER — Inpatient Hospital Stay (HOSPITAL_BASED_OUTPATIENT_CLINIC_OR_DEPARTMENT_OTHER): Payer: Medicaid Other | Admitting: Hematology and Oncology

## 2022-08-05 ENCOUNTER — Other Ambulatory Visit: Payer: Self-pay

## 2022-08-05 ENCOUNTER — Telehealth: Payer: Self-pay

## 2022-08-05 ENCOUNTER — Ambulatory Visit
Admission: RE | Admit: 2022-08-05 | Discharge: 2022-08-05 | Disposition: A | Discharge status: Home / Self Care | Payer: Medicaid Other | Source: Ambulatory Visit | Attending: Radiation Oncology | Admitting: Radiation Oncology

## 2022-08-05 ENCOUNTER — Encounter: Payer: Self-pay | Admitting: Radiation Oncology

## 2022-08-05 ENCOUNTER — Inpatient Hospital Stay: Payer: Medicaid Other

## 2022-08-05 DIAGNOSIS — C50411 Malignant neoplasm of upper-outer quadrant of right female breast: Secondary | ICD-10-CM

## 2022-08-05 DIAGNOSIS — Z170 Estrogen receptor positive status [ER+]: Secondary | ICD-10-CM

## 2022-08-05 DIAGNOSIS — Z510 Encounter for antineoplastic radiation therapy: Secondary | ICD-10-CM | POA: Diagnosis not present

## 2022-08-05 LAB — RAD ONC ARIA SESSION SUMMARY
Course Elapsed Days: 52
Plan Fractions Treated to Date: 5
Plan Prescribed Dose Per Fraction: 2 Gy
Plan Total Fractions Prescribed: 5
Plan Total Prescribed Dose: 10 Gy
Reference Point Dosage Given to Date: 10 Gy
Reference Point Session Dosage Given: 2 Gy
Session Number: 33

## 2022-08-05 LAB — PREGNANCY, URINE: Preg Test, Ur: POSITIVE — AB

## 2022-08-05 MED ORDER — TAMOXIFEN CITRATE 20 MG PO TABS: 20.0000 mg | ORAL_TABLET | Freq: Every day | ORAL | 3 refills | Status: DC

## 2022-08-05 NOTE — Assessment & Plan Note (Signed)
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,FISH negative  Treatment plan: 1.Neoadjuvant chemotherapy with dose dense Adriamycin and Cytoxan followed by Taxol weekly x9stopped 03/11/2022 2.bilateral mastectomies 05/04/2022: Left breast: Benign Right mastectomy: 9 cm tumor with 5% residual tumor margins negative, 0/8 lymph nodes negative  3.Adjuvant radiation: 06/30/2022-08/05/2022 4.Followed by adjuvant antiestrogen therapy (ovarian suppression) along with CDK 4 and 6 inhibitor 5.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  Bone scan 1/20/23Bone scan: No mets MRI guided biopsies1/12/23: Left breast bx: Benign  Chemo-induced peripheral neuropathy: Mild  I discussed about different treatment options including complete estrogen blockade with ovarian function suppression and anastrozole versus tamoxifen.  Patient is not very keen on taking any antiestrogen therapy but she will read more about it and will decide by the end of radiation.  We have not had a chance to talk about Verzinio because she is not even keen on taking the basic antiestrogen therapy.

## 2022-08-05 NOTE — Telephone Encounter (Signed)
Opened in error

## 2022-08-07 ENCOUNTER — Encounter (HOSPITAL_COMMUNITY)
Admission: AD | Disposition: A | Discharge status: Home / Self Care | Payer: Self-pay | Source: Home / Self Care | Attending: Emergency Medicine

## 2022-08-07 ENCOUNTER — Inpatient Hospital Stay (HOSPITAL_COMMUNITY): Payer: Medicaid Other | Admitting: Certified Registered"

## 2022-08-07 ENCOUNTER — Emergency Department (HOSPITAL_BASED_OUTPATIENT_CLINIC_OR_DEPARTMENT_OTHER): Payer: Medicaid Other

## 2022-08-07 ENCOUNTER — Encounter (HOSPITAL_BASED_OUTPATIENT_CLINIC_OR_DEPARTMENT_OTHER): Payer: Self-pay

## 2022-08-07 ENCOUNTER — Inpatient Hospital Stay (EMERGENCY_DEPARTMENT_HOSPITAL): Payer: Medicaid Other | Admitting: Certified Registered"

## 2022-08-07 ENCOUNTER — Other Ambulatory Visit: Payer: Self-pay

## 2022-08-07 ENCOUNTER — Inpatient Hospital Stay (HOSPITAL_BASED_OUTPATIENT_CLINIC_OR_DEPARTMENT_OTHER)
Admission: AD | Admit: 2022-08-07 | Discharge: 2022-08-07 | Disposition: A | Discharge status: Home / Self Care | Payer: Medicaid Other | Attending: Family Medicine | Admitting: Family Medicine

## 2022-08-07 DIAGNOSIS — Z923 Personal history of irradiation: Secondary | ICD-10-CM | POA: Diagnosis not present

## 2022-08-07 DIAGNOSIS — Z9221 Personal history of antineoplastic chemotherapy: Secondary | ICD-10-CM | POA: Insufficient documentation

## 2022-08-07 DIAGNOSIS — C50911 Malignant neoplasm of unspecified site of right female breast: Secondary | ICD-10-CM | POA: Insufficient documentation

## 2022-08-07 DIAGNOSIS — O00101 Right tubal pregnancy without intrauterine pregnancy: Secondary | ICD-10-CM

## 2022-08-07 DIAGNOSIS — O009 Unspecified ectopic pregnancy without intrauterine pregnancy: Secondary | ICD-10-CM

## 2022-08-07 DIAGNOSIS — K661 Hemoperitoneum: Secondary | ICD-10-CM | POA: Diagnosis not present

## 2022-08-07 DIAGNOSIS — O00109 Unspecified tubal pregnancy without intrauterine pregnancy: Secondary | ICD-10-CM | POA: Insufficient documentation

## 2022-08-07 HISTORY — PX: LAPAROSCOPIC UNILATERAL SALPINGECTOMY: SHX5934

## 2022-08-07 LAB — COMPREHENSIVE METABOLIC PANEL
ALT: 5 U/L (ref 0–44)
AST: 14 U/L — ABNORMAL LOW (ref 15–41)
Albumin: 4.7 g/dL (ref 3.5–5.0)
Alkaline Phosphatase: 43 U/L (ref 38–126)
Anion gap: 14 (ref 5–15)
BUN: 12 mg/dL (ref 6–20)
CO2: 23 mmol/L (ref 22–32)
Calcium: 9.8 mg/dL (ref 8.9–10.3)
Chloride: 103 mmol/L (ref 98–111)
Creatinine, Ser: 0.62 mg/dL (ref 0.44–1.00)
GFR, Estimated: >60 mL/min (ref >60)
Glucose, Bld: 122 mg/dL — ABNORMAL HIGH (ref 70–99)
Potassium: 3.1 mmol/L — ABNORMAL LOW (ref 3.5–5.1)
Sodium: 140 mmol/L (ref 135–145)
Total Bilirubin: 0.4 mg/dL (ref 0.3–1.2)
Total Protein: 6.8 g/dL (ref 6.5–8.1)

## 2022-08-07 LAB — PREGNANCY, URINE: Preg Test, Ur: POSITIVE — AB

## 2022-08-07 LAB — CBC
HCT: 35.3 % — ABNORMAL LOW (ref 36.0–46.0)
Hemoglobin: 12.4 g/dL (ref 12.0–15.0)
MCH: 31.3 pg (ref 26.0–34.0)
MCHC: 35.1 g/dL (ref 30.0–36.0)
MCV: 89.1 fL (ref 80.0–100.0)
Platelets: 211 10*3/uL (ref 150–400)
RBC: 3.96 MIL/uL (ref 3.87–5.11)
RDW: 13.2 % (ref 11.5–15.5)
WBC: 3.7 10*3/uL — ABNORMAL LOW (ref 4.0–10.5)
nRBC: 0 % (ref 0.0–0.2)

## 2022-08-07 LAB — URINALYSIS, ROUTINE W REFLEX MICROSCOPIC
Bilirubin Urine: NEGATIVE
Glucose, UA: NEGATIVE mg/dL
Leukocytes,Ua: NEGATIVE
Nitrite: NEGATIVE
RBC / HPF: >50 RBC/hpf — ABNORMAL HIGH (ref 0–5)
Specific Gravity, Urine: 1.015 (ref 1.005–1.030)
pH: 5.5 (ref 5.0–8.0)

## 2022-08-07 LAB — LIPASE, BLOOD: Lipase: 12 U/L (ref 11–51)

## 2022-08-07 LAB — HCG, QUANTITATIVE, PREGNANCY: hCG, Beta Chain, Quant, S: 741 m[IU]/mL — ABNORMAL HIGH (ref <5)

## 2022-08-07 LAB — ABO/RH: ABO/RH(D): A POS

## 2022-08-07 SURGERY — SALPINGECTOMY, UNILATERAL, LAPAROSCOPIC
Anesthesia: General | Laterality: Right

## 2022-08-07 MED ORDER — FENTANYL CITRATE (PF) 250 MCG/5ML IJ SOLN
INTRAMUSCULAR | Status: DC | PRN
  Administered 2022-08-07: 100 ug via INTRAVENOUS

## 2022-08-07 MED ORDER — LIDOCAINE 2% (20 MG/ML) 5 ML SYRINGE
INTRAMUSCULAR | Status: DC | PRN
  Administered 2022-08-07: 60 mg via INTRAVENOUS

## 2022-08-07 MED ORDER — OXYCODONE HCL 5 MG/5ML PO SOLN
ORAL | Status: AC
  Administered 2022-08-07: 5 mg
  Filled 2022-08-07: qty 5

## 2022-08-07 MED ORDER — ONDANSETRON HCL 4 MG/2ML IJ SOLN
4.0000 mg | Freq: Once | INTRAMUSCULAR | Status: AC
  Administered 2022-08-07: 4 mg via INTRAVENOUS
  Filled 2022-08-07: qty 2

## 2022-08-07 MED ORDER — HYDROMORPHONE HCL 1 MG/ML IJ SOLN
1.0000 mg | Freq: Once | INTRAMUSCULAR | Status: AC
  Administered 2022-08-07: 1 mg via INTRAVENOUS
  Filled 2022-08-07: qty 1

## 2022-08-07 MED ORDER — SODIUM CHLORIDE 0.9 % IR SOLN
Status: DC | PRN
  Administered 2022-08-07: 3000 mL

## 2022-08-07 MED ORDER — PROPOFOL 10 MG/ML IV BOLUS
INTRAVENOUS | Status: DC | PRN
  Administered 2022-08-07: 150 mg via INTRAVENOUS

## 2022-08-07 MED ORDER — FENTANYL CITRATE (PF) 250 MCG/5ML IJ SOLN
INTRAMUSCULAR | Status: AC
  Filled 2022-08-07: qty 5

## 2022-08-07 MED ORDER — BUPIVACAINE HCL (PF) 0.25 % IJ SOLN
INTRAMUSCULAR | Status: DC | PRN
  Administered 2022-08-07: 30 mL

## 2022-08-07 MED ORDER — DIPHENHYDRAMINE HCL 50 MG/ML IJ SOLN
INTRAMUSCULAR | Status: DC | PRN
  Administered 2022-08-07: 12.5 mg via INTRAVENOUS

## 2022-08-07 MED ORDER — DEXAMETHASONE SODIUM PHOSPHATE 10 MG/ML IJ SOLN
INTRAMUSCULAR | Status: AC
  Filled 2022-08-07: qty 1

## 2022-08-07 MED ORDER — ACETAMINOPHEN 10 MG/ML IV SOLN
INTRAVENOUS | Status: AC
  Filled 2022-08-07: qty 100

## 2022-08-07 MED ORDER — CHLORHEXIDINE GLUCONATE 0.12 % MT SOLN
OROMUCOSAL | Status: AC
  Administered 2022-08-07: 15 mL via OROMUCOSAL
  Filled 2022-08-07: qty 15

## 2022-08-07 MED ORDER — KETOROLAC TROMETHAMINE 30 MG/ML IJ SOLN
INTRAMUSCULAR | Status: DC | PRN
  Administered 2022-08-07: 30 mg via INTRAVENOUS

## 2022-08-07 MED ORDER — DIPHENHYDRAMINE HCL 50 MG/ML IJ SOLN
INTRAMUSCULAR | Status: AC
  Filled 2022-08-07: qty 1

## 2022-08-07 MED ORDER — ROCURONIUM BROMIDE 10 MG/ML (PF) SYRINGE
PREFILLED_SYRINGE | INTRAVENOUS | Status: DC | PRN
  Administered 2022-08-07: 50 mg via INTRAVENOUS

## 2022-08-07 MED ORDER — LACTATED RINGERS IV SOLN: INTRAVENOUS | Status: DC

## 2022-08-07 MED ORDER — ORAL CARE MOUTH RINSE: 15.0000 mL | Freq: Once | OROMUCOSAL | Status: AC

## 2022-08-07 MED ORDER — MEPERIDINE HCL 50 MG/5ML PO SOLN: 25.0000 mg | Freq: Once | ORAL | Status: DC

## 2022-08-07 MED ORDER — LIDOCAINE 2% (20 MG/ML) 5 ML SYRINGE
INTRAMUSCULAR | Status: AC
  Filled 2022-08-07: qty 5

## 2022-08-07 MED ORDER — ONDANSETRON HCL 4 MG/2ML IJ SOLN
INTRAMUSCULAR | Status: DC | PRN
  Administered 2022-08-07: 4 mg via INTRAVENOUS

## 2022-08-07 MED ORDER — MIDAZOLAM HCL 2 MG/2ML IJ SOLN
INTRAMUSCULAR | Status: AC
  Filled 2022-08-07: qty 2

## 2022-08-07 MED ORDER — SODIUM CHLORIDE 0.9 % IV BOLUS
1000.0000 mL | Freq: Once | INTRAVENOUS | Status: AC
  Administered 2022-08-07: 1000 mL via INTRAVENOUS

## 2022-08-07 MED ORDER — SUGAMMADEX SODIUM 200 MG/2ML IV SOLN
INTRAVENOUS | Status: DC | PRN
  Administered 2022-08-07 (×2): 100 mg via INTRAVENOUS

## 2022-08-07 MED ORDER — MEPERIDINE HCL 25 MG/ML IJ SOLN
6.2500 mg | INTRAMUSCULAR | Status: DC | PRN
  Administered 2022-08-07: 25 mg via INTRAVENOUS

## 2022-08-07 MED ORDER — SUCCINYLCHOLINE CHLORIDE 200 MG/10ML IV SOSY
PREFILLED_SYRINGE | INTRAVENOUS | Status: DC | PRN
  Administered 2022-08-07: 80 mg via INTRAVENOUS

## 2022-08-07 MED ORDER — HYDROMORPHONE HCL 1 MG/ML IJ SOLN: 0.2500 mg | INTRAMUSCULAR | Status: DC | PRN

## 2022-08-07 MED ORDER — SUCCINYLCHOLINE CHLORIDE 200 MG/10ML IV SOSY
PREFILLED_SYRINGE | INTRAVENOUS | Status: AC
  Filled 2022-08-07: qty 10

## 2022-08-07 MED ORDER — ROCURONIUM BROMIDE 10 MG/ML (PF) SYRINGE
PREFILLED_SYRINGE | INTRAVENOUS | Status: AC
  Filled 2022-08-07: qty 10

## 2022-08-07 MED ORDER — OXYCODONE HCL 5 MG PO TABS: 5.0000 mg | ORAL_TABLET | Freq: Four times a day (QID) | ORAL | 0 refills | Status: DC | PRN

## 2022-08-07 MED ORDER — ONDANSETRON HCL 4 MG/2ML IJ SOLN
INTRAMUSCULAR | Status: AC
  Filled 2022-08-07: qty 2

## 2022-08-07 MED ORDER — PHENYLEPHRINE 80 MCG/ML (10ML) SYRINGE FOR IV PUSH (FOR BLOOD PRESSURE SUPPORT)
PREFILLED_SYRINGE | INTRAVENOUS | Status: AC
  Filled 2022-08-07: qty 10

## 2022-08-07 MED ORDER — PROMETHAZINE HCL 25 MG/ML IJ SOLN: 6.2500 mg | INTRAMUSCULAR | Status: DC | PRN

## 2022-08-07 MED ORDER — MIDAZOLAM HCL 2 MG/2ML IJ SOLN
INTRAMUSCULAR | Status: DC | PRN
  Administered 2022-08-07: 2 mg via INTRAVENOUS

## 2022-08-07 MED ORDER — ACETAMINOPHEN 10 MG/ML IV SOLN
INTRAVENOUS | Status: DC | PRN
  Administered 2022-08-07: 1000 mg via INTRAVENOUS

## 2022-08-07 MED ORDER — DEXAMETHASONE SODIUM PHOSPHATE 10 MG/ML IJ SOLN
INTRAMUSCULAR | Status: DC | PRN
  Administered 2022-08-07: 5 mg via INTRAVENOUS

## 2022-08-07 MED ORDER — MEPERIDINE HCL 25 MG/ML IJ SOLN
INTRAMUSCULAR | Status: AC
  Filled 2022-08-07: qty 1

## 2022-08-07 MED ORDER — CHLORHEXIDINE GLUCONATE 0.12 % MT SOLN: 15.0000 mL | Freq: Once | OROMUCOSAL | Status: AC

## 2022-08-07 MED ORDER — KETOROLAC TROMETHAMINE 30 MG/ML IJ SOLN
INTRAMUSCULAR | Status: AC
  Filled 2022-08-07: qty 1

## 2022-08-07 SURGICAL SUPPLY — 30 items
DERMABOND ADVANCED .7 DNX12 (GAUZE/BANDAGES/DRESSINGS) ×2 IMPLANT
DRSG OPSITE POSTOP 3X4 (GAUZE/BANDAGES/DRESSINGS) ×1 IMPLANT
DURAPREP 26ML APPLICATOR (WOUND CARE) ×2 IMPLANT
GLOVE BIOGEL PI IND STRL 7.0 (GLOVE) ×8 IMPLANT
GLOVE ECLIPSE 7.0 STRL STRAW (GLOVE) ×2 IMPLANT
GLOVE SURG ENC MOIS LTX SZ7 (GLOVE) ×2 IMPLANT
GOWN STRL REUS W/ TWL LRG LVL3 (GOWN DISPOSABLE) ×6 IMPLANT
GOWN STRL REUS W/TWL LRG LVL3 (GOWN DISPOSABLE) ×6
IRRIG SUCT STRYKERFLOW 2 WTIP (MISCELLANEOUS) ×2
IRRIGATION SUCT STRKRFLW 2 WTP (MISCELLANEOUS) ×2 IMPLANT
KIT TURNOVER KIT B (KITS) ×2 IMPLANT
PACK LAPAROSCOPY BASIN (CUSTOM PROCEDURE TRAY) ×2 IMPLANT
PACK TRENDGUARD 450 HYBRID PRO (MISCELLANEOUS) ×1 IMPLANT
POUCH SPECIMEN RETRIEVAL 10MM (ENDOMECHANICALS) ×1 IMPLANT
PROTECTOR NERVE ULNAR (MISCELLANEOUS) ×4 IMPLANT
SET TUBE SMOKE EVAC HIGH FLOW (TUBING) ×2 IMPLANT
SHEARS HARMONIC ACE PLUS 36CM (ENDOMECHANICALS) ×1 IMPLANT
SLEEVE ENDOPATH XCEL 5M (ENDOMECHANICALS) ×2 IMPLANT
SPONGE T-LAP 18X18 ~~LOC~~+RFID (SPONGE) ×2 IMPLANT
SUT VIC AB 3-0 PS2 18 (SUTURE) ×2
SUT VIC AB 3-0 PS2 18XBRD (SUTURE) ×2 IMPLANT
SUT VICRYL 0 UR6 27IN ABS (SUTURE) ×4 IMPLANT
SUT VICRYL 4-0 PS2 18IN ABS (SUTURE) ×2 IMPLANT
TOWEL GREEN STERILE FF (TOWEL DISPOSABLE) ×4 IMPLANT
TRAY FOLEY W/BAG SLVR 14FR (SET/KITS/TRAYS/PACK) ×2 IMPLANT
TRENDGUARD 450 HYBRID PRO PACK (MISCELLANEOUS) ×2
TROCAR BALLN 12MMX100 BLUNT (TROCAR) ×2 IMPLANT
TROCAR XCEL NON-BLD 11X100MML (ENDOMECHANICALS) ×1 IMPLANT
TROCAR XCEL NON-BLD 5MMX100MML (ENDOMECHANICALS) ×2 IMPLANT
WARMER LAPAROSCOPE (MISCELLANEOUS) ×2 IMPLANT

## 2022-08-07 NOTE — MAU Note (Signed)
Received pt from [place]. Ruptured ectopic. Surgery pending. V/SS. Pt comfortable rated pain 2/10. Awaiting Dr. Kennon Rounds. To consent pt.

## 2022-08-07 NOTE — Anesthesia Procedure Notes (Signed)
Procedure Name: Intubation Date/Time: 08/07/2022 3:34 PM  Performed by: Trinna Post., CRNAPre-anesthesia Checklist: Patient identified, Emergency Drugs available, Suction available, Patient being monitored and Timeout performed Patient Re-evaluated:Patient Re-evaluated prior to induction Oxygen Delivery Method: Circle system utilized Preoxygenation: Pre-oxygenation with 100% oxygen Induction Type: IV induction, Rapid sequence and Cricoid Pressure applied Laryngoscope Size: Mac and 3 Grade View: Grade I Tube type: Oral Tube size: 7.0 mm Number of attempts: 1 Airway Equipment and Method: Stylet Placement Confirmation: ETT inserted through vocal cords under direct vision, positive ETCO2 and breath sounds checked- equal and bilateral Secured at: 22 cm Tube secured with: Tape Dental Injury: Teeth and Oropharynx as per pre-operative assessment

## 2022-08-07 NOTE — Anesthesia Preprocedure Evaluation (Addendum)
Anesthesia Evaluation  Patient identified by MRN, date of birth, ID band Patient awake    Reviewed: Allergy & Precautions, NPO status , Patient's Chart, lab work & pertinent test results  History of Anesthesia Complications Negative for: history of anesthetic complications  Airway Mallampati: I  TM Distance: >3 FB Neck ROM: Full    Dental no notable dental hx. (+) Dental Advisory Given, Teeth Intact   Pulmonary neg pulmonary ROS,    Pulmonary exam normal breath sounds clear to auscultation       Cardiovascular negative cardio ROS Normal cardiovascular exam Rhythm:Regular Rate:Normal  Echo 10/2021 1. Left ventricular ejection fraction, by estimation, is 55 to 60%. The left ventricle has normal function. The left ventricle has no regional wall motion abnormalities. Left ventricular diastolic parameters were  normal.  2. Right ventricular systolic function is normal. The right ventricular size is normal.  3. The mitral valve is normal in structure. Trivial mitral valve regurgitation.  4. The aortic valve is tricuspid. Aortic valve regurgitation is not visualized. No aortic stenosis is present.  5. The inferior vena cava is normal in size with greater than 50% respiratory variability, suggesting right atrial pressure of 3 mmHg.   Comparison(s): No prior Echocardiogram.    Neuro/Psych negative neurological ROS  negative psych ROS   GI/Hepatic negative GI ROS, Neg liver ROS,   Endo/Other  negative endocrine ROS  Renal/GU negative Renal ROS     Musculoskeletal negative musculoskeletal ROS (+)   Abdominal   Peds  Hematology negative hematology ROS (+)   Anesthesia Other Findings RIGHT BREAST CANCER  Reproductive/Obstetrics (+) Pregnancy Ruptured ectopic                            Anesthesia Physical  Anesthesia Plan  ASA: 2 and emergent  Anesthesia Plan: General   Post-op Pain  Management: Toradol IV (intra-op)* and Ofirmev IV (intra-op)*   Induction: Intravenous  PONV Risk Score and Plan: 3 and Treatment may vary due to age or medical condition, Midazolam, Dexamethasone, Ondansetron and Scopolamine patch - Pre-op  Airway Management Planned: Oral ETT  Additional Equipment: None  Intra-op Plan:   Post-operative Plan: Extubation in OR  Informed Consent: I have reviewed the patients History and Physical, chart, labs and discussed the procedure including the risks, benefits and alternatives for the proposed anesthesia with the patient or authorized representative who has indicated his/her understanding and acceptance.     Dental advisory given  Plan Discussed with: CRNA  Anesthesia Plan Comments:        Anesthesia Quick Evaluation

## 2022-08-07 NOTE — ED Triage Notes (Signed)
She c/o low abd. Cramping and this morning she started spotting this morning. This week she found out she was pregnant at her radiation visit (breast cancer). She is in no distress. Her husband is with her. PARA II GRAV IV.

## 2022-08-07 NOTE — Anesthesia Postprocedure Evaluation (Signed)
Anesthesia Post Note  Patient: Monica [name]  Procedure(s) Performed: LAPAROSCOPIC RIGHT SALPINGECTOMY WITH REMOVAL OF ECTOPIC PREGNANCY (Right)     Patient location during evaluation: PACU Anesthesia Type: General Level of consciousness: sedated and patient cooperative Pain management: pain level controlled Vital Signs Assessment: post-procedure vital signs reviewed and stable Respiratory status: spontaneous breathing Cardiovascular status: stable Anesthetic complications: no   No notable events documented.  Last Vitals:  Vitals:   08/07/22 1710 08/07/22 1725  BP: 117/79 121/81  Pulse: 85 75  Resp: 20 19  Temp:  36.8 C  SpO2: 99% 97%    Last Pain:  Vitals:   08/07/22 1725  PainSc: 0-No pain                 Nolon Nations

## 2022-08-07 NOTE — H&P (Signed)
Monica [name] is an 26 y.o. No obstetric history on file. female.   Chief Complaint: abdominal pain HPI: Pt. Here with ruptured ectopic pregnancy. For surgical management.  Past Medical History:  Diagnosis Date   Abscess    Cancer ([place]) 10/16/2021   right breast cancer    Past Surgical History:  Procedure Laterality Date   BREAST RECONSTRUCTION WITH PLACEMENT OF TISSUE EXPANDER AND FLEX HD (ACELLULAR HYDRATED DERMIS) Bilateral 05/04/2022   Procedure: BREAST RECONSTRUCTION WITH PLACEMENT OF TISSUE EXPANDER AND FLEX HD (ACELLULAR HYDRATED DERMIS);  Surgeon: Lennice Sites, MD;  Location: [place];  Service: Plastics;  Laterality: Bilateral;   MASTECTOMY W/ SENTINEL NODE BIOPSY Bilateral 05/04/2022   Procedure: BILATERAL SKIN SPARING MASTECTOMIES, RIGHT SEED LOCALIZED LYMPH NODE BIOPSY, RIGHT SENTINEL NODE BIOPSY;  Surgeon: Stark Klein, MD;  Location: [place];  Service: General;  Laterality: Bilateral;   PORT-A-CATH REMOVAL Left 05/04/2022   Procedure: PORT REMOVAL;  Surgeon: Stark Klein, MD;  Location: [place];  Service: General;  Laterality: Left;   PORTACATH PLACEMENT Left 10/27/2021   Procedure: INSERTION PORT-A-CATH;  Surgeon: Stark Klein, MD;  Location: [place];  Service: General;  Laterality: Left;    Family History  Problem Relation Age of Onset   Breast cancer Maternal Grandmother 73   Social History:  reports that she has never smoked. She has never used smokeless tobacco. She reports that she does not drink alcohol and does not use drugs.  Allergies: No Known Allergies  Medications Prior to Admission  Medication Sig Dispense Refill   acetaminophen (TYLENOL) 500 MG tablet Take 1,000 mg by mouth daily as needed for headache or mild pain.     cyclobenzaprine (FLEXERIL) 5 MG tablet Take 1 tablet (5 mg total) by mouth 3 (three) times daily as needed for muscle spasms. 60 tablet 1   gabapentin (NEURONTIN) 100 MG capsule Take 2 capsules (200 mg total) by mouth 2 (two) times daily.  120 capsule 1   tamoxifen (NOLVADEX) 20 MG tablet Take 1 tablet (20 mg total) by mouth daily. 90 tablet 3    Pertinent items are noted in HPI.  Blood pressure 117/71, pulse 86, temperature 98.2 F (36.8 C), resp. rate (P) 18, SpO2 98 %. General appearance: alert, cooperative, and appears stated age Head: Normocephalic, without obvious abnormality, atraumatic Neck: supple, symmetrical, trachea midline Lungs:  normal effort Heart: regular rate and rhythm Abdomen:  soft, diffusely tender Extremities: Homans sign is negative, no sign of DVT Skin: Skin color, texture, turgor normal. No rashes or lesions Neurologic: Grossly normal   Results for orders placed or performed during the hospital encounter of 08/07/22 (from the past 24 hour(s))  Urinalysis, Routine w reflex microscopic Urine, Clean Catch     Status: Abnormal   Collection Time: 08/07/22  8:57 AM  Result Value Ref Range   Color, Urine ORANGE (A) YELLOW   APPearance HAZY (A) CLEAR   Specific Gravity, Urine 1.015 1.005 - 1.030   pH 5.5 5.0 - 8.0   Glucose, UA NEGATIVE NEGATIVE mg/dL   Hgb urine dipstick LARGE (A) NEGATIVE   Bilirubin Urine NEGATIVE NEGATIVE   Ketones, ur TRACE (A) NEGATIVE mg/dL   Protein, ur TRACE (A) NEGATIVE mg/dL   Nitrite NEGATIVE NEGATIVE   Leukocytes,Ua NEGATIVE NEGATIVE   RBC / HPF >50 (H) 0 - 5 RBC/hpf   WBC, UA 6-10 0 - 5 WBC/hpf   Bacteria, UA RARE (A) NONE SEEN   Squamous Epithelial / LPF 0-5 0 - 5   Mucus  PRESENT   Pregnancy, urine     Status: Abnormal   Collection Time: 08/07/22  8:57 AM  Result Value Ref Range   Preg Test, Ur POSITIVE (A) NEGATIVE  hCG, quantitative, pregnancy     Status: Abnormal   Collection Time: 08/07/22  8:57 AM  Result Value Ref Range   hCG, Beta Chain, Quant, S 741 (H) <5 mIU/mL  Lipase, blood     Status: None   Collection Time: 08/07/22  9:00 AM  Result Value Ref Range   Lipase 12 11 - 51 U/L  Comprehensive metabolic panel     Status: Abnormal   Collection  Time: 08/07/22  9:00 AM  Result Value Ref Range   Sodium 140 135 - 145 mmol/L   Potassium 3.1 (L) 3.5 - 5.1 mmol/L   Chloride 103 98 - 111 mmol/L   CO2 23 22 - 32 mmol/L   Glucose, Bld 122 (H) 70 - 99 mg/dL   BUN 12 6 - 20 mg/dL   Creatinine, Ser 0.62 0.44 - 1.00 mg/dL   Calcium 9.8 8.9 - 10.3 mg/dL   Total Protein 6.8 6.5 - 8.1 g/dL   Albumin 4.7 3.5 - 5.0 g/dL   AST 14 (L) 15 - 41 U/L   ALT 5 0 - 44 U/L   Alkaline Phosphatase 43 38 - 126 U/L   Total Bilirubin 0.4 0.3 - 1.2 mg/dL   GFR, Estimated >60 >60 mL/min   Anion gap 14 5 - 15  CBC     Status: Abnormal   Collection Time: 08/07/22  9:00 AM  Result Value Ref Range   WBC 3.7 (L) 4.0 - 10.5 K/uL   RBC 3.96 3.87 - 5.11 MIL/uL   Hemoglobin 12.4 12.0 - 15.0 g/dL   HCT 35.3 (L) 36.0 - 46.0 %   MCV 89.1 80.0 - 100.0 fL   MCH 31.3 26.0 - 34.0 pg   MCHC 35.1 30.0 - 36.0 g/dL   RDW 13.2 11.5 - 15.5 %   Platelets 211 150 - 400 K/uL   nRBC 0.0 0.0 - 0.2 %   US OB LESS THAN 14 WEEKS WITH OB TRANSVAGINAL  Result Date: 08/07/2022 CLINICAL DATA:  Pelvis, low back pain, vaginal spotting today. EXAM: OBSTETRIC <14 WK [place] AND TRANSVAGINAL OB US TECHNIQUE: Both transabdominal and transvaginal ultrasound examinations were performed for complete evaluation of the gestation as well as the maternal uterus, adnexal regions, and pelvic cul-de-sac. Transvaginal technique was performed to assess early pregnancy. COMPARISON:  None Available. FINDINGS: Intrauterine gestational sac: None Yolk sac:  Not Visualized. Embryo:  Not Visualized. Subchorionic hemorrhage:  None visualized. Maternal uterus/adnexae: Hyperechoic ring-like structure within the RIGHT adnexal region, separate from the RIGHT ovary, compatible with ectopic pregnancy, suspected tubal location. Moderate amount of free fluid in the RIGHT adnexal region and cul-de-sac, mostly complex and compatible with blood products indicating ruptured ectopic. Maternal RIGHT ovary, separate from the presumed  ectopic pregnancy, appears normal. Maternal LEFT ovary is unremarkable. No mass is seen in the LEFT adnexal region. IMPRESSION: 1. Ectopic pregnancy within the RIGHT adnexal region, suspected tubal location. 2. Moderate amount of free fluid in the RIGHT adnexal region and cul-de-sac, mostly complex and compatible with blood products indicating ruptured ectopic pregnancy. 3. No intrauterine pregnancy. Critical Value/emergent results were called by telephone at the time of interpretation on 08/07/2022 at 12:05 pm to provider [facility] , who verbally acknowledged these results. Electronically Signed   By: Franki Cabot M.D.   On: 08/07/2022 12:09  Assessment/Plan Active Problems:   Hemoperitoneum due to rupture of tubal ectopic pregnancy  For laparoscopic salpingectomy Risks include but are not limited to bleeding, infection, injury to surrounding structures, including bowel, bladder and ureters, blood clots, and death.  Likelihood of success is high.    Donnamae Jude 08/07/2022, 2:39 PM

## 2022-08-07 NOTE — ED Provider Notes (Signed)
[place] EMERGENCY DEPT Provider Note   CSN: [id number] Arrival date & time: 08/07/22  0844     History  Chief Complaint  Patient presents with   Abdominal Pain    Monica [name] is a 26 y.o. female.  HPI Patient had a positive urine pregnancy test 10\19.  This was done in preparation for starting tamoxifen.  Patient was not aware that she was pregnant.  She reports that yesterday she started getting some cramping pain in her right lower quadrant.  Today she reports the pain is worse is cramping and aching and also now into the lower back.  Reports while she was getting her chemo and radiation therapy, she did not have menstrual cycles but restarted having menstrual cycles August and September and then did not have onset in October.  Reports she has felt nauseated and has some dry heaves.  She reports she did feel lightheaded when she got up to go to the bathroom but now that she is resting she feels better.  No syncopal episode.    Home Medications Prior to Admission medications   Medication Sig Start Date End Date Taking? Authorizing Provider  acetaminophen (TYLENOL) 500 MG tablet Take 1,000 mg by mouth daily as needed for headache or mild pain.    [provider]  cyclobenzaprine (FLEXERIL) 5 MG tablet Take 1 tablet (5 mg total) by mouth 3 (three) times daily as needed for muscle spasms. 05/05/22   Stark Klein, MD  gabapentin (NEURONTIN) 100 MG capsule Take 2 capsules (200 mg total) by mouth 2 (two) times daily. 05/05/22   Stark Klein, MD  tamoxifen (NOLVADEX) 20 MG tablet Take 1 tablet (20 mg total) by mouth daily. 08/05/22   [name], MD  prochlorperazine (COMPAZINE) 10 MG tablet Take 1 tablet (10 mg total) by mouth every 6 (six) hours as needed (Nausea or vomiting). Patient not taking: Reported on 03/04/2022 10/16/21 03/18/22  [name], MD      Allergies    Patient has no known allergies.    Review of Systems   Review of  Systems  Physical Exam Updated Vital Signs BP 122/80 (BP Location: Left Arm)   Pulse 79   Temp 98.2 F (36.8 C)   Resp 18   LMP  (LMP Unknown)   SpO2 98%  Physical Exam Constitutional:      Comments: Alert, nontoxic, well-nourished well-developed.  Appears very uncomfortable.  HENT:     Head: Normocephalic and atraumatic.  Eyes:     Extraocular Movements: Extraocular movements intact.  Cardiovascular:     Rate and Rhythm: Regular rhythm. Tachycardia present.  Pulmonary:     Effort: Pulmonary effort is normal.     Breath sounds: Normal breath sounds.  Abdominal:     Comments: Abdomen soft.  Moderate right lower quadrant pain to palpation without guarding.  Musculoskeletal:        General: No swelling or tenderness. Normal range of motion.     Right lower leg: No edema.     Left lower leg: No edema.  Skin:    General: Skin is warm and dry.  Neurological:     General: No focal deficit present.     Mental Status: She is oriented to person, place, and time.     Coordination: Coordination normal.  Psychiatric:        Mood and Affect: Mood normal.     ED Results / Procedures / Treatments   Labs (all labs ordered are listed, but only  abnormal results are displayed) Labs Reviewed  COMPREHENSIVE METABOLIC PANEL - Abnormal; Notable for the following components:      Result Value   Potassium 3.1 (*)    Glucose, Bld 122 (*)    AST 14 (*)    All other components within normal limits  CBC - Abnormal; Notable for the following components:   WBC 3.7 (*)    HCT 35.3 (*)    All other components within normal limits  URINALYSIS, ROUTINE W REFLEX MICROSCOPIC - Abnormal; Notable for the following components:   Color, Urine ORANGE (*)    APPearance HAZY (*)    Hgb urine dipstick LARGE (*)    Ketones, ur TRACE (*)    Protein, ur TRACE (*)    RBC / HPF >50 (*)    Bacteria, UA RARE (*)    All other components within normal limits  PREGNANCY, URINE - Abnormal; Notable for the  following components:   Preg Test, Ur POSITIVE (*)    All other components within normal limits  HCG, QUANTITATIVE, PREGNANCY - Abnormal; Notable for the following components:   hCG, Beta Chain, Quant, S 741 (*)    All other components within normal limits  LIPASE, BLOOD  ABO/RH    EKG None  Radiology US OB LESS THAN 14 WEEKS WITH OB TRANSVAGINAL  Result Date: 08/07/2022 CLINICAL DATA:  Pelvis, low back pain, vaginal spotting today. EXAM: OBSTETRIC <14 WK [place] AND TRANSVAGINAL OB US TECHNIQUE: Both transabdominal and transvaginal ultrasound examinations were performed for complete evaluation of the gestation as well as the maternal uterus, adnexal regions, and pelvic cul-de-sac. Transvaginal technique was performed to assess early pregnancy. COMPARISON:  None Available. FINDINGS: Intrauterine gestational sac: None Yolk sac:  Not Visualized. Embryo:  Not Visualized. Subchorionic hemorrhage:  None visualized. Maternal uterus/adnexae: Hyperechoic ring-like structure within the RIGHT adnexal region, separate from the RIGHT ovary, compatible with ectopic pregnancy, suspected tubal location. Moderate amount of free fluid in the RIGHT adnexal region and cul-de-sac, mostly complex and compatible with blood products indicating ruptured ectopic. Maternal RIGHT ovary, separate from the presumed ectopic pregnancy, appears normal. Maternal LEFT ovary is unremarkable. No mass is seen in the LEFT adnexal region. IMPRESSION: 1. Ectopic pregnancy within the RIGHT adnexal region, suspected tubal location. 2. Moderate amount of free fluid in the RIGHT adnexal region and cul-de-sac, mostly complex and compatible with blood products indicating ruptured ectopic pregnancy. 3. No intrauterine pregnancy. Critical Value/emergent results were called by telephone at the time of interpretation on 08/07/2022 at 12:05 pm to provider [facility] , who verbally acknowledged these results. Electronically Signed   By: Franki Cabot  M.D.   On: 08/07/2022 12:09    Procedures Procedures   CRITICAL CARE Performed by: Charlesetta Shanks   Total critical care time: 30 minutes  Critical care time was exclusive of separately billable procedures and treating other patients.  Critical care was necessary to treat or prevent imminent or life-threatening deterioration.  Critical care was time spent personally by me on the following activities: development of treatment plan with patient and/or surrogate as well as nursing, discussions with consultants, evaluation of patient's response to treatment, examination of patient, obtaining history from patient or surrogate, ordering and performing treatments and interventions, ordering and review of laboratory studies, ordering and review of radiographic studies, pulse oximetry and re-evaluation of patient's condition.  Medications Ordered in ED Medications  HYDROmorphone (DILAUDID) injection 1 mg (has no administration in time range)  HYDROmorphone (DILAUDID) injection 1 mg (1  mg Intravenous Given 08/07/22 1027)  ondansetron (ZOFRAN) injection 4 mg (4 mg Intravenous Given 08/07/22 1025)  sodium chloride 0.9 % bolus 1,000 mL (1,000 mLs Intravenous New Bag/Given 08/07/22 1025)    ED Course/ Medical Decision Making/ A&P                           Medical Decision Making Amount and/or Complexity of Data Reviewed Labs: ordered. Radiology: ordered.  Risk Prescription drug management. Decision regarding hospitalization.   Patient has medical history complicated by right breast cancer:Stage IIA (cT3, cN1, cM0, G2, ER+, PR+, HER2: Equivocal) treated with chemotherapy and adjuvant radiation therapy, last radiation therapy 2 days ago.  Patient was to be started on long-term Taxol.  In preparation, a pregnancy test was done and returned +2 days ago.  Patient started having pain yesterday.  Concern is for ectopic pregnancy or other possible pregnancy related complication.  Possibly unrelated  differential would include appendicitis\colitis\ovarian torsion.  We will proceed with pain control, quantitative hCG, blood counts and pelvic ultrasound.  Quantitative hCG  741.  Pelvic ultrasound interpreted by radiology positive for ectopic on the right.  Probable rupture with some free fluid in the pelvis.  Consult: 12: 15 reviewed with Dr. Kennon Rounds.  We will transfer patient directly to MAU for surgical intervention.  Patient is alert.  She has had decent pain control with Dilaudid.  Will administer 1 more dose.  She remains stable.  Blood pressures at rest are 122/80 and heart rate is 79.  Hemoglobin is at 12.4g/dl.  Patient updated on diagnosis and plan.  Her husband is at bedside.  They understand and are aware of the plan and management with transport to MAU.        Final Clinical Impression(s) / ED Diagnoses Final diagnoses:  Ruptured ectopic pregnancy    Rx / DC Orders ED Discharge Orders     None         Charlesetta Shanks, MD 08/07/22 1227

## 2022-08-07 NOTE — Op Note (Signed)
PREOPERATIVE DIAGNOSIS: Probable ruptured ectopic pregnancy  POSTOPERATIVE DIAGNOSIS: Same  PROCEDURE: Laparoscopic right salpingectomy   INDICATIONS: 26 y.o. G3P2 at Unknown here for with ruptured ectopic pregnancy with blood type A pos. Patient was counseled regarding need for laparoscopic salpingectomy. Risks of surgery including bleeding which may require transfusion or reoperation, infection, injury to bowel or other surrounding organs, need for additional procedures including laparotomy and other postoperative/anesthesia complications were explained to patient.  Written informed consent was obtained  FINDINGS: moderate amount of hemoperitoneum estimated to be about 200cc of blood and clots.  Dilated right fallopian tube containing ectopic gestation. Small normal appearing uterus, normal left fallopian tube, right ovary and left ovary.  ANESTHESIA: General  ASSISTANT: Silas Sacramento, MD An experienced assistant was required given the standard of surgical care given the complexity of the case.  This assistant was needed for exposure, dissection, suctioning, retraction, instrument exchange, and for overall help during the procedure.  SPECIMENS: right fallopian tube to pathology  COMPLICATIONS: None immediate  PROCEDURE IN DETAIL:  The patient was taken to the operating room where general anesthesia was administered and was found to be adequate.  She was placed in the dorsal lithotomy position, and was prepped and draped in a sterile manner.  A Foley catheter was inserted into her bladder and attached to constant drainage and a uterine manipulator was then advanced into the uterus .  After an adequate timeout was performed, attention was then turned to the patient's abdomen where a 10-mm skin incision was made in the umbilical fold. Fascia and peritoneum were entered sharply.  A 0 Vicryl suture was used to tag the fascia circumferentially.  A Hassan trocar was placed. The laparoscope was  introduced.  A survey of the patient's pelvis and abdomen revealed the findings as above.  Two  lower quadrant ports were placed under direct visualization; 5-mm x 2 on the right and the left.  Attention was then turned to the right fallopian tube which was grasped and ligated from the underlying mesosalpinx and uterine attachment using the Harmonic instrument. The mesosalpinx was bleeding a bit at the midportion and cautery with the Harmonic device used for hemostasis. Good hemostasis was noted.  The specimen was placed in an EndoCatch bag and removed from the abdomen intact. Clot and blood removed with the [place]. The abdomen was desufflated, and all instruments were removed.  The umbilicus incision was closed with the afore mentioned Vicryl suture; and all skin incisions were closed with a 3-0 Vicryl subcuticular stitch followed by DermaBond. The patient tolerated the procedure well.  All instruments, needles, and sponge counts were correct x 2. The patient was taken to the recovery room in stable condition.   Donnamae Jude MD 08/07/2022 4:21 PM

## 2022-08-07 NOTE — ED Notes (Signed)
Report given to [facility] 

## 2022-08-07 NOTE — MAU Provider Note (Signed)
Event Date/Time   First Provider Initiated Contact with Patient 08/07/22 1408      S Ms. Monica [name] is a 26 y.o.  patient who presents to MAU via [place] for pre-op preparation s/p diagnosis of ruptured right ectopic pregnancy.  O BP 117/71   Pulse 86   Temp 98.2 F (36.8 C)   Resp 17   LMP  (LMP Unknown)   SpO2 98%   Physical Exam Vitals and nursing note reviewed. Exam conducted with a chaperone present.  Pulmonary:     Effort: Pulmonary effort is normal.  Skin:    Capillary Refill: Capillary refill takes less than 2 seconds.  Neurological:     Mental Status: She is alert and oriented to person, place, and time.  Psychiatric:        Mood and Affect: Mood normal.        Behavior: Behavior normal.     A Medical screening exam complete Pain well-managed on arrival Patient has been NPO since last night OR called for patient at approximately [place] Per Dr. Kennon Rounds, prep for [place], [place], CNM 08/07/2022 2:44 PM

## 2022-08-07 NOTE — Transfer of Care (Signed)
Immediate Anesthesia Transfer of Care Note  Patient: Monica [name]  Procedure(s) Performed: LAPAROSCOPIC RIGHT SALPINGECTOMY WITH REMOVAL OF ECTOPIC PREGNANCY (Right)  Patient Location: PACU  Anesthesia Type:General  Level of Consciousness: awake and drowsy  Airway & Oxygen Therapy: Patient Spontanous Breathing and Patient connected to nasal cannula oxygen  Post-op Assessment: Report given to RN and Post -op Vital signs reviewed and stable  Post vital signs: Reviewed and stable  Last Vitals:  Vitals Value Taken Time  BP 116/75 08/07/22 1645  Temp 36.2 C 08/07/22 1640  Pulse 79 08/07/22 1647  Resp 18 08/07/22 1647  SpO2 98 % 08/07/22 1647  Vitals shown include unvalidated device data.  Last Pain:  Vitals:   08/07/22 1640  PainSc: Asleep         Complications: No notable events documented.

## 2022-08-08 ENCOUNTER — Encounter (HOSPITAL_COMMUNITY): Payer: Self-pay | Admitting: Family Medicine

## 2022-08-09 ENCOUNTER — Ambulatory Visit: Payer: Medicaid Other | Admitting: Plastic Surgery

## 2022-08-10 ENCOUNTER — Ambulatory Visit: Payer: Medicaid Other | Admitting: Surgical

## 2022-08-10 LAB — SURGICAL PATHOLOGY

## 2022-08-10 NOTE — Progress Notes (Unsigned)
   Referring Provider No referring provider defined for this encounter.   CC: No chief complaint on file.     Monica [name] is an 26 y.o. female.  HPI: Patient is a 26 year old female here for follow-up on her bilateral breast reconstruction.  She had bilateral skin sparing mastectomies with Dr. Barry Dienes on 05/04/2022 followed by placement of bilateral breast tissue expanders and ADM with Dr. Erin Hearing.  Of note she had neoadjuvant chemotherapy prior to her bilateral mastectomies.  She subsequently had adjuvant radiation 06/30/2022 until 08/05/2022.  She is currently on adjuvant antiestrogen therapy along with CDK 4 and 6 inhibitors.  She currently has 400 cc/600 cc tissue expanders in place.   Review of Systems General: ***  Physical Exam    08/07/2022    5:25 PM 08/07/2022    5:10 PM 08/07/2022    4:55 PM  Vitals with BMI  Systolic [phone]  Diastolic 81 79 75  Pulse 75 85 81    General:  No acute distress,  Alert and oriented, Non-Toxic, Normal speech and affect ***   Assessment/Plan ***  Monica [name] 08/10/2022, 8:08 AM

## 2022-08-12 ENCOUNTER — Telehealth: Payer: Self-pay | Admitting: General Practice

## 2022-08-12 ENCOUNTER — Other Ambulatory Visit: Payer: Self-pay | Admitting: Family Medicine

## 2022-08-12 ENCOUNTER — Encounter: Payer: Self-pay | Admitting: Family Medicine

## 2022-08-12 NOTE — Telephone Encounter (Signed)
Patient called and left a message requesting a refill on her pain medication

## 2022-08-13 ENCOUNTER — Encounter (HOSPITAL_COMMUNITY)
Admission: RE | Admit: 2022-08-13 | Discharge: 2022-08-13 | Disposition: A | Discharge status: Home / Self Care | Payer: Medicaid Other | Source: Ambulatory Visit | Attending: Hematology and Oncology | Admitting: Hematology and Oncology

## 2022-08-13 DIAGNOSIS — C50411 Malignant neoplasm of upper-outer quadrant of right female breast: Secondary | ICD-10-CM | POA: Insufficient documentation

## 2022-08-13 DIAGNOSIS — Z170 Estrogen receptor positive status [ER+]: Secondary | ICD-10-CM

## 2022-08-13 MED ORDER — TECHNETIUM TC 99M MEDRONATE IV KIT
20.0000 | PACK | Freq: Once | INTRAVENOUS | Status: AC | PRN
  Administered 2022-08-13: 20 via INTRAVENOUS

## 2022-08-13 MED ORDER — OXYCODONE HCL 5 MG PO TABS: 5.0000 mg | ORAL_TABLET | Freq: Four times a day (QID) | ORAL | 0 refills | Status: DC | PRN

## 2022-08-13 NOTE — Telephone Encounter (Signed)
Addressed via MyChart

## 2022-08-24 ENCOUNTER — Encounter: Payer: Self-pay | Admitting: Hematology and Oncology

## 2022-08-24 NOTE — Progress Notes (Signed)
                                                                                                                                                             Patient Name: Monica [name] MRN: [id number] DOB: [birth year] Referring Physician: [name] (Profile Not Attached) Date of Service: 08/05/2022 [facility], [place]                                                        End Of Treatment Note  Diagnoses: C50.411-Malignant neoplasm of upper-outer quadrant of right female breast  Cancer Staging: Stage IIA cT3N1M0 grade 2, ER/PR positive invasive ductal carcinoma of the right breast   Intent: Curative  Radiation Treatment Dates: 06/14/2022 through 08/05/2022 Site Technique Total Dose (Gy) Dose per Fx (Gy) Completed Fx Beam Energies  Chest Wall, Right: CW_R_SCV 3D 50.4/50.4 1.8 28/28 6X  Chest Wall, Right: CW_R_Bst specialPort 10/10 2 5/5 6E   Narrative: The patient tolerated radiation therapy relatively well. She developed anticipated skin changes in the treatment field but did not have any desquamation at the conclusion of therapy.   Plan: The patient will receive a call in about one month from the radiation oncology department. She will continue follow up with Dr. Lindi Adie as well.   ________________________________________________    Carola Rhine, [facility]

## 2022-09-03 ENCOUNTER — Encounter: Payer: Self-pay | Admitting: Family Medicine

## 2022-09-03 ENCOUNTER — Telehealth (INDEPENDENT_AMBULATORY_CARE_PROVIDER_SITE_OTHER): Payer: Medicaid Other | Admitting: Family Medicine

## 2022-09-03 DIAGNOSIS — Z09 Encounter for follow-up examination after completed treatment for conditions other than malignant neoplasm: Secondary | ICD-10-CM

## 2022-09-03 DIAGNOSIS — Z3009 Encounter for other general counseling and advice on contraception: Secondary | ICD-10-CM

## 2022-09-03 NOTE — Progress Notes (Signed)
GYNECOLOGY VIRTUAL VISIT ENCOUNTER NOTE  Provider location: [facility] [place] [facility]   Patient location: Home  I connected with Monica [name] on 09/03/22 at 10:55 AM EST by MyChart Video Encounter and verified that I am speaking with the correct person using two identifiers.   I discussed the limitations, risks, security and privacy concerns of performing an evaluation and management service virtually and the availability of in person appointments. I also discussed with the patient that there may be a patient responsible charge related to this service. The patient expressed understanding and agreed to proceed.   History:  Monica [name] is a 26 y.o. No obstetric history on file. female being evaluated today for postop following lap salpingectomy for ruptured ectopic pregnancy. Pathology reviewed, confirmed. Currently in treatment for Breast CA, needs Tamoxifen. Considerations for contraception reviewed. She denies any abnormal vaginal discharge, bleeding, pelvic pain or other concerns.       Past Medical History:  Diagnosis Date   Abscess    Cancer ([place]) 10/16/2021   right breast cancer   Past Surgical History:  Procedure Laterality Date   BREAST RECONSTRUCTION WITH PLACEMENT OF TISSUE EXPANDER AND FLEX HD (ACELLULAR HYDRATED DERMIS) Bilateral 05/04/2022   Procedure: BREAST RECONSTRUCTION WITH PLACEMENT OF TISSUE EXPANDER AND FLEX HD (ACELLULAR HYDRATED DERMIS);  Surgeon: Lennice Sites, MD;  Location: [facility];  Service: Plastics;  Laterality: Bilateral;   LAPAROSCOPIC UNILATERAL SALPINGECTOMY Right 08/07/2022   Procedure: LAPAROSCOPIC RIGHT SALPINGECTOMY WITH REMOVAL OF ECTOPIC PREGNANCY;  Surgeon: Donnamae Jude, MD;  Location: [facility];  Service: Gynecology;  Laterality: Right;   MASTECTOMY W/ SENTINEL NODE BIOPSY Bilateral 05/04/2022   Procedure: BILATERAL SKIN SPARING MASTECTOMIES, RIGHT SEED LOCALIZED LYMPH NODE BIOPSY, RIGHT SENTINEL NODE BIOPSY;   Surgeon: Stark Klein, MD;  Location: [facility];  Service: General;  Laterality: Bilateral;   PORT-A-CATH REMOVAL Left 05/04/2022   Procedure: PORT REMOVAL;  Surgeon: Stark Klein, MD;  Location: [facility];  Service: General;  Laterality: Left;   PORTACATH PLACEMENT Left 10/27/2021   Procedure: INSERTION PORT-A-CATH;  Surgeon: Stark Klein, MD;  Location: [facility];  Service: General;  Laterality: Left;   The following portions of the patient's history were reviewed and updated as appropriate: allergies, current medications, past family history, past medical history, past social history, past surgical history and problem list.   Health Maintenance:  Normal pap 2 years ago with Dr. Micah Noel.    Review of Systems:  Pertinent items noted in HPI and remainder of comprehensive ROS otherwise negative.  Physical Exam:   General:  Alert, oriented and cooperative. Patient appears to be in no acute distress.  Mental Status: Normal mood and affect. Normal behavior. Normal judgment and thought content.   Respiratory: Normal respiratory effort, no problems with respiration noted  Rest of physical exam deferred due to type of encounter  Labs and Imaging No results found for this or any previous visit (from the past 336 hour(s)). NM Bone Scan Whole Body  Result Date: 08/17/2022 CLINICAL DATA:  Breast cancer, initial staging EXAM: NUCLEAR MEDICINE WHOLE BODY BONE SCAN TECHNIQUE: Whole body anterior and posterior images were obtained approximately 3 hours after intravenous injection of radiopharmaceutical. RADIOPHARMACEUTICALS:  20.0 mCi Technetium-47mMDP IV COMPARISON:  11/06/2021 FINDINGS: Normal radiotracer uptake without focal suspicious activity to suggest osseous metastatic disease. Injection site in the right antecubital fossa. Expected soft tissue and urinary tract uptake and excretion. No significant degenerative change. IMPRESSION: No scintigraphic evidence of osseous metastatic disease. Electronically  Signed    By: Delanna Ahmadi M.D.   On: 08/17/2022 15:39   US OB LESS THAN 14 WEEKS WITH OB TRANSVAGINAL  Result Date: 08/07/2022 CLINICAL DATA:  Pelvis, low back pain, vaginal spotting today. EXAM: OBSTETRIC <14 WK [place] AND TRANSVAGINAL OB US TECHNIQUE: Both transabdominal and transvaginal ultrasound examinations were performed for complete evaluation of the gestation as well as the maternal uterus, adnexal regions, and pelvic cul-de-sac. Transvaginal technique was performed to assess early pregnancy. COMPARISON:  None Available. FINDINGS: Intrauterine gestational sac: None Yolk sac:  Not Visualized. Embryo:  Not Visualized. Subchorionic hemorrhage:  None visualized. Maternal uterus/adnexae: Hyperechoic ring-like structure within the RIGHT adnexal region, separate from the RIGHT ovary, compatible with ectopic pregnancy, suspected tubal location. Moderate amount of free fluid in the RIGHT adnexal region and cul-de-sac, mostly complex and compatible with blood products indicating ruptured ectopic. Maternal RIGHT ovary, separate from the presumed ectopic pregnancy, appears normal. Maternal LEFT ovary is unremarkable. No mass is seen in the LEFT adnexal region. IMPRESSION: 1. Ectopic pregnancy within the RIGHT adnexal region, suspected tubal location. 2. Moderate amount of free fluid in the RIGHT adnexal region and cul-de-sac, mostly complex and compatible with blood products indicating ruptured ectopic pregnancy. 3. No intrauterine pregnancy. Critical Value/emergent results were called by telephone at the time of interpretation on 08/07/2022 at 12:05 pm to provider [facility] , who verbally acknowledged these results. Electronically Signed   By: Franki Cabot M.D.   On: 08/07/2022 12:09       Assessment and Plan:     Postop check - Healing well  Encounter for counseling regarding contraception - Given Breast Ca, could have CuIUD or Phexxi, or permanent sterilization-->she will consider options.       I discussed  the assessment and treatment plan with the patient. The patient was provided an opportunity to ask questions and all were answered. The patient agreed with the plan and demonstrated an understanding of the instructions.   The patient was advised to call back or seek an in-person evaluation/go to the ED if the symptoms worsen or if the condition fails to improve as anticipated.  I provided 11 minutes of face-to-face time during this encounter.   Donnamae Jude, MD [facility], [place]

## 2022-09-03 NOTE — Progress Notes (Signed)
Virtual Visit via Video Note  I connected with Monica [name] on 09/03/22 at 10:55 AM EST by a video enabled telemedicine application and verified that I am speaking with the correct person using two identifiers  Monica [name], CMA

## 2022-09-15 ENCOUNTER — Encounter: Payer: Self-pay | Admitting: Hematology and Oncology

## 2022-09-16 ENCOUNTER — Other Ambulatory Visit: Payer: Self-pay

## 2022-09-16 ENCOUNTER — Inpatient Hospital Stay: Payer: Medicaid Other | Attending: Hematology and Oncology | Admitting: Hematology and Oncology

## 2022-09-16 VITALS — BP 116/70 | HR 80 | Temp 97.3°F | Resp 18 | Ht 65.98 in | Wt 129.8 lb

## 2022-09-16 DIAGNOSIS — C50411 Malignant neoplasm of upper-outer quadrant of right female breast: Secondary | ICD-10-CM | POA: Diagnosis not present

## 2022-09-16 DIAGNOSIS — L02411 Cutaneous abscess of right axilla: Secondary | ICD-10-CM | POA: Diagnosis present

## 2022-09-16 DIAGNOSIS — Z170 Estrogen receptor positive status [ER+]: Secondary | ICD-10-CM | POA: Insufficient documentation

## 2022-09-16 DIAGNOSIS — Z923 Personal history of irradiation: Secondary | ICD-10-CM | POA: Diagnosis not present

## 2022-09-16 DIAGNOSIS — Z9221 Personal history of antineoplastic chemotherapy: Secondary | ICD-10-CM | POA: Diagnosis not present

## 2022-09-16 DIAGNOSIS — Z9013 Acquired absence of bilateral breasts and nipples: Secondary | ICD-10-CM | POA: Insufficient documentation

## 2022-09-16 DIAGNOSIS — Z79810 Long term (current) use of selective estrogen receptor modulators (SERMs): Secondary | ICD-10-CM | POA: Insufficient documentation

## 2022-09-16 MED ORDER — CEPHALEXIN 500 MG PO CAPS: 500.0000 mg | ORAL_CAPSULE | Freq: Two times a day (BID) | ORAL | 0 refills | Status: DC

## 2022-09-16 NOTE — Progress Notes (Signed)
Patient Care Team: Pcp, No as PCP - General [name], MD as Consulting Physician (Hematology and Oncology) Kyung Rudd, MD as Consulting Physician (Radiation Oncology) Stark Klein, MD as Consulting Physician (General Surgery)  DIAGNOSIS:  Encounter Diagnosis  Name Primary?   Malignant neoplasm of upper-outer quadrant of right breast in female, estrogen receptor positive ([place]) Yes    SUMMARY OF ONCOLOGIC HISTORY: Oncology History  Malignant neoplasm of upper-outer quadrant of right breast in female, estrogen receptor positive ([place])  10/16/2021 Initial Diagnosis   Malignant neoplasm of upper-outer quadrant of right breast in female, estrogen receptor positive ([place])   10/16/2021 Cancer Staging   Staging form: Breast, AJCC 8th Edition - Clinical stage from 10/16/2021: Stage IIA (cT3, cN1, cM0, G2, ER+, PR+, HER2: Equivocal) - Signed by [name], MD on 10/16/2021 Histologic grading system: 3 grade system   10/28/2021 - 03/11/2022 Chemotherapy   Patient is on Treatment Plan : BREAST ADJUVANT DOSE DENSE AC q14d / PACLitaxel q7d      Genetic Testing   [facility] CancerNext-Expanded identified 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 Report date is 01/21/2022.  The CancerNext-Expanded gene panel offered by [facility] and includes sequencing, rearrangement, and RNA analysis for the following 77 genes: AIP, ALK, APC, ATM, AXIN2, BAP1, BARD1, BLM, BMPR1A, BRCA1, BRCA2, BRIP1, CDC73, CDH1, CDK4, CDKN1B, CDKN2A, CHEK2, CTNNA1, DICER1, FANCC, FH, FLCN, GALNT12, KIF1B, LZTR1, MAX, MEN1, MET, MLH1, MSH2, MSH3, MSH6, MUTYH, NBN, NF1, NF2, NTHL1, PALB2, PHOX2B, PMS2, POT1, PRKAR1A, PTCH1, PTEN, RAD51C, RAD51D, RB1, RECQL, RET, SDHA, SDHAF2, SDHB, SDHC, SDHD, SMAD4, SMARCA4, SMARCB1, SMARCE1, STK11, SUFU, TMEM127, TP53, TSC1, TSC2, VHL and XRCC2 (sequencing and deletion/duplication); EGFR, EGLN1, HOXB13, KIT, MITF, PDGFRA, POLD1, and POLE (sequencing only); EPCAM and GREM1  (deletion/duplication only).      CHIEF COMPLIANT: Lump under axilla  INTERVAL HISTORY: Monica [name] is a 26 y.o. female with right breast cancer. She presents to the clinic today for lump under her right arm. She states that it hurts really bad. She says this has been going on for about 2 weeks.    ALLERGIES:  has No Known Allergies.  MEDICATIONS:  Current Outpatient Medications  Medication Sig Dispense Refill   acetaminophen (TYLENOL) 500 MG tablet Take 1,000 mg by mouth daily as needed for headache or mild pain.     cyclobenzaprine (FLEXERIL) 5 MG tablet Take 1 tablet (5 mg total) by mouth 3 (three) times daily as needed for muscle spasms. (Patient not taking: Reported on 09/03/2022) 60 tablet 1   gabapentin (NEURONTIN) 100 MG capsule Take 2 capsules (200 mg total) by mouth 2 (two) times daily. (Patient not taking: Reported on 09/03/2022) 120 capsule 1   oxyCODONE (OXY IR/ROXICODONE) 5 MG immediate release tablet Take 1 tablet (5 mg total) by mouth every 6 (six) hours as needed for severe pain. (Patient not taking: Reported on 09/03/2022) 12 tablet 0   tamoxifen (NOLVADEX) 20 MG tablet Take 1 tablet (20 mg total) by mouth daily. (Patient not taking: Reported on 09/03/2022) 90 tablet 3   No current facility-administered medications for this visit.    PHYSICAL EXAMINATION: ECOG PERFORMANCE STATUS: 1 - Symptomatic but completely ambulatory  Vitals:   09/16/22 1522  BP: 116/70  Pulse: 80  Resp: 18  Temp: (!) 97.3 F (36.3 C)  SpO2: 100%   Filed Weights   09/16/22 1522  Weight: 129 lb 12.8 oz (58.9 kg)    BREAST: Right axillary swelling with nodule that upon pressing expressed purulent material  LABORATORY DATA:  I have reviewed the data as listed    Latest Ref Rng & Units 08/07/2022    9:00 AM 03/18/2022    9:26 AM 03/11/2022    8:35 AM  CMP  Glucose 70 - 99 mg/dL 122  110  104   BUN 6 - 20 mg/dL _0 Creatinine 0.44 - 1.00 mg/dL 0.62  0.59  0.64   Sodium  135 - 145 mmol/L 140  139  140   Potassium 3.5 - 5.1 mmol/L 3.1  3.9  3.9   Chloride 98 - 111 mmol/L 103  106  106   CO2 22 - 32 mmol/L _1 Calcium 8.9 - 10.3 mg/dL 9.8  9.9  9.7   Total Protein 6.5 - 8.1 g/dL 6.8  6.8  6.8   Total Bilirubin 0.3 - 1.2 mg/dL 0.4  0.6  0.4   Alkaline Phos 38 - 126 U/L 43  31  32   AST 15 - 41 U/L _2 ALT 0 - 44 U/L _3 Lab Results  Component Value Date   WBC 3.7 (L) 08/07/2022   HGB 12.4 08/07/2022   HCT 35.3 (L) 08/07/2022   MCV 89.1 08/07/2022   PLT 211 08/07/2022   NEUTROABS 4.6 03/18/2022    ASSESSMENT & PLAN:  Malignant neoplasm of upper-outer quadrant of right breast in female, estrogen receptor positive ([place]) 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  Neoadjuvant chemotherapy with dose dense Adriamycin and Cytoxan followed by Taxol weekly x9 stopped 03/11/2022 2. bilateral mastectomies 05/04/2022: Left breast: Benign Right mastectomy: 9 cm tumor with 5% residual tumor margins negative, 0/8 lymph nodes negative   3.  Adjuvant radiation: 06/30/2022-08/05/2022 4.  Followed by adjuvant antiestrogen therapy (ovarian suppression) along with CDK 4 and 6 inhibitor 5.  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  Patient decided to take tamoxifen instead.   Bone  scan 11/06/21 Bone scan: No mets MRI guided biopsies 10/29/21: Left breast bx: Benign    Chemo-induced peripheral neuropathy: Mild Tamoxifen toxicities: 08/07/2022: Ectopic ruptured pregnancy.  Abscess right axilla: We were able to squeeze purulent material out of the palpable nodule.  I sent a prescription for antibiotics.  If it does not improve in a week she will see her surgeon back again.  Tamoxifen toxicities: She started tamoxifen on 09/13/2022.  So far she is tolerating it extremely well.  She has an appointment for survivorship care plan visit in January.    No orders of the defined types were placed in this encounter.  The patient has a good understanding of the overall plan. she agrees with it. she will call with any problems that may develop before the next visit here. Total time spent: 30 mins including face to face time and time spent for planning, charting and co-ordination of care   Harriette Ohara,  MD 09/16/22    I Gardiner Coins am scribing for Dr. Lindi Adie  I have reviewed the above documentation for accuracy and completeness, and I agree with the above.

## 2022-09-16 NOTE — Assessment & Plan Note (Addendum)
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  Neoadjuvant chemotherapy with dose dense Adriamycin and Cytoxan followed by Taxol weekly x9 stopped 03/11/2022 2. bilateral mastectomies 05/04/2022: Left breast: Benign Right mastectomy: 9 cm tumor with 5% residual tumor margins negative, 0/8 lymph nodes negative   3.  Adjuvant radiation: 06/30/2022-08/05/2022 4.  Followed by adjuvant antiestrogen therapy (ovarian suppression) along with CDK 4 and 6 inhibitor 5.  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  Patient decided to take tamoxifen instead.   Bone scan 11/06/21 Bone scan: No mets MRI guided biopsies 10/29/21: Left breast bx: Benign    Chemo-induced peripheral neuropathy: Mild Tamoxifen toxicities: 08/07/2022: Ectopic ruptured pregnancy.  Abscess right axilla: We were able to squeeze purulent material out of the palpable nodule.  I sent a prescription for antibiotics.  If it does not improve in a week she will see her surgeon back again.  Tamoxifen toxicities: She started tamoxifen on 09/13/2022.  So far she is tolerating it extremely well.  She has an appointment for survivorship care plan visit in January.

## 2022-09-20 ENCOUNTER — Ambulatory Visit
Admission: RE | Admit: 2022-09-20 | Discharge: 2022-09-20 | Disposition: A | Discharge status: Home / Self Care | Payer: Medicaid Other | Source: Ambulatory Visit | Attending: Radiation Oncology | Admitting: Radiation Oncology

## 2022-09-20 DIAGNOSIS — Z510 Encounter for antineoplastic radiation therapy: Secondary | ICD-10-CM | POA: Insufficient documentation

## 2022-09-20 DIAGNOSIS — C50411 Malignant neoplasm of upper-outer quadrant of right female breast: Secondary | ICD-10-CM | POA: Insufficient documentation

## 2022-09-20 DIAGNOSIS — Z170 Estrogen receptor positive status [ER+]: Secondary | ICD-10-CM | POA: Insufficient documentation

## 2022-09-20 NOTE — Progress Notes (Signed)
  Radiation Oncology         [phone] ________________________________  Name: Monica [name] MRN: [id number]  Date of Service: 09/20/2022  DOB: [birth year]  Post Treatment Telephone Note  Diagnosis:  Stage IIA cT3N1M0 grade 2, ER/PR positive invasive ductal carcinoma of the right breast   Intent: Curative  Radiation Treatment Dates: 06/14/2022 through 08/05/2022 Site Technique Total Dose (Gy) Dose per Fx (Gy) Completed Fx Beam Energies  Chest Wall, Right: CW_R_SCV 3D 50.4/50.4 1.8 28/28 6X  Chest Wall, Right: CW_R_Bst specialPort 10/10 2 5/5 6E  (as documented in provider EOT note)   The patient was available for call today.   Symptoms of fatigue have improved since completing therapy.  Symptoms of skin changes have improved since completing therapy.  The patient was encouraged to avoid sun exposure in the area of prior treatment for up to one year following radiation with either sunscreen or by the style of clothing worn in the sun.  The patient has scheduled follow up with her medical oncologist Dr. Lindi Adie for ongoing surveillance, and was encouraged to call if she develops concerns or questions regarding radiation.  This concludes the interview.   Monica Kern, LPN

## 2022-09-24 ENCOUNTER — Ambulatory Visit: Payer: Medicaid Other | Admitting: Surgical

## 2022-09-24 ENCOUNTER — Encounter: Payer: Self-pay | Admitting: Surgical

## 2022-09-24 DIAGNOSIS — Z923 Personal history of irradiation: Secondary | ICD-10-CM

## 2022-09-24 DIAGNOSIS — Z9889 Other specified postprocedural states: Secondary | ICD-10-CM

## 2022-09-24 DIAGNOSIS — Z9013 Acquired absence of bilateral breasts and nipples: Secondary | ICD-10-CM | POA: Diagnosis not present

## 2022-09-24 DIAGNOSIS — C50911 Malignant neoplasm of unspecified site of right female breast: Secondary | ICD-10-CM

## 2022-09-24 NOTE — Progress Notes (Signed)
   Referring Provider No referring provider defined for this encounter.   CC: No chief complaint on file.     Monica [name] is an 26 y.o. female.  HPI: Patient is a 26 year old female here for follow-up on her bilateral breast reconstruction.  She had bilateral skin sparing mastectomies with Dr. Barry Dienes on 05/04/2022 for followed immediately by placement of bilateral breast tissue expanders and ADM with Dr. Erin Hearing.  She had neoadjuvant chemotherapy as well as adjuvant radiation from 06/30/2022 until 08/05/2022.  She did have genetic testing which was BRCA2 positive.  She underwent laparoscopic right salpingectomy with removal of ectopic pregnancy on 08/07/2022.  She currently has 400 cc out of 600 cc in her tissue expanders.  She reports she is doing well, she is happy with the current size of her expanders.  She reports that she has been having some shooting shocklike pains in bilateral breasts, seems consistent with nerve regeneration.  She did have an infected hair follicle and has been evaluated by oncology for this and placed on Keflex.  She reports that is getting better.  Review of Systems General: No fevers or chills  Physical Exam    09/16/2022    3:22 PM 08/07/2022    5:25 PM 08/07/2022    5:10 PM  Vitals with BMI  Height 5' 5.98"    Weight 129 lbs 13 oz    BMI 10.07    Systolic 121 975 883  Diastolic 70 81 79  Pulse 80 75 85    General:  No acute distress,  Alert and oriented, Non-Toxic, Normal speech and affect Breast: Bilateral breast incisions are intact, healing well.  She overall has good symmetry, minimal radiation skin changes noted.  Bilateral breast/expanders are soft.  No erythema or cellulitic changes noted.  No subcutaneous fluid collection noted.  Assessment/Plan 26 year old female status post bilateral mastectomy, currently has tissue expanders in place.  400/600 cc bilaterally.  We discussed that Dr. Erin Hearing is no longer at this office and has moved out of  state.  Offered appointment with another surgeon, patient was agreeable to this.  We will have her see Dr. Lovena Le for consultation to discuss plan for exchanging expanders for implants.  We discussed that typically after radiation we like to wait a certain period of time, will have patient discuss plan with Dr. Lovena Le.  We will plan to see her back in 1 month, she knows to call with questions or concerns in the meantime.  Pictures were obtained of the patient and placed in the chart with the patient's or guardian's permission.   Carola Rhine Tola Meas 09/24/2022, 9:02 AM

## 2022-10-27 ENCOUNTER — Encounter: Payer: Self-pay | Admitting: Plastic Surgery

## 2022-10-27 ENCOUNTER — Ambulatory Visit: Payer: Medicaid Other | Admitting: Plastic Surgery

## 2022-10-27 DIAGNOSIS — N644 Mastodynia: Secondary | ICD-10-CM | POA: Diagnosis not present

## 2022-10-27 DIAGNOSIS — C50411 Malignant neoplasm of upper-outer quadrant of right female breast: Secondary | ICD-10-CM | POA: Diagnosis not present

## 2022-10-27 DIAGNOSIS — Z9889 Other specified postprocedural states: Secondary | ICD-10-CM

## 2022-10-27 DIAGNOSIS — Z923 Personal history of irradiation: Secondary | ICD-10-CM | POA: Diagnosis not present

## 2022-10-27 DIAGNOSIS — Z170 Estrogen receptor positive status [ER+]: Secondary | ICD-10-CM

## 2022-10-27 DIAGNOSIS — Z9013 Acquired absence of bilateral breasts and nipples: Secondary | ICD-10-CM

## 2022-10-27 NOTE — Progress Notes (Signed)
Referring Provider No referring provider defined for this encounter.   CC:  Chief Complaint  Patient presents with   Follow-up      Monica [name] is an 27 y.o. female.  HPI: Monica [name] returns today for evaluation for a tissue expander to implant replacement.  Patient had bilateral mastectomies and immediate reconstruction with tissue expanders in July 2023.  She had 400 mL of saline placed in her 600 cc at the time of surgery she has not had any additional expansion since that time.  She has undergone radiation therapy she is happy with the volume of her breast but feels they are too wide for her body shape.  She would like to have narrower implants placed.  She does note persistent pain in the right breast which extends into her arm, the breast that received the radiation therapy. She denies any shortness of breath chest pain or difficulty with daily activities.  No Known Allergies  Outpatient Encounter Medications as of 10/27/2022  Medication Sig   acetaminophen (TYLENOL) 500 MG tablet Take 1,000 mg by mouth daily as needed for headache or mild pain.   cephALEXin (KEFLEX) 500 MG capsule Take 1 capsule (500 mg total) by mouth 2 (two) times daily.   cyclobenzaprine (FLEXERIL) 5 MG tablet Take 1 tablet (5 mg total) by mouth 3 (three) times daily as needed for muscle spasms.   gabapentin (NEURONTIN) 100 MG capsule Take 2 capsules (200 mg total) by mouth 2 (two) times daily.   oxyCODONE (OXY IR/ROXICODONE) 5 MG immediate release tablet Take 1 tablet (5 mg total) by mouth every 6 (six) hours as needed for severe pain.   tamoxifen (NOLVADEX) 20 MG tablet Take 1 tablet (20 mg total) by mouth daily.   [DISCONTINUED] prochlorperazine (COMPAZINE) 10 MG tablet Take 1 tablet (10 mg total) by mouth every 6 (six) hours as needed (Nausea or vomiting). (Patient not taking: Reported on 03/04/2022)   No facility-administered encounter medications on file as of 10/27/2022.     Past Medical History:   Diagnosis Date   Abscess    Cancer ([place]) 10/16/2021   right breast cancer    Past Surgical History:  Procedure Laterality Date   BREAST RECONSTRUCTION WITH PLACEMENT OF TISSUE EXPANDER AND FLEX HD (ACELLULAR HYDRATED DERMIS) Bilateral 05/04/2022   Procedure: BREAST RECONSTRUCTION WITH PLACEMENT OF TISSUE EXPANDER AND FLEX HD (ACELLULAR HYDRATED DERMIS);  Surgeon: Lennice Sites, MD;  Location: [facility];  Service: Plastics;  Laterality: Bilateral;   LAPAROSCOPIC UNILATERAL SALPINGECTOMY Right 08/07/2022   Procedure: LAPAROSCOPIC RIGHT SALPINGECTOMY WITH REMOVAL OF ECTOPIC PREGNANCY;  Surgeon: Donnamae Jude, MD;  Location: [facility];  Service: Gynecology;  Laterality: Right;   MASTECTOMY W/ SENTINEL NODE BIOPSY Bilateral 05/04/2022   Procedure: BILATERAL SKIN SPARING MASTECTOMIES, RIGHT SEED LOCALIZED LYMPH NODE BIOPSY, RIGHT SENTINEL NODE BIOPSY;  Surgeon: Stark Klein, MD;  Location: [facility];  Service: General;  Laterality: Bilateral;   PORT-A-CATH REMOVAL Left 05/04/2022   Procedure: PORT REMOVAL;  Surgeon: Stark Klein, MD;  Location: [place] [facility];  Service: General;  Laterality: Left;   PORTACATH PLACEMENT Left 10/27/2021   Procedure: INSERTION PORT-A-CATH;  Surgeon: Stark Klein, MD;  Location: [facility];  Service: General;  Laterality: Left;    Family History  Problem Relation Age of Onset   Breast cancer Maternal Grandmother 80    Social History   Social History Narrative   Not on file     Review of Systems General: Denies fevers, chills, weight loss CV: Denies chest  pain, shortness of breath, palpitations Breast: No complaints other than unhappy with the width of the breast.  Physical Exam    09/16/2022    3:22 PM 08/07/2022    5:25 PM 08/07/2022    5:10 PM  Vitals with BMI  Height 5' 5.98"    Weight 129 lbs 13 oz    BMI 40.81    Systolic 448 185 631  Diastolic 70 81 79  Pulse 80 75 85    General:  No acute distress,  Alert and oriented, Non-Toxic, Normal speech and  affect Breast: Breasts are surgically absent.  There are palpable tissue expanders in both breast.  The skin on the right breast is consistent with radiation damage.  All incisions are well-healed. Mammogram: Not applicable Assessment/Plan Status post breast reconstruction with tissue expanders: Patient is requesting removal tissue expanders and placement of permanent silicone implants.  She she is already extensively discussed the use of silicone implants with Dr. Erin Hearing.  She is still interested in use of silicone implants for her reconstruction. We discussed at length the risks of operations after radiation therapy including wound separation and wound nonhealing.  She understands also that there is often difficulty with achieving symmetry between the radiated nonradiated breast. I would like to wait several months until we proceed until she is 5 to 6 months out from her radiation therapy this will put her around April.  I have also told her that I will him to modify the pocket to give her a narrower implant however this may also be challenging on the radiation side.  She understands and request that I proceed.    Monica [name] 10/27/2022, 10:30 AM

## 2022-11-05 ENCOUNTER — Inpatient Hospital Stay: Payer: Medicaid Other | Attending: Hematology and Oncology | Admitting: Adult Health

## 2022-11-05 ENCOUNTER — Encounter: Payer: Self-pay | Admitting: *Deleted

## 2022-11-05 NOTE — Progress Notes (Signed)
Mailed SCP summary,survivorship certificate, monthly calendar and nutrition rainbow to patient.

## 2022-11-10 ENCOUNTER — Telehealth: Payer: Self-pay | Admitting: Adult Health

## 2022-11-10 NOTE — Telephone Encounter (Signed)
Per 1/24 ib, msg left

## 2022-11-18 ENCOUNTER — Encounter: Payer: Self-pay | Admitting: Adult Health

## 2022-11-18 ENCOUNTER — Inpatient Hospital Stay: Payer: Medicaid Other | Attending: Hematology and Oncology | Admitting: Adult Health

## 2022-11-18 VITALS — BP 113/79 | HR 78 | Temp 99.0°F | Resp 16 | Ht 65.0 in | Wt 123.0 lb

## 2022-11-18 DIAGNOSIS — C50411 Malignant neoplasm of upper-outer quadrant of right female breast: Secondary | ICD-10-CM | POA: Insufficient documentation

## 2022-11-18 DIAGNOSIS — Z79810 Long term (current) use of selective estrogen receptor modulators (SERMs): Secondary | ICD-10-CM | POA: Insufficient documentation

## 2022-11-18 DIAGNOSIS — Z9013 Acquired absence of bilateral breasts and nipples: Secondary | ICD-10-CM | POA: Insufficient documentation

## 2022-11-18 DIAGNOSIS — Z170 Estrogen receptor positive status [ER+]: Secondary | ICD-10-CM

## 2022-11-18 DIAGNOSIS — Z79899 Other long term (current) drug therapy: Secondary | ICD-10-CM | POA: Insufficient documentation

## 2022-11-18 DIAGNOSIS — Z923 Personal history of irradiation: Secondary | ICD-10-CM | POA: Diagnosis not present

## 2022-11-18 DIAGNOSIS — Z1501 Genetic susceptibility to malignant neoplasm of breast: Secondary | ICD-10-CM | POA: Diagnosis not present

## 2022-11-18 DIAGNOSIS — Z1509 Genetic susceptibility to other malignant neoplasm: Secondary | ICD-10-CM | POA: Diagnosis not present

## 2022-11-18 DIAGNOSIS — Z87891 Personal history of nicotine dependence: Secondary | ICD-10-CM | POA: Diagnosis not present

## 2022-11-18 NOTE — Progress Notes (Signed)
SURVIVORSHIP VISIT:    BRIEF ONCOLOGIC HISTORY:  Oncology History  Malignant neoplasm of upper-outer quadrant of right breast in female, estrogen receptor positive ([place])  10/16/2021 Initial Diagnosis   Malignant neoplasm of upper-outer quadrant of right breast in female, estrogen receptor positive ([place])   10/16/2021 Cancer Staging   Staging form: Breast, AJCC 8th Edition - Clinical stage from 10/16/2021: Stage IIA (cT3, cN1, cM0, G2, ER+, PR+, HER2: Equivocal) - Signed by [name], MD on 10/16/2021 Histologic grading system: 3 grade system   10/28/2021 - 03/11/2022 Chemotherapy   Patient is on Treatment Plan : BREAST ADJUVANT DOSE DENSE AC q14d / PACLitaxel q7d      Genetic Testing   [facility] CancerNext-Expanded identified 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 Report date is 01/21/2022.  The CancerNext-Expanded gene panel offered by [facility] and includes sequencing, rearrangement, and RNA analysis for the following 77 genes: AIP, ALK, APC, ATM, AXIN2, BAP1, BARD1, BLM, BMPR1A, BRCA1, BRCA2, BRIP1, CDC73, CDH1, CDK4, CDKN1B, CDKN2A, CHEK2, CTNNA1, DICER1, FANCC, FH, FLCN, GALNT12, KIF1B, LZTR1, MAX, MEN1, MET, MLH1, MSH2, MSH3, MSH6, MUTYH, NBN, NF1, NF2, NTHL1, PALB2, PHOX2B, PMS2, POT1, PRKAR1A, PTCH1, PTEN, RAD51C, RAD51D, RB1, RECQL, RET, SDHA, SDHAF2, SDHB, SDHC, SDHD, SMAD4, SMARCA4, SMARCB1, SMARCE1, STK11, SUFU, TMEM127, TP53, TSC1, TSC2, VHL and XRCC2 (sequencing and deletion/duplication); EGFR, EGLN1, HOXB13, KIT, MITF, PDGFRA, POLD1, and POLE (sequencing only); EPCAM and GREM1 (deletion/duplication only).    05/04/2022 -  Anti-estrogen oral therapy   Tamoxifen   06/14/2022 - 08/05/2022 Radiation Therapy   Site Technique Total Dose (Gy) Dose per Fx (Gy) Completed Fx Beam Energies  Chest Wall, Right: CW_R_SCV 3D 50.4/50.4 1.8 28/28 6X  Chest Wall, Right: CW_R_Bst specialPort 10/10 2 5/5 6E       INTERVAL HISTORY:  Monica [name] to review her survivorship  care plan detailing her treatment course for breast cancer, as well as monitoring long-term side effects of that treatment, education regarding health maintenance, screening, and overall wellness and health promotion.     Overall, Monica [name] reports feeling quite well.  She is taking tamoxifen daily with good tolerance.  She denies hot flashes, vaginal dishcarge. She has a previous ER visit due to ruptured ectopic pregnancy.  She has recovered well since that time.  She tells me that she is using birth control with her significant other.  She denies any other issues today other than some weight loss. Her most recent bone scan in 07/2022 and was negative for metastatic disease.    REVIEW OF SYSTEMS:  Review of Systems  Constitutional:  Negative for appetite change, chills, fatigue, fever and unexpected weight change.  HENT:   Negative for hearing loss, lump/mass and trouble swallowing.   Eyes:  Negative for eye problems and icterus.  Respiratory:  Negative for chest tightness, cough and shortness of breath.   Cardiovascular:  Negative for chest pain, leg swelling and palpitations.  Gastrointestinal:  Negative for abdominal distention, abdominal pain, constipation, diarrhea, nausea and vomiting.  Endocrine: Negative for hot flashes.  Genitourinary:  Negative for difficulty urinating.   Musculoskeletal:  Negative for arthralgias.  Skin:  Negative for itching and rash.  Neurological:  Negative for dizziness, extremity weakness, headaches and numbness.  Hematological:  Negative for adenopathy. Does not bruise/bleed easily.  Psychiatric/Behavioral:  Negative for depression. The patient is not nervous/anxious.    Breast: Denies any new nodularity, masses, tenderness, nipple changes, or nipple discharge.     PAST MEDICAL/SURGICAL HISTORY:  Past Medical History:  Diagnosis Date   Abscess    Cancer ([place]) 10/16/2021   right breast cancer   Past Surgical History:  Procedure Laterality Date    BREAST RECONSTRUCTION WITH PLACEMENT OF TISSUE EXPANDER AND FLEX HD (ACELLULAR HYDRATED DERMIS) Bilateral 05/04/2022   Procedure: BREAST RECONSTRUCTION WITH PLACEMENT OF TISSUE EXPANDER AND FLEX HD (ACELLULAR HYDRATED DERMIS);  Surgeon: Lennice Sites, MD;  Location: [facility];  Service: Plastics;  Laterality: Bilateral;   LAPAROSCOPIC UNILATERAL SALPINGECTOMY Right 08/07/2022   Procedure: LAPAROSCOPIC RIGHT SALPINGECTOMY WITH REMOVAL OF ECTOPIC PREGNANCY;  Surgeon: Donnamae Jude, MD;  Location: [facility];  Service: Gynecology;  Laterality: Right;   MASTECTOMY W/ SENTINEL NODE BIOPSY Bilateral 05/04/2022   Procedure: BILATERAL SKIN SPARING MASTECTOMIES, RIGHT SEED LOCALIZED LYMPH NODE BIOPSY, RIGHT SENTINEL NODE BIOPSY;  Surgeon: Stark Klein, MD;  Location: [facility];  Service: General;  Laterality: Bilateral;   PORT-A-CATH REMOVAL Left 05/04/2022   Procedure: PORT REMOVAL;  Surgeon: Stark Klein, MD;  Location: [facility];  Service: General;  Laterality: Left;   PORTACATH PLACEMENT Left 10/27/2021   Procedure: INSERTION PORT-A-CATH;  Surgeon: Stark Klein, MD;  Location: [facility];  Service: General;  Laterality: Left;     ALLERGIES:  No Known Allergies   CURRENT MEDICATIONS:  Outpatient Encounter Medications as of 11/18/2022  Medication Sig   tamoxifen (NOLVADEX) 20 MG tablet Take 1 tablet (20 mg total) by mouth daily.   acetaminophen (TYLENOL) 500 MG tablet Take 1,000 mg by mouth daily as needed for headache or mild pain. (Patient not taking: Reported on 11/18/2022)   cephALEXin (KEFLEX) 500 MG capsule Take 1 capsule (500 mg total) by mouth 2 (two) times daily. (Patient not taking: Reported on 11/18/2022)   cyclobenzaprine (FLEXERIL) 5 MG tablet Take 1 tablet (5 mg total) by mouth 3 (three) times daily as needed for muscle spasms. (Patient not taking: Reported on 11/18/2022)   gabapentin (NEURONTIN) 100 MG capsule Take 2 capsules (200 mg total) by mouth 2 (two) times daily. (Patient not taking: Reported on 11/18/2022)    oxyCODONE (OXY IR/ROXICODONE) 5 MG immediate release tablet Take 1 tablet (5 mg total) by mouth every 6 (six) hours as needed for severe pain. (Patient not taking: Reported on 11/18/2022)   [DISCONTINUED] prochlorperazine (COMPAZINE) 10 MG tablet Take 1 tablet (10 mg total) by mouth every 6 (six) hours as needed (Nausea or vomiting). (Patient not taking: Reported on 03/04/2022)   No facility-administered encounter medications on file as of 11/18/2022.     ONCOLOGIC FAMILY HISTORY:  Family History  Problem Relation Age of Onset   Breast cancer Maternal Grandmother 6    SOCIAL HISTORY:  Social History   Socioeconomic History   Marital status: Married    Spouse name: Not on file   Number of children: Not on file   Years of education: Not on file   Highest education level: Not on file  Occupational History   Not on file  Tobacco Use   Smoking status: Never   Smokeless tobacco: Never  Vaping Use   Vaping Use: Former   Quit date: 07/18/2021  Substance and Sexual Activity   Alcohol use: No   Drug use: Never   Sexual activity: Yes    Birth control/protection: Other-see comments    Comment: Depo Shot  Other Topics Concern   Not on file  Social History Narrative   Not on file   Social Determinants of Health   Financial Resource Strain: Not on file  Food Insecurity: Not on file  Transportation Needs: Not on file  Physical Activity: Not on file  Stress: Not on file  Social Connections: Not on file  Intimate Partner Violence: Not on file     OBSERVATIONS/OBJECTIVE:  BP 113/79 (BP Location: Left Arm, Patient Position: Sitting)   Pulse 78   Temp 99 F (37.2 C) (Temporal)   Resp 16   Ht '5\' 5"'$  (1.651 m)   Wt 123 lb (55.8 kg)   SpO2 100%   BMI 20.47 kg/m  GENERAL: Patient is a well appearing female in no acute distress HEENT:  Sclerae anicteric.  Oropharynx clear and moist. No ulcerations or evidence of oropharyngeal candidiasis. Neck is supple.  NODES:  No cervical,  supraclavicular, or axillary lymphadenopathy palpated.  BREAST EXAM:  s/p bilateral mastectomy and reconstruction with no sign of recurrence.  LUNGS:  Clear to auscultation bilaterally.  No wheezes or rhonchi. HEART:  Regular rate and rhythm. No murmur appreciated. ABDOMEN:  Soft, nontender.  Positive, normoactive bowel sounds. No organomegaly palpated. MSK:  No focal spinal tenderness to palpation. Full range of motion bilaterally in the upper extremities. EXTREMITIES:  No peripheral edema.   SKIN:  Clear with no obvious rashes or skin changes. No nail dyscrasia. NEURO:  Nonfocal. Well oriented.  Appropriate affect.   LABORATORY DATA:  None for this visit.  DIAGNOSTIC IMAGING:  None for this visit.      ASSESSMENT AND PLAN:  Ms.. [name] is a pleasant 27 y.o. female with Stage IIA right breast invasive ductal carcinoma, ER+/PR+/HER2-, diagnosed in 09/2021, treated with lumpectomy, adjuvant radiation therapy, and anti-estrogen therapy with Tamoxifen beginning in 04/2022.  She presents to the Survivorship Clinic for our initial meeting and routine follow-up post-completion of treatment for breast cancer.    1. Stage IIA right breast cancer:  Ms. Beckstrom is continuing to recover from definitive treatment for breast cancer. She will follow-up with her medical oncologist, Dr. Lindi Adie in 6 months with history and physical exam per surveillance protocol.  She will continue her anti-estrogen therapy with Tamoxifen which she is tolerating well. I reivewed with her that she can still get pregnant on Tamoxifen and that she should use birth control such as condoms or talk to GYN about getting an IUD.  She verbalized understanding and let me know that she and her partner are using condoms.  We also discussed Signatera testing and will order this today. If she continues to lose further weight she will reach out and let us know--we discussed healthy diet and eating regularly. Today, a comprehensive  survivorship care plan and treatment summary was reviewed with the patient today detailing her breast cancer diagnosis, treatment course, potential late/long-term effects of treatment, appropriate follow-up care with recommendations for the future, and patient education resources.  A copy of this summary, along with a letter will be sent to the patient's primary care provider via mail/fax/In Basket message after today's visit.    2. Bone health:  She was given education on specific activities to promote bone health.  3. Cancer screening:  Due to Ms. Joyner's history and her age, she should receive screening for skin cancers, and gynecologic cancers.  The information and recommendations are listed on the patient's comprehensive care plan/treatment summary and were reviewed in detail with the patient.    4. Health maintenance and wellness promotion: Ms. Tech was encouraged to consume 5-7 servings of fruits and vegetables per day. We reviewed the "Nutrition Rainbow" handout.  She was also encouraged to engage in moderate to vigorous  exercise for 30 minutes per day most days of the week.   She was instructed to limit her alcohol consumption and continue to abstain from tobacco use.     5. Support services/counseling: It is not uncommon for this period of the patient's cancer care trajectory to be one of many emotions and stressors. She was given information regarding our available services and encouraged to contact me with any questions or for help enrolling in any of our support group/programs.    Follow up instructions:    -Return to cancer center in 6 months -Signatera testing -She is welcome to return back to the Survivorship Clinic at any time; no additional follow-up needed at this time.  -Consider referral back to survivorship as a long-term survivor for continued surveillance  The patient was provided an opportunity to ask questions and all were answered. The patient agreed with the plan  and demonstrated an understanding of the instructions.   Total encounter time:40 minutes*in face-to-face visit time, chart review, lab review, care coordination, order entry, and documentation of the encounter time.  Wilber Bihari, NP 11/21/22 9:32 PM Medical Oncology and Hematology [facility] [place], [place] [address] Tel. [phone]    Fax. [phone]  *Total Encounter Time as defined by the Centers for Medicare and Medicaid Services includes, in addition to the face-to-face time of a patient visit (documented in the note above) non-face-to-face time: obtaining and reviewing outside history, ordering and reviewing medications, tests or procedures, care coordination (communications with other health care professionals or caregivers) and documentation in the medical record.

## 2022-11-19 ENCOUNTER — Encounter: Payer: Self-pay | Admitting: Hematology and Oncology

## 2022-11-19 ENCOUNTER — Telehealth: Payer: Self-pay | Admitting: Adult Health

## 2022-11-19 NOTE — Telephone Encounter (Signed)
Scheduled appointment per 2/1 los. Left voicemail.

## 2022-11-21 ENCOUNTER — Encounter: Payer: Self-pay | Admitting: Adult Health

## 2022-11-22 ENCOUNTER — Other Ambulatory Visit: Payer: Self-pay

## 2022-11-22 ENCOUNTER — Other Ambulatory Visit: Payer: Self-pay | Admitting: *Deleted

## 2022-11-22 DIAGNOSIS — C50411 Malignant neoplasm of upper-outer quadrant of right female breast: Secondary | ICD-10-CM

## 2022-11-22 NOTE — Progress Notes (Signed)
Signatera order placed per MD. Order requistion and all supporting documents faxed to [phone]. Fax confirmation received.

## 2022-11-22 NOTE — Progress Notes (Signed)
Received mychart message from pt requesting advice from MD on ways to gain weight.  Pt states she continues to lose weight post treatment. Verbal orders received from MD to refer pt to [employer] nutritionist for further evaluation and education.  Referral placed.

## 2022-11-29 ENCOUNTER — Inpatient Hospital Stay: Payer: Medicaid Other

## 2022-11-29 NOTE — Progress Notes (Signed)
Nutrition  Patient did not show up for nutrition appointment today.  Message sent to scheduling to offer patient another appointment with nutrition.   Jackalyn Haith B. Zenia Resides, [place], [place] Registered Dietitian [phone]

## 2022-11-30 ENCOUNTER — Telehealth: Payer: Self-pay

## 2022-11-30 NOTE — Telephone Encounter (Signed)
reScheduled per 2/12 IB unable to reach patient will try again, sent to mychart.

## 2022-12-10 ENCOUNTER — Telehealth: Payer: Self-pay

## 2022-12-10 NOTE — Telephone Encounter (Signed)
Uploaded [id number] and [id number] [phone]

## 2022-12-13 ENCOUNTER — Inpatient Hospital Stay: Payer: Medicaid Other

## 2022-12-13 ENCOUNTER — Telehealth: Payer: Self-pay

## 2022-12-13 NOTE — Progress Notes (Signed)
Nutrition  Patient did not show up for scheduled nutrition visit.    Annalysia Willenbring B. Monica [name], [place], [place] Registered Dietitian [phone]

## 2022-12-13 NOTE — Telephone Encounter (Signed)
No PA required Authorization# [id number]. CPT codes [id number] and 19370. Placed file on Heather's desk to be scheduled for surgery.

## 2022-12-13 NOTE — Telephone Encounter (Signed)
Called [facility], spoke with [employer]. [phone] Noticed the PCP is listed as the PROVIDER 1 on the print out for submission PA. Verified [facility] and [employer] always list the PCP as Provider 1.

## 2022-12-15 ENCOUNTER — Telehealth: Payer: Self-pay | Admitting: *Deleted

## 2022-12-15 LAB — SIGNATERA ONLY (NATERA MANAGED)
SIGNATERA MTM READOUT: 0 MTM/ml
SIGNATERA TEST RESULT: NEGATIVE

## 2022-12-15 NOTE — Telephone Encounter (Signed)
Per MD request, RN placed call to pt regarding negative (Not Detected) recent Signatera testing.  No answer, unable to LVM due to VM not being set up.

## 2022-12-23 ENCOUNTER — Encounter: Payer: Self-pay | Admitting: Hematology and Oncology

## 2022-12-24 ENCOUNTER — Telehealth: Payer: Self-pay | Admitting: *Deleted

## 2022-12-24 NOTE — Telephone Encounter (Signed)
LVM to schedule surgery

## 2022-12-27 ENCOUNTER — Inpatient Hospital Stay: Payer: Medicaid Other | Attending: Hematology and Oncology | Admitting: Hematology and Oncology

## 2022-12-27 NOTE — Assessment & Plan Note (Deleted)
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  Neoadjuvant chemotherapy with dose dense Adriamycin and Cytoxan followed by Taxol weekly x9 stopped 03/11/2022 2. bilateral mastectomies 05/04/2022: Left breast: Benign Right mastectomy: 9 cm tumor with 5% residual tumor margins negative, 0/8 lymph nodes negative  3.  Adjuvant radiation: 06/30/2022-08/05/2022 4.  Followed by adjuvant antiestrogen therapy (ovarian suppression) along with CDK 4 and 6 inhibitor 5.  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  Patient decided to take tamoxifen instead.   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  Return to clinic in 1 year for follow-up

## 2023-01-05 ENCOUNTER — Telehealth: Payer: Self-pay | Admitting: *Deleted

## 2023-01-05 NOTE — Telephone Encounter (Signed)
LVM to schedule surgery

## 2023-01-20 ENCOUNTER — Ambulatory Visit (INDEPENDENT_AMBULATORY_CARE_PROVIDER_SITE_OTHER): Payer: Medicaid Other | Admitting: Student

## 2023-01-20 VITALS — BP 125/85 | HR 77

## 2023-01-20 DIAGNOSIS — C50411 Malignant neoplasm of upper-outer quadrant of right female breast: Secondary | ICD-10-CM

## 2023-01-20 DIAGNOSIS — Z170 Estrogen receptor positive status [ER+]: Secondary | ICD-10-CM

## 2023-01-20 DIAGNOSIS — Z9889 Other specified postprocedural states: Secondary | ICD-10-CM

## 2023-01-20 MED ORDER — OXYCODONE HCL 5 MG PO TABS: 5.0000 mg | ORAL_TABLET | Freq: Three times a day (TID) | ORAL | 0 refills | Status: DC | PRN

## 2023-01-20 MED ORDER — ONDANSETRON HCL 4 MG PO TABS: 4.0000 mg | ORAL_TABLET | Freq: Three times a day (TID) | ORAL | 0 refills | Status: DC | PRN

## 2023-01-20 NOTE — Progress Notes (Signed)
Patient ID: Monica [name], female    DOB: Jul 21, 1996, 27 y.o.   MRN: [id number]  Chief Complaint  Patient presents with   Pre-op Exam      ICD-10-CM   1. S/P breast reconstruction  Z98.890     2. Malignant neoplasm of upper-outer quadrant of right breast in female, estrogen receptor positive  C50.411    Z17.0        History of Present Illness: Monica [name] is a 27 y.o.  female  with a history of breast cancer.  She presents for preoperative evaluation for upcoming procedure, removal of tissue expander and placement of implant and capsulorraphy, scheduled for 02/04/23 with Dr. Lovena Le.  The patient has not had problems with anesthesia.  The [facility] patient denies any history of cardiac disease.  She denies taking any blood thinners.  Patient reports she is not a smoker.  Patient denies taking any hormone replacement or birth control.  She states the only thing she is taking is tamoxifen.  She reports history of 1 miscarriage.  She denies any personal or family history of blood clots or clotting diseases.  She denies any recent surgeries, traumas, infections or hospitalizations.  She denies any history of stroke or heart attack.  She denies having a port in place.  Patient denies any history of Crohn's disease or ulcerative colitis.  She denies any history of COPD or asthma.  She does report history of cancer.  Patient denies any varicose veins.  She denies any recent fevers, chills or changes in her health.  Patient reports that she is currently around a DD cup and would like to stay around the same size.  Patient currently has 400 cc in her 600 cc expander.  Patient reports that she finished radiation.  She states that she does not need any more radiation or chemotherapy.  Summary of Previous Visit: Patient was seen by Dr. Lovena Le on 10/27/2022.  Patient had had 400 cc of saline placed in her 600 cc expanders at the time of surgery and had not undergone any further expansion since then.   Patient has undergone radiation therapy.  Patient reported she was happy with the volume of her breast but feels that they were too wide.  Patient reported that she would like to have narrow her implants.  Patient also reported pain in the right breast which extended into her arm, which was the breast that received the radiation therapy.  On exam, there were palpable tissue expanders in both breast.  The skin on the right breast was consistent with radiation damage.  Incisions had healed well.  Plan was to move forward with removal of tissue expanders and placement of permanent silicone implants.  Patient reported she was interested in the use of silicone implants.  Job: Does not work at this time  [facility] Significant for: Breast cancer   Past Medical History: Allergies: No Known Allergies  Current Medications:  Current Outpatient Medications:    tamoxifen (NOLVADEX) 20 MG tablet, Take 1 tablet (20 mg total) by mouth daily., Disp: 90 tablet, Rfl: 3  Past Medical Problems: Past Medical History:  Diagnosis Date   Abscess    Cancer ([facility]) 10/16/2021   right breast cancer   Port-A-Cath in place 11/04/2021    Past Surgical History: Past Surgical History:  Procedure Laterality Date   BREAST RECONSTRUCTION WITH PLACEMENT OF TISSUE EXPANDER AND FLEX HD (ACELLULAR HYDRATED DERMIS) Bilateral 05/04/2022   Procedure: BREAST RECONSTRUCTION WITH PLACEMENT OF TISSUE EXPANDER  AND FLEX HD (ACELLULAR HYDRATED DERMIS);  Surgeon: Lennice Sites, MD;  Location: [facility];  Service: Plastics;  Laterality: Bilateral;   LAPAROSCOPIC UNILATERAL SALPINGECTOMY Right 08/07/2022   Procedure: LAPAROSCOPIC RIGHT SALPINGECTOMY WITH REMOVAL OF ECTOPIC PREGNANCY;  Surgeon: Donnamae Jude, MD;  Location: [facility];  Service: Gynecology;  Laterality: Right;   MASTECTOMY W/ SENTINEL NODE BIOPSY Bilateral 05/04/2022   Procedure: BILATERAL SKIN SPARING MASTECTOMIES, RIGHT SEED LOCALIZED LYMPH NODE BIOPSY, RIGHT SENTINEL NODE BIOPSY;   Surgeon: Stark Klein, MD;  Location: [facility];  Service: General;  Laterality: Bilateral;   PORT-A-CATH REMOVAL Left 05/04/2022   Procedure: PORT REMOVAL;  Surgeon: Stark Klein, MD;  Location: [facility];  Service: General;  Laterality: Left;   PORTACATH PLACEMENT Left 10/27/2021   Procedure: INSERTION PORT-A-CATH;  Surgeon: Stark Klein, MD;  Location: [facility];  Service: General;  Laterality: Left;    Social History: Social History   Socioeconomic History   Marital status: Married    Spouse name: Not on file   Number of children: Not on file   Years of education: Not on file   Highest education level: Not on file  Occupational History   Not on file  Tobacco Use   Smoking status: Never   Smokeless tobacco: Never  Vaping Use   Vaping Use: Former   Quit date: 07/18/2021  Substance and Sexual Activity   Alcohol use: No   Drug use: Never   Sexual activity: Yes    Birth control/protection: Other-see comments    Comment: Depo Shot  Other Topics Concern   Not on file  Social History Narrative   Not on file   Social Determinants of Health   Financial Resource Strain: Not on file  Food Insecurity: Not on file  Transportation Needs: Not on file  Physical Activity: Not on file  Stress: Not on file  Social Connections: Not on file  Intimate Partner Violence: Not on file    Family History: Family History  Problem Relation Age of Onset   Breast cancer Maternal Grandmother 40    Review of Systems: Denies any recent fevers, chills or changes in her health  Physical Exam: Vital Signs BP 125/85 (BP Location: Left Arm, Patient Position: Sitting, Cuff Size: Small)   Pulse 77   SpO2 93%   Physical Exam  Constitutional:      General: Not in acute distress.    Appearance: Normal appearance. Not ill-appearing.  HENT:     Head: Normocephalic and atraumatic.  Neck:     Musculoskeletal: Normal range of motion.  Cardiovascular:     Rate and Rhythm: Normal rate Pulmonary:      Effort: Pulmonary effort is normal. No respiratory distress.  Musculoskeletal: Normal range of motion.  Skin:    General: Skin is warm and dry.     Findings: No erythema or rash.  Neurological:     Mental Status: Alert and oriented to person, place, and time. Mental status is at baseline.  Psychiatric:        Mood and Affect: Mood normal.        Behavior: Behavior normal.    Assessment/Plan: The patient is scheduled for removal of tissue expanders and placement of implants, capsulorrhaphy with Dr. Lovena Le.  Risks, benefits, and alternatives of procedure discussed, questions answered and consent obtained.    Smoking Status: Non-smoker; Counseling Given?  N/A Last Mammogram: History of breast cancer status post bilateral mastectomies  Caprini Score: 4; Risk Factors include: History of malignancy and length  of planned surgery. Recommendation for mechanical prophylaxis. Encourage early ambulation.   Pictures obtained: 09/24/22  Post-op Rx sent to pharmacy: Oxycodone, Zofran  Will reach out to patient's oncologist Dr. Lindi Adie for clearance to hold tamoxifen 2 weeks before and 2 weeks after surgery.  I instructed the patient to hold any herbal teas or multivitamins at least 1 week prior to surgery.  Patient expressed understanding.  Patient was provided with the [facility] checklist, National breast implant registry and the General Surgical Risk consent document and Pain Medication Agreement prior to their appointment.  They had adequate time to read through the risk consent documents and Pain Medication Agreement. We also discussed them in person together during this preop appointment. All of their questions were answered to their satisfaction.  Recommended calling if they have any further questions.  Risk consent form and Pain Medication Agreement to be scanned into patient's chart.  The consent was obtained with risks and complications reviewed which included bleeding, pain, scar, infection and the  risk of anesthesia.  The patients questions were answered to the patients expressed satisfaction.   The risk that can be encountered with breast augmentation were discussed and include the following but not limited to these:  Breast asymmetry, fluid accumulation, firmness of the breast, inability to breast feed, loss of nipple or areola, skin loss, decrease or no nipple sensation, fat necrosis of the breast tissue, bleeding, infection, healing delay.  Deep vein thrombosis, cardiac and pulmonary complications are risks to any procedure. The implant can have a faulty position or one different from what you had desired.  The implant can have rippling, wrinkling, leakage or rupture. There are risks of anesthesia, changes to skin sensation and injury to nerves or blood vessels.  The muscle can be temporarily or permanently injured.  You may have an allergic reaction to tape, suture, glue, blood products which can result in skin discoloration, swelling, pain, skin lesions, poor healing.  Any of these can lead to the need for revisonal surgery or stage procedures.  A reduction has potential to interfere with diagnostic procedures.  Nipple or breast piercing can increase risks of infection.    Weight gain and weigh loss can also effect the long term appearance. Implants are not guaranteed to last a lifetime.  Future surgery may be required.  Regular examinations of the breast are required to evaluate the condition of your breasts and implants.   I discussed with the patient that given her history of radiation, she is at higher risk of wound healing issues and asymmetry postoperatively.  Patient expressed understanding.   Electronically signed by: Clance Boll, PA-C 01/20/2023 12:39 PM

## 2023-01-20 NOTE — H&P (View-Only) (Signed)
   Patient ID: Monica [name], female    DOB: 03/23/1996, 26 y.o.   MRN: [id number]  Chief Complaint  Patient presents with   Pre-op Exam      ICD-10-CM   1. S/P breast reconstruction  Z98.890     2. Malignant neoplasm of upper-outer quadrant of right breast in female, estrogen receptor positive  C50.411    Z17.0        History of Present Illness: Monica [name] is a 26 y.o.  female  with a history of breast cancer.  She presents for preoperative evaluation for upcoming procedure, removal of tissue expander and placement of implant and capsulorraphy, scheduled for 02/04/23 with Dr. Taylor.  The patient has not had problems with anesthesia.  The [facility] patient denies any history of cardiac disease.  She denies taking any blood thinners.  Patient reports she is not a smoker.  Patient denies taking any hormone replacement or birth control.  She states the only thing she is taking is tamoxifen.  She reports history of 1 miscarriage.  She denies any personal or family history of blood clots or clotting diseases.  She denies any recent surgeries, traumas, infections or hospitalizations.  She denies any history of stroke or heart attack.  She denies having a port in place.  Patient denies any history of Crohn's disease or ulcerative colitis.  She denies any history of COPD or asthma.  She does report history of cancer.  Patient denies any varicose veins.  She denies any recent fevers, chills or changes in her health.  Patient reports that she is currently around a DD cup and would like to stay around the same size.  Patient currently has 400 cc in her 600 cc expander.  Patient reports that she finished radiation.  She states that she does not need any more radiation or chemotherapy.  Summary of Previous Visit: Patient was seen by Dr. Taylor on 10/27/2022.  Patient had had 400 cc of saline placed in her 600 cc expanders at the time of surgery and had not undergone any further expansion since then.   Patient has undergone radiation therapy.  Patient reported she was happy with the volume of her breast but feels that they were too wide.  Patient reported that she would like to have narrow her implants.  Patient also reported pain in the right breast which extended into her arm, which was the breast that received the radiation therapy.  On exam, there were palpable tissue expanders in both breast.  The skin on the right breast was consistent with radiation damage.  Incisions had healed well.  Plan was to move forward with removal of tissue expanders and placement of permanent silicone implants.  Patient reported she was interested in the use of silicone implants.  Job: Does not work at this time  [facility] Significant for: Breast cancer   Past Medical History: Allergies: No Known Allergies  Current Medications:  Current Outpatient Medications:    tamoxifen (NOLVADEX) 20 MG tablet, Take 1 tablet (20 mg total) by mouth daily., Disp: 90 tablet, Rfl: 3  Past Medical Problems: Past Medical History:  Diagnosis Date   Abscess    Cancer ([facility]) 10/16/2021   right breast cancer   Port-A-Cath in place 11/04/2021    Past Surgical History: Past Surgical History:  Procedure Laterality Date   BREAST RECONSTRUCTION WITH PLACEMENT OF TISSUE EXPANDER AND FLEX HD (ACELLULAR HYDRATED DERMIS) Bilateral 05/04/2022   Procedure: BREAST RECONSTRUCTION WITH PLACEMENT OF TISSUE EXPANDER   AND FLEX HD (ACELLULAR HYDRATED DERMIS);  Surgeon: Luppens, Daniel, MD;  Location[facility];  Service: Plastics;  Laterality: Bilateral;   LAPAROSCOPIC UNILATERAL SALPINGECTOMY Right 08/07/2022   Procedure: LAPAROSCOPIC RIGHT SALPINGECTOMY WITH REMOVAL OF ECTOPIC PREGNANCY;  Surgeon: Pratt, Tanya S, MD;  Location: [facility];  Service: Gynecology;  Laterality: Right;   MASTECTOMY W/ SENTINEL NODE BIOPSY Bilateral 05/04/2022   Procedure: BILATERAL SKIN SPARING MASTECTOMIES, RIGHT SEED LOCALIZED LYMPH NODE BIOPSY, RIGHT SENTINEL NODE BIOPSY;   Surgeon: Byerly, Faera, MD;  Location[facility];  Service: General;  Laterality: Bilateral;   PORT-A-CATH REMOVAL Left 05/04/2022   Procedure: PORT REMOVAL;  Surgeon: Byerly, Faera, MD;  Location[facility];  Service: General;  Laterality: Left;   PORTACATH PLACEMENT Left 10/27/2021   Procedure: INSERTION PORT-A-CATH;  Surgeon: Byerly, Faera, MD;  Location[facility];  Service: General;  Laterality: Left;    Social History: Social History   Socioeconomic History   Marital status: Married    Spouse name: Not on file   Number of children: Not on file   Years of education: Not on file   Highest education level: Not on file  Occupational History   Not on file  Tobacco Use   Smoking status: Never   Smokeless tobacco: Never  Vaping Use   Vaping Use: Former   Quit date: 07/18/2021  Substance and Sexual Activity   Alcohol use: No   Drug use: Never   Sexual activity: Yes    Birth control/protection: Other-see comments    Comment: Depo Shot  Other Topics Concern   Not on file  Social History Narrative   Not on file   Social Determinants of Health   Financial Resource Strain: Not on file  Food Insecurity: Not on file  Transportation Needs: Not on file  Physical Activity: Not on file  Stress: Not on file  Social Connections: Not on file  Intimate Partner Violence: Not on file    Family History: Family History  Problem Relation Age of Onset   Breast cancer Maternal Grandmother 40    Review of Systems: Denies any recent fevers, chills or changes in her health  Physical Exam: Vital Signs BP 125/85 (BP Location: Left Arm, Patient Position: Sitting, Cuff Size: Small)   Pulse 77   SpO2 93%   Physical Exam  Constitutional:      General: Not in acute distress.    Appearance: Normal appearance. Not ill-appearing.  HENT:     Head: Normocephalic and atraumatic.  Neck:     Musculoskeletal: Normal range of motion.  Cardiovascular:     Rate and Rhythm: Normal rate Pulmonary:      Effort: Pulmonary effort is normal. No respiratory distress.  Musculoskeletal: Normal range of motion.  Skin:    General: Skin is warm and dry.     Findings: No erythema or rash.  Neurological:     Mental Status: Alert and oriented to person, place, and time. Mental status is at baseline.  Psychiatric:        Mood and Affect: Mood normal.        Behavior: Behavior normal.    Assessment/Plan: The patient is scheduled for removal of tissue expanders and placement of implants, capsulorrhaphy with Dr. Taylor.  Risks, benefits, and alternatives of procedure discussed, questions answered and consent obtained.    Smoking Status: Non-smoker; Counseling Given?  N/A Last Mammogram: History of breast cancer status post bilateral mastectomies  Caprini Score: 4; Risk Factors include: History of malignancy and length   of planned surgery. Recommendation for mechanical prophylaxis. Encourage early ambulation.   Pictures obtained: 09/24/22  Post-op Rx sent to pharmacy: Oxycodone, Zofran  Will reach out to patient's oncologist Dr. Gudena for clearance to hold tamoxifen 2 weeks before and 2 weeks after surgery.  I instructed the patient to hold any herbal teas or multivitamins at least 1 week prior to surgery.  Patient expressed understanding.  Patient was provided with the [facility] checklist, National breast implant registry and the General Surgical Risk consent document and Pain Medication Agreement prior to their appointment.  They had adequate time to read through the risk consent documents and Pain Medication Agreement. We also discussed them in person together during this preop appointment. All of their questions were answered to their satisfaction.  Recommended calling if they have any further questions.  Risk consent form and Pain Medication Agreement to be scanned into patient's chart.  The consent was obtained with risks and complications reviewed which included bleeding, pain, scar, infection and the  risk of anesthesia.  The patients questions were answered to the patients expressed satisfaction.   The risk that can be encountered with breast augmentation were discussed and include the following but not limited to these:  Breast asymmetry, fluid accumulation, firmness of the breast, inability to breast feed, loss of nipple or areola, skin loss, decrease or no nipple sensation, fat necrosis of the breast tissue, bleeding, infection, healing delay.  Deep vein thrombosis, cardiac and pulmonary complications are risks to any procedure. The implant can have a faulty position or one different from what you had desired.  The implant can have rippling, wrinkling, leakage or rupture. There are risks of anesthesia, changes to skin sensation and injury to nerves or blood vessels.  The muscle can be temporarily or permanently injured.  You may have an allergic reaction to tape, suture, glue, blood products which can result in skin discoloration, swelling, pain, skin lesions, poor healing.  Any of these can lead to the need for revisonal surgery or stage procedures.  A reduction has potential to interfere with diagnostic procedures.  Nipple or breast piercing can increase risks of infection.    Weight gain and weigh loss can also effect the long term appearance. Implants are not guaranteed to last a lifetime.  Future surgery may be required.  Regular examinations of the breast are required to evaluate the condition of your breasts and implants.   I discussed with the patient that given her history of radiation, she is at higher risk of wound healing issues and asymmetry postoperatively.  Patient expressed understanding.   Electronically signed by: Abbiegail Landgren E Rosezetta Balderston, PA-C 01/20/2023 12:39 PM  

## 2023-01-21 ENCOUNTER — Other Ambulatory Visit: Payer: Self-pay | Admitting: Student

## 2023-01-21 ENCOUNTER — Telehealth: Payer: Self-pay | Admitting: Student

## 2023-01-21 MED ORDER — SULFAMETHOXAZOLE-TRIMETHOPRIM 800-160 MG PO TABS: 1.0000 | ORAL_TABLET | Freq: Two times a day (BID) | ORAL | 0 refills | Status: AC

## 2023-01-21 NOTE — Progress Notes (Signed)
Antibiotics for patient to take postoperatively. Called patient and instructed her to bactrim for 5 days after surgery. Patient expressed understanding.

## 2023-01-21 NOTE — Telephone Encounter (Signed)
Received clearance from Dr. Pamelia Hoit, patient's oncologist, to hold tamoxifen 2 weeks before and 2 weeks after surgery. Patient was called and made aware.

## 2023-01-31 ENCOUNTER — Encounter (HOSPITAL_BASED_OUTPATIENT_CLINIC_OR_DEPARTMENT_OTHER): Payer: Self-pay | Admitting: Plastic Surgery

## 2023-01-31 ENCOUNTER — Other Ambulatory Visit: Payer: Self-pay

## 2023-02-04 ENCOUNTER — Other Ambulatory Visit: Payer: Self-pay

## 2023-02-04 ENCOUNTER — Encounter (HOSPITAL_BASED_OUTPATIENT_CLINIC_OR_DEPARTMENT_OTHER)
Admission: RE | Disposition: A | Discharge status: Home / Self Care | Payer: Self-pay | Source: Home / Self Care | Attending: Plastic Surgery

## 2023-02-04 ENCOUNTER — Encounter (HOSPITAL_BASED_OUTPATIENT_CLINIC_OR_DEPARTMENT_OTHER): Payer: Self-pay | Admitting: Plastic Surgery

## 2023-02-04 ENCOUNTER — Ambulatory Visit (HOSPITAL_BASED_OUTPATIENT_CLINIC_OR_DEPARTMENT_OTHER): Payer: Medicaid Other | Admitting: Anesthesiology

## 2023-02-04 ENCOUNTER — Ambulatory Visit (HOSPITAL_BASED_OUTPATIENT_CLINIC_OR_DEPARTMENT_OTHER)
Admission: RE | Admit: 2023-02-04 | Discharge: 2023-02-04 | Disposition: A | Discharge status: Home / Self Care | Payer: Medicaid Other | Attending: Plastic Surgery | Admitting: Plastic Surgery

## 2023-02-04 DIAGNOSIS — Z170 Estrogen receptor positive status [ER+]: Secondary | ICD-10-CM | POA: Insufficient documentation

## 2023-02-04 DIAGNOSIS — Z79810 Long term (current) use of selective estrogen receptor modulators (SERMs): Secondary | ICD-10-CM | POA: Diagnosis not present

## 2023-02-04 DIAGNOSIS — C50911 Malignant neoplasm of unspecified site of right female breast: Secondary | ICD-10-CM | POA: Insufficient documentation

## 2023-02-04 DIAGNOSIS — Z01818 Encounter for other preprocedural examination: Secondary | ICD-10-CM

## 2023-02-04 DIAGNOSIS — Z9013 Acquired absence of bilateral breasts and nipples: Secondary | ICD-10-CM | POA: Diagnosis not present

## 2023-02-04 DIAGNOSIS — Z421 Encounter for breast reconstruction following mastectomy: Secondary | ICD-10-CM

## 2023-02-04 DIAGNOSIS — Z923 Personal history of irradiation: Secondary | ICD-10-CM | POA: Insufficient documentation

## 2023-02-04 DIAGNOSIS — Z853 Personal history of malignant neoplasm of breast: Secondary | ICD-10-CM

## 2023-02-04 HISTORY — PX: REMOVAL OF TISSUE EXPANDER AND PLACEMENT OF IMPLANT: SHX6457

## 2023-02-04 LAB — POCT PREGNANCY, URINE: Preg Test, Ur: NEGATIVE

## 2023-02-04 SURGERY — REMOVAL, TISSUE EXPANDER, BREAST, WITH IMPLANT INSERTION
Anesthesia: General | Site: Breast | Laterality: Bilateral

## 2023-02-04 MED ORDER — OXYCODONE HCL 5 MG/5ML PO SOLN: 5.0000 mg | Freq: Once | ORAL | Status: AC | PRN

## 2023-02-04 MED ORDER — 0.9 % SODIUM CHLORIDE (POUR BTL) OPTIME
TOPICAL | Status: DC | PRN
  Administered 2023-02-04: 600 mL

## 2023-02-04 MED ORDER — ONDANSETRON HCL 4 MG/2ML IJ SOLN
INTRAMUSCULAR | Status: AC
  Filled 2023-02-04: qty 2

## 2023-02-04 MED ORDER — PHENYLEPHRINE 80 MCG/ML (10ML) SYRINGE FOR IV PUSH (FOR BLOOD PRESSURE SUPPORT)
PREFILLED_SYRINGE | INTRAVENOUS | Status: DC | PRN
  Administered 2023-02-04: 80 ug via INTRAVENOUS
  Administered 2023-02-04: 160 ug via INTRAVENOUS
  Administered 2023-02-04: 80 ug via INTRAVENOUS
  Administered 2023-02-04: 40 ug via INTRAVENOUS

## 2023-02-04 MED ORDER — LACTATED RINGERS IV SOLN: INTRAVENOUS | Status: DC

## 2023-02-04 MED ORDER — SODIUM CHLORIDE 0.9 % IV SOLN
INTRAVENOUS | Status: AC
  Filled 2023-02-04: qty 10

## 2023-02-04 MED ORDER — POVIDONE-IODINE 10 % EX SOLN
CUTANEOUS | Status: DC | PRN
  Administered 2023-02-04: 1 via TOPICAL

## 2023-02-04 MED ORDER — DEXAMETHASONE SODIUM PHOSPHATE 4 MG/ML IJ SOLN
INTRAMUSCULAR | Status: DC | PRN
  Administered 2023-02-04: 8 mg via INTRAVENOUS

## 2023-02-04 MED ORDER — ONDANSETRON HCL 4 MG/2ML IJ SOLN
INTRAMUSCULAR | Status: DC | PRN
  Administered 2023-02-04: 4 mg via INTRAVENOUS

## 2023-02-04 MED ORDER — MIDAZOLAM HCL 2 MG/2ML IJ SOLN
INTRAMUSCULAR | Status: AC
  Filled 2023-02-04: qty 2

## 2023-02-04 MED ORDER — OXYCODONE HCL 5 MG PO TABS
ORAL_TABLET | ORAL | Status: AC
  Filled 2023-02-04: qty 1

## 2023-02-04 MED ORDER — FENTANYL CITRATE (PF) 100 MCG/2ML IJ SOLN
25.0000 ug | INTRAMUSCULAR | Status: DC | PRN
  Administered 2023-02-04 (×2): 50 ug via INTRAVENOUS

## 2023-02-04 MED ORDER — SODIUM CHLORIDE 0.9 % IV SOLN: INTRAVENOUS | Status: DC | PRN

## 2023-02-04 MED ORDER — CEFAZOLIN SODIUM-DEXTROSE 2-4 GM/100ML-% IV SOLN
INTRAVENOUS | Status: AC
  Filled 2023-02-04: qty 100

## 2023-02-04 MED ORDER — CHLORHEXIDINE GLUCONATE CLOTH 2 % EX PADS: 6.0000 | MEDICATED_PAD | Freq: Once | CUTANEOUS | Status: DC

## 2023-02-04 MED ORDER — PROPOFOL 10 MG/ML IV BOLUS
INTRAVENOUS | Status: DC | PRN
  Administered 2023-02-04: 200 mg via INTRAVENOUS

## 2023-02-04 MED ORDER — DEXAMETHASONE SODIUM PHOSPHATE 10 MG/ML IJ SOLN
INTRAMUSCULAR | Status: AC
  Filled 2023-02-04: qty 1

## 2023-02-04 MED ORDER — LIDOCAINE HCL (CARDIAC) PF 100 MG/5ML IV SOSY
PREFILLED_SYRINGE | INTRAVENOUS | Status: DC | PRN
  Administered 2023-02-04: 100 mg via INTRAVENOUS

## 2023-02-04 MED ORDER — LIDOCAINE 2% (20 MG/ML) 5 ML SYRINGE
INTRAMUSCULAR | Status: AC
  Filled 2023-02-04: qty 5

## 2023-02-04 MED ORDER — CEFAZOLIN SODIUM-DEXTROSE 2-4 GM/100ML-% IV SOLN
2.0000 g | INTRAVENOUS | Status: AC
  Administered 2023-02-04: 2 g via INTRAVENOUS

## 2023-02-04 MED ORDER — KETOROLAC TROMETHAMINE 30 MG/ML IJ SOLN
30.0000 mg | Freq: Once | INTRAMUSCULAR | Status: AC | PRN
  Administered 2023-02-04: 30 mg via INTRAVENOUS

## 2023-02-04 MED ORDER — FENTANYL CITRATE (PF) 100 MCG/2ML IJ SOLN
INTRAMUSCULAR | Status: AC
  Filled 2023-02-04: qty 2

## 2023-02-04 MED ORDER — FENTANYL CITRATE (PF) 100 MCG/2ML IJ SOLN
INTRAMUSCULAR | Status: DC | PRN
  Administered 2023-02-04 (×2): 50 ug via INTRAVENOUS

## 2023-02-04 MED ORDER — OXYCODONE HCL 5 MG PO TABS
5.0000 mg | ORAL_TABLET | Freq: Once | ORAL | Status: AC | PRN
  Administered 2023-02-04: 5 mg via ORAL

## 2023-02-04 MED ORDER — MIDAZOLAM HCL 5 MG/5ML IJ SOLN
INTRAMUSCULAR | Status: DC | PRN
  Administered 2023-02-04: 2 mg via INTRAVENOUS

## 2023-02-04 MED ORDER — ONDANSETRON HCL 4 MG/2ML IJ SOLN: 4.0000 mg | Freq: Once | INTRAMUSCULAR | Status: DC | PRN

## 2023-02-04 MED ORDER — KETOROLAC TROMETHAMINE 30 MG/ML IJ SOLN
INTRAMUSCULAR | Status: AC
  Filled 2023-02-04: qty 1

## 2023-02-04 MED ORDER — KETOROLAC TROMETHAMINE 30 MG/ML IJ SOLN: INTRAMUSCULAR | Status: DC | PRN

## 2023-02-04 SURGICAL SUPPLY — 68 items
ADH SKN CLS APL DERMABOND .7 (GAUZE/BANDAGES/DRESSINGS) ×2
APL PRP STRL LF DISP 70% ISPRP (MISCELLANEOUS) ×2
BAG DECANTER FOR FLEXI CONT (MISCELLANEOUS) ×1 IMPLANT
BINDER BREAST LRG (GAUZE/BANDAGES/DRESSINGS) IMPLANT
BINDER BREAST XLRG (GAUZE/BANDAGES/DRESSINGS) IMPLANT
BINDER BREAST XXLRG (GAUZE/BANDAGES/DRESSINGS) IMPLANT
BIOPATCH RED 1 DISK 7.0 (GAUZE/BANDAGES/DRESSINGS) IMPLANT
BLADE SURG 10 STRL SS (BLADE) IMPLANT
BLADE SURG 15 STRL LF DISP TIS (BLADE) ×1 IMPLANT
BLADE SURG 15 STRL SS (BLADE) ×1
CANISTER SUCT 1200ML W/VALVE (MISCELLANEOUS) ×1 IMPLANT
CHLORAPREP W/TINT 26 (MISCELLANEOUS) ×2 IMPLANT
DERMABOND ADVANCED .7 DNX12 (GAUZE/BANDAGES/DRESSINGS) IMPLANT
DRAIN CHANNEL 15F RND FF W/TCR (WOUND CARE) IMPLANT
ELECT BLADE 4.0 EZ CLEAN MEGAD (MISCELLANEOUS)
ELECT COATED BLADE 2.86 ST (ELECTRODE) ×1 IMPLANT
ELECT REM PT RETURN 9FT ADLT (ELECTROSURGICAL) ×1
ELECTRODE BLDE 4.0 EZ CLN MEGD (MISCELLANEOUS) IMPLANT
ELECTRODE REM PT RTRN 9FT ADLT (ELECTROSURGICAL) ×1 IMPLANT
EVACUATOR SILICONE 100CC (DRAIN) IMPLANT
FUNNEL KELLER 2 DISP (MISCELLANEOUS) IMPLANT
GAUZE PAD ABD 8X10 STRL (GAUZE/BANDAGES/DRESSINGS) ×2 IMPLANT
GLOVE BIO SURGEON STRL SZ 6.5 (GLOVE) IMPLANT
GLOVE BIO SURGEON STRL SZ8 (GLOVE) ×1 IMPLANT
GLOVE BIOGEL M STRL SZ7.5 (GLOVE) ×1 IMPLANT
GLOVE BIOGEL PI IND STRL 7.5 (GLOVE) ×1 IMPLANT
GLOVE BIOGEL PI IND STRL 8 (GLOVE) ×1 IMPLANT
GLOVE SURG SS PI 7.0 STRL IVOR (GLOVE) IMPLANT
GOWN STRL REUS W/ TWL LRG LVL3 (GOWN DISPOSABLE) ×2 IMPLANT
GOWN STRL REUS W/ TWL XL LVL3 (GOWN DISPOSABLE) ×1 IMPLANT
GOWN STRL REUS W/TWL LRG LVL3 (GOWN DISPOSABLE) ×2
GOWN STRL REUS W/TWL XL LVL3 (GOWN DISPOSABLE) ×1
IMPL BREAST P5.3XHI PRFL RND (Breast) IMPLANT
IMPL BRST P5.3XHI PRFL RND (Breast) ×2 IMPLANT
IMPLANT SILICONE 475CC (Breast) ×2 IMPLANT
IV NS 500ML (IV SOLUTION)
IV NS 500ML BAXH (IV SOLUTION) IMPLANT
KIT FILL ASEPTIC TRANSFER (MISCELLANEOUS) IMPLANT
MARKER SKIN DUAL TIP RULER LAB (MISCELLANEOUS) IMPLANT
NDL HYPO 25X1 1.5 SAFETY (NEEDLE) ×1 IMPLANT
NEEDLE HYPO 25X1 1.5 SAFETY (NEEDLE) ×1 IMPLANT
PACK BASIN DAY SURGERY FS (CUSTOM PROCEDURE TRAY) ×1 IMPLANT
PACK UNIVERSAL I (CUSTOM PROCEDURE TRAY) ×1 IMPLANT
PENCIL SMOKE EVACUATOR (MISCELLANEOUS) ×1 IMPLANT
PIN SAFETY STERILE (MISCELLANEOUS) IMPLANT
SIZER BREAST GEL REUSE 475CC (SIZER) ×2
SIZER BREAST HP REUSE 500CC (SIZER) ×1
SIZER BRST GEL REUSE 475CC (SIZER) IMPLANT
SIZER BRST HP REUSE 500CC (SIZER) IMPLANT
SLEEVE SCD COMPRESS KNEE MED (STOCKING) ×1 IMPLANT
SPIKE FLUID TRANSFER (MISCELLANEOUS) IMPLANT
SPONGE T-LAP 18X18 ~~LOC~~+RFID (SPONGE) ×1 IMPLANT
STAPLER INSORB 30 2030 C-SECTI (MISCELLANEOUS) IMPLANT
STAPLER VISISTAT 35W (STAPLE) ×1 IMPLANT
STRIP SUTURE WOUND CLOSURE 1/2 (MISCELLANEOUS) ×2 IMPLANT
SUT MNCRL AB 3-0 PS2 27 (SUTURE) ×2 IMPLANT
SUT MNCRL AB 4-0 PS2 18 (SUTURE) ×2 IMPLANT
SUT SILK 2 0 SH (SUTURE) IMPLANT
SUT VIC AB 3-0 SH 27 (SUTURE) ×2
SUT VIC AB 3-0 SH 27X BRD (SUTURE) ×2 IMPLANT
SUT VICRYL 3-0 CR8 SH (SUTURE) IMPLANT
SYR BULB IRRIG 60ML STRL (SYRINGE) ×1 IMPLANT
SYR CONTROL 10ML LL (SYRINGE) ×1 IMPLANT
TOWEL GREEN STERILE FF (TOWEL DISPOSABLE) ×2 IMPLANT
TRAY DSU PREP LF (CUSTOM PROCEDURE TRAY) IMPLANT
TUBE CONNECTING 20X1/4 (TUBING) ×1 IMPLANT
UNDERPAD 30X36 HEAVY ABSORB (UNDERPADS AND DIAPERS) ×2 IMPLANT
YANKAUER SUCT BULB TIP NO VENT (SUCTIONS) ×1 IMPLANT

## 2023-02-04 NOTE — Op Note (Signed)
DATE OF OPERATION: 02/04/2023  LOCATION: [facility] operating Room  PREOPERATIVE DIAGNOSIS: Right breast cancer  POSTOPERATIVE DIAGNOSIS: Same  PROCEDURE: Removal of bilateral tissue expanders and placement of permanent silicone implants  SURGEON: Loren Racer, MD  ASSISTANT: Caroline More, primary, Matt Scheeler  EBL: 15 cc  CONDITION: Stable  COMPLICATIONS: None  INDICATION: The patient, Monica [name], is a 27 y.o. female born on 06/23/1996, is here for the next stage in her breast reconstruction.  She underwent bilateral mastectomy with tissue expander placement.  Subsequently she underwent radiation therapy on the right side.  She presents today for removal of the tissue expanders and placement of her permanent silicone implants  PROCEDURE DETAILS:  The patient was seen prior to surgery and marked.   IV antibiotics were given. The patient was taken to the operating room and given a general anesthetic. A standard time out was performed and all information was confirmed by those in the room. SCDs were placed.   The chest was prepped and draped in the usual sterile manner.  The right breast was addressed first the previous scar was incised and dissection carried out down to the level of the previously placed ADM.  The ADM was incised and the tissue expander was removed.  The pocket was inspected and the ADM was noted to be intact.  There was no bleeding.  The pocket was irrigated and a 500 cc high-profile Mentor gel sizer was placed.  The skin was temporarily closed with skin clips.  Attention was then turned to the left side where the previous scar was incised and the tissue expander removed.  A 475 cc sizer was placed in this pocket and the skin closed I felt that the 475 cc sizer gave the patient a more aesthetic appearance.  I remove the 500 cc sizer on the right side and placed a 475 cc sizer the patient patient was then placed in the sitting position where I noted that she had  less ptosis on the right side.  The sizer was removed and the ADM and capsule were scored to allow more room at the most inferior portion of the breast.  The sizer was replaced and the patient again inspected in the sitting position she had a much nicer symmetry and better ptosis on the right side.  The sizers were removed from both breast and the pockets irrigated with dilute Betadine solution.  Meticulous hemostasis was ensured on both sides.  The skin was reprepped with Betadine the entire surgical team changed gloves and Mentor 475 cc high-profile silicone gel implants were placed bilaterally.  The capsule was reapproximated with interrupted 3-0 Vicryl sutures and the the dermis approximated with interrupted 3-0 Monocryl sutures.  The skin was closed with running 4-0 Monocryl  subcuticular stitches and the incision sealed with Dermabond.  The patient tolerated the procedure well there were no complications and all instrument needle and sponge counts were reported as correct. The patient was allowed to wake up and taken to recovery room in stable condition at the end of the case. The family was notified at the end of the case.   The advanced practice practitioner (APP) assisted throughout the case.  The APP was essential in retraction and counter traction when needed to make the case progress smoothly.  This retraction and assistance made it possible to see the tissue plans for the procedure.  The assistance was needed for blood control, tissue re-approximation and assisted with closure of the incision site.

## 2023-02-04 NOTE — Anesthesia Procedure Notes (Signed)
Procedure Name: LMA Insertion Date/Time: 02/04/2023 11:52 AM  Performed by: Lance Coon, CRNAPre-anesthesia Checklist: Patient identified, Emergency Drugs available, Suction available and Patient being monitored Patient Re-evaluated:Patient Re-evaluated prior to induction Oxygen Delivery Method: Circle system utilized Preoxygenation: Pre-oxygenation with 100% oxygen Induction Type: IV induction Ventilation: Mask ventilation without difficulty LMA: LMA inserted LMA Size: 4.0 Number of attempts: 1 Airway Equipment and Method: Bite block Placement Confirmation: positive ETCO2 Tube secured with: Tape Dental Injury: Teeth and Oropharynx as per pre-operative assessment

## 2023-02-04 NOTE — Interval H&P Note (Signed)
History and Physical Interval Note: Pt met in the pre op area, no change to physical exam or indication for surgery. Surgical site marked and questions answered to her satisfaction. Will proceed with tissue expander to implant exchange at her request.  02/04/2023 11:10 AM  Monica [name]  has presented today for surgery, with the diagnosis of Malignant neoplasm of upper-outer quadrant of right breast in female, estrogen receptor positive.  The various methods of treatment have been discussed with the patient and family. After consideration of risks, benefits and other options for treatment, the patient has consented to  Procedure(s): REMOVAL OF TISSUE EXPANDER AND PLACEMENT OF IMPLANT, capsulorraphy (Bilateral) as a surgical intervention.  The patient's history has been reviewed, patient examined, no change in status, stable for surgery.  I have reviewed the patient's chart and labs.  Questions were answered to the patient's satisfaction.     Santiago Glad

## 2023-02-04 NOTE — Transfer of Care (Signed)
Immediate Anesthesia Transfer of Care Note  Patient: Monica [name]  Procedure(s) Performed: REMOVAL OF TISSUE EXPANDER AND PLACEMENT OF IMPLANT, capsulorraphy (Bilateral: Breast)  Patient Location: PACU  Anesthesia Type:General  Level of Consciousness: sedated  Airway & Oxygen Therapy: Patient Spontanous Breathing and Patient connected to face mask oxygen  Post-op Assessment: Report given to RN and Post -op Vital signs reviewed and stable  Post vital signs: Reviewed and stable  Last Vitals:  Vitals Value Taken Time  BP 114/82 02/04/23 1323  Temp    Pulse 73 02/04/23 1324  Resp 20 02/04/23 1324  SpO2 100 % 02/04/23 1324  Vitals shown include unvalidated device data.  Last Pain:  Vitals:   02/04/23 0942  TempSrc: Oral  PainSc: 0-No pain      Patients Stated Pain Goal: 3 (02/04/23 9147)  Complications: No notable events documented.

## 2023-02-04 NOTE — Discharge Instructions (Addendum)
Activity As tolerated. NO showers for 48 hours. Wear breast compression binder 24/7 unless adjusting/showering. NO driving while in pain, taking pain medication or if you are unable to safely react to traffic. No heavy activities Take prescribed pain medication as needed for severe pain. Otherwise, you can use ibuprofen or tylenol as needed. Avoid more than 3,000 mg of tylenol in 24 hours.  You do not need an antibiotic post-operatively unless this was discussed with the provider at your pre-op appointment.  Diet: Regular. Drink plenty of fluids and eat healthy (high protein, low carbs), Try to optimize your nutrition with plenty of fruits and vegetables to improve healing. Protein shakes are a good option.  Wound Care: Keep dressing clean & dry. You may change bandages after showering if you continue to notice some drainage.   Special Instructions: Call Doctor if any unusual problems occur such as pain, excessive Bleeding, unrelieved Nausea/vomiting, Fever &/or chills  Follow-up appointment: Previously scheduled.  Post Anesthesia Home Care Instructions  Activity: Get plenty of rest for the remainder of the day. A responsible individual must stay with you for 24 hours following the procedure.  For the next 24 hours, DO NOT: -Drive a car -Advertising copywriter -Drink alcoholic beverages -Take any medication unless instructed by your physician -Make any legal decisions or sign important papers.  Meals: Start with liquid foods such as gelatin or soup. Progress to regular foods as tolerated. Avoid greasy, spicy, heavy foods. If nausea and/or vomiting occur, drink only clear liquids until the nausea and/or vomiting subsides. Call your physician if vomiting continues.  Special Instructions/Symptoms: Your throat may feel dry or sore from the anesthesia or the breathing tube placed in your throat during surgery. If this causes discomfort, gargle with warm salt water. The discomfort should  disappear within 24 hours.  If you had a scopolamine patch placed behind your ear for the management of post- operative nausea and/or vomiting:  1. The medication in the patch is effective for 72 hours, after which it should be removed.  Wrap patch in a tissue and discard in the trash. Wash hands thoroughly with soap and water. 2. You may remove the patch earlier than 72 hours if you experience unpleasant side effects which may include dry mouth, dizziness or visual disturbances. 3. Avoid touching the patch. Wash your hands with soap and water after contact with the patch.    Next dose of NSAID (Ibuprofen/Motrin/Aleve) may be given at 7:37pm, if needed.  You had  of Oxycodone at 2:15pm today.

## 2023-02-04 NOTE — Anesthesia Postprocedure Evaluation (Signed)
Anesthesia Post Note  Patient: Monica [name]  Procedure(s) Performed: REMOVAL OF TISSUE EXPANDER AND PLACEMENT OF IMPLANT, capsulorraphy (Bilateral: Breast)     Patient location during evaluation: PACU Anesthesia Type: General Level of consciousness: awake and alert Pain management: pain level controlled Vital Signs Assessment: post-procedure vital signs reviewed and stable Respiratory status: spontaneous breathing, nonlabored ventilation, respiratory function stable and patient connected to nasal cannula oxygen Cardiovascular status: blood pressure returned to baseline and stable Postop Assessment: no apparent nausea or vomiting Anesthetic complications: no  No notable events documented.  Last Vitals:  Vitals:   02/04/23 1400 02/04/23 1422  BP: 115/86 (!) 138/96  Pulse: 75 78  Resp: 18 16  Temp:  36.7 C  SpO2: 96% 100%    Last Pain:  Vitals:   02/04/23 1413  TempSrc:   PainSc: 7                  Monica [name] S

## 2023-02-04 NOTE — Anesthesia Preprocedure Evaluation (Signed)
Anesthesia Evaluation  Patient identified by MRN, date of birth, ID band Patient awake    Reviewed: Allergy & Precautions, H&P , NPO status , Patient's Chart, lab work & pertinent test results  Airway Mallampati: I  TM Distance: >3 FB Neck ROM: Full    Dental no notable dental hx.    Pulmonary neg pulmonary ROS   Pulmonary exam normal breath sounds clear to auscultation       Cardiovascular negative cardio ROS Normal cardiovascular exam Rhythm:Regular Rate:Normal     Neuro/Psych negative neurological ROS  negative psych ROS   GI/Hepatic negative GI ROS, Neg liver ROS,,,  Endo/Other  negative endocrine ROS    Renal/GU negative Renal ROS  negative genitourinary   Musculoskeletal negative musculoskeletal ROS (+)    Abdominal   Peds negative pediatric ROS (+)  Hematology negative hematology ROS (+)   Anesthesia Other Findings   Reproductive/Obstetrics negative OB ROS                             Anesthesia Physical Anesthesia Plan  ASA: 2  Anesthesia Plan: General   Post-op Pain Management: Minimal or no pain anticipated   Induction: Intravenous  PONV Risk Score and Plan: 3 and Ondansetron, Dexamethasone and Treatment may vary due to age or medical condition  Airway Management Planned: LMA  Additional Equipment:   Intra-op Plan:   Post-operative Plan: Extubation in OR  Informed Consent: I have reviewed the patients History and Physical, chart, labs and discussed the procedure including the risks, benefits and alternatives for the proposed anesthesia with the patient or authorized representative who has indicated his/her understanding and acceptance.     Dental advisory given  Plan Discussed with: CRNA and Surgeon  Anesthesia Plan Comments:        Anesthesia Quick Evaluation

## 2023-02-07 ENCOUNTER — Encounter (HOSPITAL_BASED_OUTPATIENT_CLINIC_OR_DEPARTMENT_OTHER): Payer: Self-pay | Admitting: Plastic Surgery

## 2023-02-10 ENCOUNTER — Ambulatory Visit (INDEPENDENT_AMBULATORY_CARE_PROVIDER_SITE_OTHER): Payer: Medicaid Other | Admitting: Plastic Surgery

## 2023-02-10 ENCOUNTER — Encounter: Payer: Self-pay | Admitting: Plastic Surgery

## 2023-02-10 VITALS — BP 98/62 | HR 92 | Resp 16

## 2023-02-10 DIAGNOSIS — Z9889 Other specified postprocedural states: Secondary | ICD-10-CM

## 2023-02-10 MED ORDER — OXYCODONE-ACETAMINOPHEN 5-325 MG PO TABS: 1.0000 | ORAL_TABLET | Freq: Three times a day (TID) | ORAL | 0 refills | Status: AC | PRN

## 2023-02-10 NOTE — Progress Notes (Signed)
Ms. Monica [name] returns today 1 week postop from exchange of her tissue expanders to implants.  She has no complaints except for mild discomfort especially on the left side.  Overall she is happy with the results.  She is a little larger with a little more ptosis on the left but this was to be expected since the right side had been radiated.  After as she has had time for the skin to relax if she still has and notable asymmetry I can revise this with the skin resection.  Will prescribe a few more oxycodone for her pain and she will follow-up at her next scheduled appointment which should be approximately 1 week.

## 2023-02-23 ENCOUNTER — Ambulatory Visit (INDEPENDENT_AMBULATORY_CARE_PROVIDER_SITE_OTHER): Payer: Medicaid Other | Admitting: Student

## 2023-02-23 DIAGNOSIS — Z9889 Other specified postprocedural states: Secondary | ICD-10-CM

## 2023-02-23 NOTE — Progress Notes (Signed)
Patient is a 27 year old female with history of breast cancer status post bilateral mastectomies.  Patient most recently underwent removal of bilateral tissue expanders and placement of permanent silicone implants with Dr. Ladona Ridgel on 02/04/2023.  Intraoperatively, patient had 475 cc Mentor high-profile silicone gel implants placed bilaterally.  Patient is approximately 2-1/2 weeks postop.  She presents to the clinic today for postoperative follow-up.  Patient was last seen in the clinic on 02/10/2023.  At this visit, patient had no complaints except for some mild discomfort to the left side.  Patient reported she was overall happy with the results.  It was noted that the patient was a little larger with a little more ptosis on the left, but this was to be expected as the right side had been radiated.  Plan is for patient to follow-up in about a week or so.  Today, patient reports he is doing well.  She reports that her left breast is slightly bigger than her right breast.  She denies any drainage from her incisions.  She denies any fevers or chills.  She denies any other issues or concerns at this time.  Chaperone present on exam.  On exam, patient is sitting upright in no acute distress.  Breasts are soft bilaterally.  Right breast is slightly a little bit tighter than the left breast, given that patient has had radiation to the right side.  There is no overlying erythema.  There are no fluid collections palpated on exam.  Incisions are clean dry and intact bilaterally.  There are no signs of infection on exam.  Patient about 2-1/2 weeks postop from her expander removal and implant placement.  She is doing well.  I discussed with the patient that in terms of her symmetry, she will have to give it a little bit more time to let the implant settle out, but she is most likely going to be asymmetric as she has had radiation on the right side.  Patient expressed understanding.  We will have the patient follow back  up in a few weeks for another reevaluation.

## 2023-03-09 ENCOUNTER — Encounter: Payer: Medicaid Other | Admitting: Student

## 2023-03-10 LAB — SIGNATERA
SIGNATERA MTM READOUT: 0 MTM/ml
SIGNATERA TEST RESULT: NEGATIVE

## 2023-03-17 ENCOUNTER — Encounter: Payer: Self-pay | Admitting: Plastic Surgery

## 2023-03-17 ENCOUNTER — Ambulatory Visit (INDEPENDENT_AMBULATORY_CARE_PROVIDER_SITE_OTHER): Payer: Medicaid Other | Admitting: Plastic Surgery

## 2023-03-17 VITALS — BP 122/89 | HR 91

## 2023-03-17 DIAGNOSIS — Z9889 Other specified postprocedural states: Secondary | ICD-10-CM

## 2023-03-17 NOTE — Progress Notes (Signed)
Monica [name] returns today approximately 6 weeks after removal of her tissue expanders and placement of implants.  She states she is doing well with little to no pain and the implants feel good.  Says she is able to sleep on her stomach with no discomfort.  On physical exam the incisions are clean dry and intact.  Little bit of wrinkling at the superior pole of the left breast.  Monica [name] has asked that I try to revise the left side to deal with the wrinkling.  I can potentially do a capsulorrhaphy with a small amount of fat grafting to the left breast to improve the appearance.  She understands that there is no guarantee that this will work and that potentially she will have the same stretching of the skin due to the weight of the implant.  She understands and asks that I attempt.  Photographs were obtained today with her consent.  All questions answered to her satisfaction.  Will submit her for capsulorrhaphy and fat grafting

## 2023-03-24 ENCOUNTER — Encounter: Payer: Self-pay | Admitting: Hematology and Oncology

## 2023-03-25 ENCOUNTER — Other Ambulatory Visit: Payer: Self-pay

## 2023-03-25 DIAGNOSIS — C50411 Malignant neoplasm of upper-outer quadrant of right female breast: Secondary | ICD-10-CM

## 2023-03-30 NOTE — Therapy (Signed)
OUTPATIENT PHYSICAL THERAPY  UPPER EXTREMITY ONCOLOGY EVALUATION  Patient Name: Monica [name] MRN: [id number] DOB:02/20/1996, 27 y.o., female Today's Date: 03/31/2023  END OF SESSION:  PT End of Session - 03/31/23 1150     Visit Number 1    Number of Visits 8    Date for PT Re-Evaluation 04/28/23    PT Start Time 1107   late   PT Stop Time 1150    PT Time Calculation (min) 43 min    Activity Tolerance Patient tolerated treatment well    Behavior During Therapy [facility] for tasks assessed/performed             Past Medical History:  Diagnosis Date   Abscess    Cancer (HCC) 10/16/2021   right breast cancer   Port-A-Cath in place 11/04/2021   Past Surgical History:  Procedure Laterality Date   BREAST RECONSTRUCTION WITH PLACEMENT OF TISSUE EXPANDER AND FLEX HD (ACELLULAR HYDRATED DERMIS) Bilateral 05/04/2022   Procedure: BREAST RECONSTRUCTION WITH PLACEMENT OF TISSUE EXPANDER AND FLEX HD (ACELLULAR HYDRATED DERMIS);  Surgeon: Janne Napoleon, MD;  Location: [facility] OR;  Service: Plastics;  Laterality: Bilateral;   LAPAROSCOPIC UNILATERAL SALPINGECTOMY Right 08/07/2022   Procedure: LAPAROSCOPIC RIGHT SALPINGECTOMY WITH REMOVAL OF ECTOPIC PREGNANCY;  Surgeon: Reva Bores, MD;  Location: [facility] OR;  Service: Gynecology;  Laterality: Right;   MASTECTOMY W/ SENTINEL NODE BIOPSY Bilateral 05/04/2022   Procedure: BILATERAL SKIN SPARING MASTECTOMIES, RIGHT SEED LOCALIZED LYMPH NODE BIOPSY, RIGHT SENTINEL NODE BIOPSY;  Surgeon: Almond Lint, MD;  Location: [facility] OR;  Service: General;  Laterality: Bilateral;   PORT-A-CATH REMOVAL Left 05/04/2022   Procedure: PORT REMOVAL;  Surgeon: Almond Lint, MD;  Location: [facility] OR;  Service: General;  Laterality: Left;   PORTACATH PLACEMENT Left 10/27/2021   Procedure: INSERTION PORT-A-CATH;  Surgeon: Almond Lint, MD;  Location: [facility] OR;  Service: General;  Laterality: Left;   REMOVAL OF TISSUE EXPANDER AND PLACEMENT OF IMPLANT Bilateral 02/04/2023   Procedure:  REMOVAL OF TISSUE EXPANDER AND PLACEMENT OF IMPLANT, capsulorraphy;  Surgeon: Santiago Glad, MD;  Location: [facility];  Service: Plastics;  Laterality: Bilateral;   Patient Active Problem List   Diagnosis Date Noted   Hemoperitoneum due to rupture of tubal ectopic pregnancy 08/07/2022   Breast cancer (HCC) 05/04/2022   Breast cancer metastasized to axillary lymph node, right (HCC) 05/04/2022   BRCA2 gene mutation positive 01/14/2022   Genetic testing 01/14/2022   Malignant neoplasm of upper-outer quadrant of right breast in female, estrogen receptor positive (HCC) 10/16/2021     REFERRING PROVIDER: Serena Croissant MD  REFERRING DIAG: Right Breast Cancer s/p Bilateral Mastectomies/decreased ROM  THERAPY DIAG:  Malignant neoplasm of upper-outer quadrant of right breast in female, estrogen receptor positive (HCC)  Abnormal posture  Aftercare following surgery for neoplasm  Stiffness of right shoulder, not elsewhere classified  ONSET DATE: 05/04/22  Rationale for Evaluation and Treatment: Rehabilitation  SUBJECTIVE:  SUBJECTIVE STATEMENT:  I am still not able to fully raise my arm, and I can feel something in my right armpit that stands out. I have been doing the post op exercises 1 x a day. No problems noted on the left. I think it might be a cord.  PERTINENT HISTORY:  Pt learned she had right breast Cancer on Oct 13, 2021 after having biopsies performed. Pt had found the lump herself. It was determined to be Right breast Invasive mammary carcinoma with positive LN, ER+ and Ki 67 of 60%. Underwent bilateral skin sparing mastectomies with R SLNB (0/8) and tissue expanders on 05/04/22 . She completed chemotherapy and radiation. Expanders removed and implant placed on 02/04/23.  PAIN:   Are you having pain? Yeswhen I raise right arm NPRS scale: 2/10 then she stops Pain location: Right arm pit Pain orientation: Right  PAIN TYPE: aching Pain description: intermittent  Aggravating factors: raising arm, Relieving factors: not using arm.  PRECAUTIONS: right UE lymphedema risk  WEIGHT BEARING RESTRICTIONS: No  FALLS:  Has patient fallen in last 6 months? No  LIVING ENVIRONMENT: Lives with: lives with their family, husband and 2 daughters ( 7 and 8) Lives in: House/apartment   OCCUPATION: not working  LEISURE: shop, travel, chill with TV  HAND DOMINANCE: left , but does a lot with her right hand  PRIOR LEVEL OF FUNCTION: Independent  PATIENT GOALS: Be able to use my right arm more normally   OBJECTIVE:  COGNITION:Within functional limits for tasks assessed Overall cognitive status:    PALPATION: Tender right axillary region to area of cording in axilla  OBSERVATIONS / OTHER ASSESSMENTS: very thin tight cords in axilla, 3 very visible, Breast incisions healed with glue still present  SENSATION: Light touch: Deficits    POSTURE: forward head ,rounded shoulders  UPPER EXTREMITY AROM/PROM:  A/PROM RIGHT   eval   Shoulder extension 65  Shoulder flexion 117  Shoulder abduction 88  Shoulder internal rotation   Shoulder external rotation     (Blank rows = not tested)  A/PROM LEFT   eval  Shoulder extension 65  Shoulder flexion 156  Shoulder abduction 179  Shoulder internal rotation 78  Shoulder external rotation 100    (Blank rows = not tested)  CERVICAL AROM: All within normal limits:     UPPER EXTREMITY STRENGTH:   LYMPHEDEMA ASSESSMENTS:   SURGERY TYPE/DATE: 05/04/22 bilateral skin sparing mastectomies with Right SLNB and immediate Tissue Expanders, 02/04/2023 Removal of tissue expanders and placement of implant.  NUMBER OF LYMPH NODES REMOVED: 0/8  CHEMOTHERAPY: completed  RADIATION:Completed  HORMONE TREATMENT:  YES  INFECTIONS: NO   LYMPHEDEMA ASSESSMENTS:   LANDMARK RIGHT  eval  At axilla  27  15 cm proximal to olecranon process   10 cm proximal to olecranon process 23.3  Olecranon process 21.0  15 cm proximal to ulnar styloid process   10 cm proximal to ulnar styloid process 17.15  Just proximal to ulnar styloid process 13.9  Across hand at thumb web space 17.4  At base of 2nd digit 5.4  (Blank rows = not tested)  LANDMARK LEFT  eval  At axilla  27.3  15 cm proximal to olecranon process   10 cm proximal to olecranon process 23.5  Olecranon process 21.7  15 cm proximal to ulnar styloid process   10 cm proximal to ulnar styloid process 17.7  Just proximal to ulnar styloid process 14  Across hand at thumb web space 17.3  At base  of 2nd digit 5.3  (Blank rows = not tested)     QUICK DASH SURVEY: 20%   TODAY'S TREATMENT:                                                                                                                                          DATE: 03/31/23 Educated in supine clasped hands flexion, supine star gazer and wall slides for abduction each x 3, chest stretch for NTS with hand on wall and wrist extension to release cords    PATIENT EDUCATION:  Education details: HEP as above, POC Person educated: Patient Education method: Programmer, multimedia, Facilities manager, and Handouts Education comprehension: verbalized understanding and returned demonstration  HOME EXERCISE PROGRAM: supine clasped hands flexion, star gazer and wall slides for abduction each x 3, chest stretch for NTS with hand on wall and wrist extension   ASSESSMENT:  CLINICAL IMPRESSION: Patient is a 27 y.o. female who was seen today for physical therapy evaluation and treatment for limited bilateral shoulder ROM following bilateral mastectomies and R SLNB (0/8) on 05/04/22 for treatment of R breast cancer. More recent surgery on 02/04/23 for removal of tissue expanders and placement of implants. Pt  presents with several very tight cords in the right axillary region, and significant limitations in the right shoulder ROM due to cording. There is no sign of UE swelling and her incisions are healing well. She will benefit from skilled PT to address deficits and return to PLOF.  OBJECTIVE IMPAIRMENTS: decreased activity tolerance, decreased knowledge of condition, decreased ROM, increased fascial restrictions, postural dysfunction, and pain.   ACTIVITY LIMITATIONS: dressing and reach over head  PARTICIPATION LIMITATIONS: cleaning and reaching activities  PERSONAL FACTORS: 3+ comorbidities: Right breast cancer with chemo and radiation  are also affecting patient's functional outcome.   REHAB POTENTIAL: Good  CLINICAL DECISION MAKING: Stable/uncomplicated  EVALUATION COMPLEXITY: Low  GOALS: Goals reviewed with patient? Yes  SHORT TERM GOALS=LONG TERM GOALS: Target date: 04/28/2023  Pt will be independent with HEP for Right shoulder ROM/strengthening Baseline: Goal status: INITIAL  2.  Quick dash will improve to no greater than 6% to demonstrate improved function Baseline: 20% Goal status: INITIAL  3.  Pt will have right shoulder ROM within 5-10 degrees of left for ability to reach and care for family Baseline:  Goal status: INITIAL  4.  Pt will have 0-min tightness/tenderness from cording Baseline:  Goal status: INITIAL  5.  Pt will be able to dress and bathe normally, and perform home activities without compensation from right shoulder limitations Baseline:  Goal status: INITIAL  PLAN:  PT FREQUENCY: 2x/week  PT DURATION: 4 weeks  PLANNED INTERVENTIONS: Therapeutic exercises, Therapeutic activity, Neuromuscular re-education, Patient/Family education, Self Care, Joint mobilization, Orthotic/Fit training, scar mobilization, Manual therapy, and Re-evaluation  PLAN FOR NEXT SESSION: perform SOZO, set up for ABC class, MFR techniques for cording, NTS, PROM, progress to  strength/function,  update HEP prn  Waynette Buttery, PT 03/31/2023, 2:27 PM

## 2023-03-31 ENCOUNTER — Ambulatory Visit: Payer: Medicaid Other | Attending: Hematology and Oncology

## 2023-03-31 ENCOUNTER — Other Ambulatory Visit: Payer: Self-pay

## 2023-03-31 DIAGNOSIS — C50411 Malignant neoplasm of upper-outer quadrant of right female breast: Secondary | ICD-10-CM | POA: Insufficient documentation

## 2023-03-31 DIAGNOSIS — Z170 Estrogen receptor positive status [ER+]: Secondary | ICD-10-CM | POA: Diagnosis present

## 2023-03-31 DIAGNOSIS — M25611 Stiffness of right shoulder, not elsewhere classified: Secondary | ICD-10-CM | POA: Insufficient documentation

## 2023-03-31 DIAGNOSIS — Z483 Aftercare following surgery for neoplasm: Secondary | ICD-10-CM | POA: Diagnosis present

## 2023-03-31 DIAGNOSIS — R293 Abnormal posture: Secondary | ICD-10-CM | POA: Insufficient documentation

## 2023-03-31 NOTE — Patient Instructions (Signed)

## 2023-04-06 ENCOUNTER — Ambulatory Visit: Payer: Medicaid Other

## 2023-04-12 ENCOUNTER — Ambulatory Visit: Payer: Medicaid Other

## 2023-04-12 VITALS — Wt 127.0 lb

## 2023-04-12 DIAGNOSIS — Z483 Aftercare following surgery for neoplasm: Secondary | ICD-10-CM

## 2023-04-12 DIAGNOSIS — C50411 Malignant neoplasm of upper-outer quadrant of right female breast: Secondary | ICD-10-CM

## 2023-04-12 DIAGNOSIS — M25611 Stiffness of right shoulder, not elsewhere classified: Secondary | ICD-10-CM

## 2023-04-12 DIAGNOSIS — R293 Abnormal posture: Secondary | ICD-10-CM

## 2023-04-12 NOTE — Therapy (Signed)
OUTPATIENT PHYSICAL THERAPY  UPPER EXTREMITY ONCOLOGY TREATMENT  Patient Name: Monica [name] MRN: [id number] DOB:[birth year], 27 y.o., female Today's Date: 04/12/2023  END OF SESSION:  PT End of Session - 04/12/23 1116     Visit Number 2    Number of Visits 8    Date for PT Re-Evaluation 04/28/23    PT Start Time 1115   pt arrived late   PT Stop Time 1202    PT Time Calculation (min) 47 min    Activity Tolerance Patient tolerated treatment well    Behavior During Therapy [facility] for tasks assessed/performed             Past Medical History:  Diagnosis Date   Abscess    Cancer (HCC) 10/16/2021   right breast cancer   Port-A-Cath in place 11/04/2021   Past Surgical History:  Procedure Laterality Date   BREAST RECONSTRUCTION WITH PLACEMENT OF TISSUE EXPANDER AND FLEX HD (ACELLULAR HYDRATED DERMIS) Bilateral 05/04/2022   Procedure: BREAST RECONSTRUCTION WITH PLACEMENT OF TISSUE EXPANDER AND FLEX HD (ACELLULAR HYDRATED DERMIS);  Surgeon: Janne Napoleon, MD;  Location: [facility] OR;  Service: Plastics;  Laterality: Bilateral;   LAPAROSCOPIC UNILATERAL SALPINGECTOMY Right 08/07/2022   Procedure: LAPAROSCOPIC RIGHT SALPINGECTOMY WITH REMOVAL OF ECTOPIC PREGNANCY;  Surgeon: Reva Bores, MD;  Location: [facility] OR;  Service: Gynecology;  Laterality: Right;   MASTECTOMY W/ SENTINEL NODE BIOPSY Bilateral 05/04/2022   Procedure: BILATERAL SKIN SPARING MASTECTOMIES, RIGHT SEED LOCALIZED LYMPH NODE BIOPSY, RIGHT SENTINEL NODE BIOPSY;  Surgeon: Almond Lint, MD;  Location: [facility] OR;  Service: General;  Laterality: Bilateral;   PORT-A-CATH REMOVAL Left 05/04/2022   Procedure: PORT REMOVAL;  Surgeon: Almond Lint, MD;  Location: [facility] OR;  Service: General;  Laterality: Left;   PORTACATH PLACEMENT Left 10/27/2021   Procedure: INSERTION PORT-A-CATH;  Surgeon: Almond Lint, MD;  Location: [facility] OR;  Service: General;  Laterality: Left;   REMOVAL OF TISSUE EXPANDER AND PLACEMENT OF IMPLANT Bilateral 02/04/2023    Procedure: REMOVAL OF TISSUE EXPANDER AND PLACEMENT OF IMPLANT, capsulorraphy;  Surgeon: Santiago Glad, MD;  Location: [facility];  Service: Plastics;  Laterality: Bilateral;   Patient Active Problem List   Diagnosis Date Noted   Hemoperitoneum due to rupture of tubal ectopic pregnancy 08/07/2022   Breast cancer (HCC) 05/04/2022   Breast cancer metastasized to axillary lymph node, right (HCC) 05/04/2022   BRCA2 gene mutation positive 01/14/2022   Genetic testing 01/14/2022   Malignant neoplasm of upper-outer quadrant of right breast in female, estrogen receptor positive (HCC) 10/16/2021     REFERRING PROVIDER: Serena Croissant MD  REFERRING DIAG: Right Breast Cancer s/p Bilateral Mastectomies/decreased ROM  THERAPY DIAG:  Malignant neoplasm of upper-outer quadrant of right breast in female, estrogen receptor positive (HCC)  Abnormal posture  Aftercare following surgery for neoplasm  Stiffness of right shoulder, not elsewhere classified  ONSET DATE: 05/04/22  Rationale for Evaluation and Treatment: Rehabilitation  SUBJECTIVE:  SUBJECTIVE STATEMENT:  The cord is feeling a little better today. I went out of town and have been moving my arm more so it's doing a bit better.   PERTINENT HISTORY:  Pt learned she had right breast Cancer on Oct 13, 2021 after having biopsies performed. Pt had found the lump herself. It was determined to be Right breast Invasive mammary carcinoma with positive LN, ER+ and Ki 67 of 60%. Underwent bilateral skin sparing mastectomies with R SLNB (0/8) and tissue expanders on 05/04/22 . She completed chemotherapy and radiation. Expanders removed and implant placed on 02/04/23.  PAIN:  Are you having pain? No  PRECAUTIONS: right UE lymphedema risk  WEIGHT  BEARING RESTRICTIONS: No  FALLS:  Has patient fallen in last 6 months? No  LIVING ENVIRONMENT: Lives with: lives with their family, husband and 2 daughters ( 7 and 8) Lives in: House/apartment   OCCUPATION: not working  LEISURE: shop, travel, chill with TV  HAND DOMINANCE: left , but does a lot with her right hand  PRIOR LEVEL OF FUNCTION: Independent  PATIENT GOALS: Be able to use my right arm more normally   OBJECTIVE:  COGNITION:Within functional limits for tasks assessed Overall cognitive status:    PALPATION: Tender right axillary region to area of cording in axilla  OBSERVATIONS / OTHER ASSESSMENTS: very thin tight cords in axilla, 3 very visible, Breast incisions healed with glue still present  SENSATION: Light touch: Deficits    POSTURE: forward head ,rounded shoulders  UPPER EXTREMITY AROM/PROM:  A/PROM RIGHT   eval   Shoulder extension 65  Shoulder flexion 117  Shoulder abduction 88  Shoulder internal rotation   Shoulder external rotation     (Blank rows = not tested)  A/PROM LEFT   eval  Shoulder extension 65  Shoulder flexion 156  Shoulder abduction 179  Shoulder internal rotation 78  Shoulder external rotation 100    (Blank rows = not tested)  CERVICAL AROM: All within normal limits:     UPPER EXTREMITY STRENGTH:   LYMPHEDEMA ASSESSMENTS:   SURGERY TYPE/DATE: 05/04/22 bilateral skin sparing mastectomies with Right SLNB and immediate Tissue Expanders, 02/04/2023 Removal of tissue expanders and placement of implant.  NUMBER OF LYMPH NODES REMOVED: 0/8  CHEMOTHERAPY: completed  RADIATION:Completed  HORMONE TREATMENT: YES  INFECTIONS: NO   LYMPHEDEMA ASSESSMENTS:   LANDMARK RIGHT  eval  At axilla  27  15 cm proximal to olecranon process   10 cm proximal to olecranon process 23.3  Olecranon process 21.0  15 cm proximal to ulnar styloid process   10 cm proximal to ulnar styloid process 17.15  Just proximal to ulnar styloid  process 13.9  Across hand at thumb web space 17.4  At base of 2nd digit 5.4  (Blank rows = not tested)  LANDMARK LEFT  eval  At axilla  27.3  15 cm proximal to olecranon process   10 cm proximal to olecranon process 23.5  Olecranon process 21.7  15 cm proximal to ulnar styloid process   10 cm proximal to ulnar styloid process 17.7  Just proximal to ulnar styloid process 14  Across hand at thumb web space 17.3  At base of 2nd digit 5.3  (Blank rows = not tested)     QUICK DASH SURVEY: 20%   TODAY'S TREATMENT:  DATE:  04/12/23: Therapeutic Exercises Pulleys into flexion and abd x 2 mins each returning therapist demo and VC's to decrease Rt scapular compensation throughout though this was minimal Roll yellow ball up wall into flexion x 7 and Rt abd x 5 returning therapist demo Supine over half foam roll for following: Bil UE horz abd x 5; bil UE scaption x 5 then bil UE abd "snow angel" Manual Therapy MFR to Rt axilla and upper arm where cording palpable but pt reports this feels improved since last week.  P/ROM of Rt shoulder into flexion, and and D2 to pts tolerance and with scapular depression throughout  03/31/23 Educated in supine clasped hands flexion, supine star gazer and wall slides for abduction each x 3, chest stretch for NTS with hand on wall and wrist extension to release cords    PATIENT EDUCATION:  Education details: HEP as above, POC Person educated: Patient Education method: Programmer, multimedia, Facilities manager, and Handouts Education comprehension: verbalized understanding and returned demonstration  HOME EXERCISE PROGRAM: supine clasped hands flexion, star gazer and wall slides for abduction each x 3, chest stretch for NTS with hand on wall and wrist extension   ASSESSMENT:  CLINICAL IMPRESSION: Pt reports her cording feels  some improved since she was here last for her eval. She has started trying to use her arm more and work on the [employer] at last session. Today progressed pt to include AA/A/ROM of Rt shoulder. Then also added manual therapy to Rt shoulder/axilla working to decrease Rt upper quadrant tightness. Pt drives from [place] and feels she is improving well with HEP. Was also instructed during session in how to incorporate more stretching into her ADLs (end ROM stretching in doorway or when reaching for higher shelves) so she would like to decr her freq to 1x/week. Pt cancelled this on her MyChart during session.   OBJECTIVE IMPAIRMENTS: decreased activity tolerance, decreased knowledge of condition, decreased ROM, increased fascial restrictions, postural dysfunction, and pain.   ACTIVITY LIMITATIONS: dressing and reach over head  PARTICIPATION LIMITATIONS: cleaning and reaching activities  PERSONAL FACTORS: 3+ comorbidities: Right breast cancer with chemo and radiation  are also affecting patient's functional outcome.   REHAB POTENTIAL: Good  CLINICAL DECISION MAKING: Stable/uncomplicated  EVALUATION COMPLEXITY: Low  GOALS: Goals reviewed with patient? Yes  SHORT TERM GOALS=LONG TERM GOALS: Target date: 04/28/2023  Pt will be independent with HEP for Right shoulder ROM/strengthening Baseline: Goal status: INITIAL  2.  Quick dash will improve to no greater than 6% to demonstrate improved function Baseline: 20% Goal status: INITIAL  3.  Pt will have right shoulder ROM within 5-10 degrees of left for ability to reach and care for family Baseline:  Goal status: INITIAL  4.  Pt will have 0-min tightness/tenderness from cording Baseline:  Goal status: INITIAL  5.  Pt will be able to dress and bathe normally, and perform home activities without compensation from right shoulder limitations Baseline:  Goal status: INITIAL  PLAN:  PT FREQUENCY: 2x/week  PT DURATION: 4 weeks  PLANNED  INTERVENTIONS: Therapeutic exercises, Therapeutic activity, Neuromuscular re-education, Patient/Family education, Self Care, Joint mobilization, Orthotic/Fit training, scar mobilization, Manual therapy, and Re-evaluation  PLAN FOR NEXT SESSION: Add supine scap next? set up for ABC class, MFR techniques for cording, NTS, PROM, progress to strength/function, update HEP prn  Hermenia Bers, PTA 04/12/2023, 12:31 PM

## 2023-04-14 ENCOUNTER — Ambulatory Visit: Payer: Medicaid Other

## 2023-04-18 ENCOUNTER — Ambulatory Visit: Payer: Medicaid Other | Attending: Hematology and Oncology

## 2023-04-18 DIAGNOSIS — R293 Abnormal posture: Secondary | ICD-10-CM | POA: Insufficient documentation

## 2023-04-18 DIAGNOSIS — Z170 Estrogen receptor positive status [ER+]: Secondary | ICD-10-CM | POA: Insufficient documentation

## 2023-04-18 DIAGNOSIS — C50411 Malignant neoplasm of upper-outer quadrant of right female breast: Secondary | ICD-10-CM | POA: Insufficient documentation

## 2023-04-18 DIAGNOSIS — Z483 Aftercare following surgery for neoplasm: Secondary | ICD-10-CM | POA: Insufficient documentation

## 2023-04-18 DIAGNOSIS — M25611 Stiffness of right shoulder, not elsewhere classified: Secondary | ICD-10-CM | POA: Insufficient documentation

## 2023-04-20 ENCOUNTER — Ambulatory Visit: Payer: Medicaid Other

## 2023-04-25 ENCOUNTER — Ambulatory Visit: Payer: Medicaid Other

## 2023-04-27 ENCOUNTER — Ambulatory Visit: Payer: Medicaid Other

## 2023-05-15 NOTE — Progress Notes (Signed)
Patient Care Team: Pcp, No as PCP - General Serena Croissant, MD as Consulting Physician (Hematology and Oncology) Dorothy Puffer, MD as Consulting Physician (Radiation Oncology) Almond Lint, MD as Consulting Physician (General Surgery)  DIAGNOSIS:  Encounter Diagnosis  Name Primary?   Malignant neoplasm of upper-outer quadrant of right breast in female, estrogen receptor positive (HCC) Yes    SUMMARY OF ONCOLOGIC HISTORY: Oncology History  Malignant neoplasm of upper-outer quadrant of right breast in female, estrogen receptor positive (HCC)  10/16/2021 Initial Diagnosis   Malignant neoplasm of upper-outer quadrant of right breast in female, estrogen receptor positive (HCC)   10/16/2021 Cancer Staging   Staging form: Breast, AJCC 8th Edition - Clinical stage from 10/16/2021: Stage IIA (cT3, cN1, cM0, G2, ER+, PR+, HER2: Equivocal) - Signed by Serena Croissant, MD on 10/16/2021 Histologic grading system: 3 grade system   10/28/2021 - 03/11/2022 Chemotherapy   Patient is on Treatment Plan : BREAST ADJUVANT DOSE DENSE AC q14d / PACLitaxel q7d      Genetic Testing   [facility] CancerNext-Expanded identified 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 Report date is 01/21/2022.  The CancerNext-Expanded gene panel offered by [facility] and includes sequencing, rearrangement, and RNA analysis for the following 77 genes: AIP, ALK, APC, ATM, AXIN2, BAP1, BARD1, BLM, BMPR1A, BRCA1, BRCA2, BRIP1, CDC73, CDH1, CDK4, CDKN1B, CDKN2A, CHEK2, CTNNA1, DICER1, FANCC, FH, FLCN, GALNT12, KIF1B, LZTR1, MAX, MEN1, MET, MLH1, MSH2, MSH3, MSH6, MUTYH, NBN, NF1, NF2, NTHL1, PALB2, PHOX2B, PMS2, POT1, PRKAR1A, PTCH1, PTEN, RAD51C, RAD51D, RB1, RECQL, RET, SDHA, SDHAF2, SDHB, SDHC, SDHD, SMAD4, SMARCA4, SMARCB1, SMARCE1, STK11, SUFU, TMEM127, TP53, TSC1, TSC2, VHL and XRCC2 (sequencing and deletion/duplication); EGFR, EGLN1, HOXB13, KIT, MITF, PDGFRA, POLD1, and POLE (sequencing only); EPCAM and GREM1  (deletion/duplication only).    05/04/2022 -  Anti-estrogen oral therapy   Tamoxifen   05/04/2022 Surgery   bilateral mastectomies 05/04/2022: Left breast: Benign Right mastectomy: 9 cm tumor with 5% residual tumor margins negative, 0/8 lymph nodes negative   06/14/2022 - 08/05/2022 Radiation Therapy   Site Technique Total Dose (Gy) Dose per Fx (Gy) Completed Fx Beam Energies  Chest Wall, Right: CW_R_SCV 3D 50.4/50.4 1.8 28/28 6X  Chest Wall, Right: CW_R_Bst specialPort 10/10 2 5/5 6E       CHIEF COMPLIANT: Follow-up tamoxifen  INTERVAL HISTORY: Monica [name] is a 27 y.o. female with right breast cancer. She presents to the clinic today for a follow-up. Patient reports that she is tolerating tamoxifen. She gets mild hot flashes and some leg cramps. Denies any pain or discomfort in breast.   ALLERGIES:  has No Known Allergies.  MEDICATIONS:  Current Outpatient Medications  Medication Sig Dispense Refill   ondansetron (ZOFRAN) 4 MG tablet Take 1 tablet (4 mg total) by mouth every 8 (eight) hours as needed for up to 20 doses for nausea or vomiting. 20 tablet 0   tamoxifen (NOLVADEX) 20 MG tablet Take 1 tablet (20 mg total) by mouth daily. 90 tablet 3   No current facility-administered medications for this visit.    PHYSICAL EXAMINATION: ECOG PERFORMANCE STATUS: 1 - Symptomatic but completely ambulatory  Vitals:   05/20/23 1156  BP: (!) 157/81  Pulse: 82  Resp: 18  Temp: (!) 97.2 F (36.2 C)  SpO2: 99%   Filed Weights   05/20/23 1156  Weight: 128 lb 9.6 oz (58.3 kg)    BREAST: No palpable masses or nodules in either right or left reconstructed breasts. No palpable axillary supraclavicular or infraclavicular adenopathy  (  exam performed in the presence of a chaperone)  LABORATORY DATA:  I have reviewed the data as listed    Latest Ref Rng & Units 08/07/2022    9:00 AM 03/18/2022    9:26 AM 03/11/2022    8:35 AM  CMP  Glucose 70 - 99 mg/dL 409  811  914   BUN 6 - 20  mg/dL 12  12  11    Creatinine 0.44 - 1.00 mg/dL 7.82  9.56  2.13   Sodium 135 - 145 mmol/L 140  139  140   Potassium 3.5 - 5.1 mmol/L 3.1  3.9  3.9   Chloride 98 - 111 mmol/L 103  106  106   CO2 22 - 32 mmol/L 23  27  26    Calcium 8.9 - 10.3 mg/dL 9.8  9.9  9.7   Total Protein 6.5 - 8.1 g/dL 6.8  6.8  6.8   Total Bilirubin 0.3 - 1.2 mg/dL 0.4  0.6  0.4   Alkaline Phos 38 - 126 U/L 43  31  32   AST 15 - 41 U/L 14  15  13    ALT 0 - 44 U/L 5  8  8      Lab Results  Component Value Date   WBC 3.7 (L) 08/07/2022   HGB 12.4 08/07/2022   HCT 35.3 (L) 08/07/2022   MCV 89.1 08/07/2022   PLT 211 08/07/2022   NEUTROABS 4.6 03/18/2022    ASSESSMENT & PLAN:  Malignant neoplasm of upper-outer quadrant of right breast in female, estrogen receptor positive (HCC) 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  Neoadjuvant chemotherapy with dose dense Adriamycin and Cytoxan followed by Taxol weekly x9 stopped 03/11/2022 2. bilateral mastectomies 05/04/2022: Left breast: Benign Right mastectomy: 9 cm tumor with 5% residual tumor margins negative, 0/8 lymph nodes negative   3.  Adjuvant radiation: 06/30/2022-08/05/2022 4.  Followed by adjuvant antiestrogen therapy (ovarian suppression) along with CDK 4 and 6 inhibitor 5.  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 She expressed to me that she has no plans of becoming pregnant  Breast cancer surveillance: Breast exam 05/20/2023: Benign No role of imaging since she had bilateral mastectomies.  Return to clinic in 1 year for follow-up    No orders of the defined types were placed in this encounter.  The patient has a good understanding of the overall plan. she agrees with it. she will call with any problems that may develop before the next visit here. Total time spent: 30 mins including face to face time and time spent for planning, charting and co-ordination of care   Tamsen Meek, MD 05/20/23    I Janan Ridge am acting as a Neurosurgeon for [employer]  I have reviewed the above documentation for accuracy and completeness, and I agree with the above.

## 2023-05-16 ENCOUNTER — Encounter: Payer: Medicaid Other | Admitting: Plastic Surgery

## 2023-05-18 ENCOUNTER — Ambulatory Visit (INDEPENDENT_AMBULATORY_CARE_PROVIDER_SITE_OTHER): Payer: Medicaid Other | Admitting: Surgical

## 2023-05-18 DIAGNOSIS — N6459 Other signs and symptoms in breast: Secondary | ICD-10-CM

## 2023-05-18 DIAGNOSIS — Z9889 Other specified postprocedural states: Secondary | ICD-10-CM

## 2023-05-18 NOTE — Progress Notes (Signed)
   Referring Provider No referring provider defined for this encounter.   CC:  Chief Complaint  Patient presents with   Post-op Follow-up      Monica [name] is an 27 y.o. female.  HPI: Patient is a 27 year old female here for follow-up after removal of bilateral breast tissue expanders and placement of bilateral breast implants with Dr. Ladona Ridgel on 02/04/2023.  She is just over 3 months postop.  She last saw Dr. Ladona Ridgel on 03/17/2023, discussed fat grafting to left breast for improving wrinkling and visibility of the left superior implant pole.  Patient presents today to discuss this.  Dr. Ladona Ridgel was also present during today's encounter  Review of Systems General: No fevers or chills  Physical Exam    04/12/2023   11:20 AM 03/17/2023    9:07 AM 02/10/2023   11:22 AM  Vitals with BMI  Weight 127 lbs    Systolic  122 98  Diastolic  89 62  Pulse  91 92    General:  No acute distress,  Alert and oriented, Non-Toxic, Normal speech and affect Breast: Wrinkling of left breast implant noted, worse when leaning forward.  Bilateral breast incisions are intact and well-healed.  Radiation skin changes noted on the right side.  Mild asymmetry of breast implants noted with the left breast being slightly more ptotic.  Assessment/Plan Patient was evaluated by myself and Dr. Ladona Ridgel, will plan for fat grafting to left breast superior pole for improvement of the wrinkling visibility of the implant.  Discussed risks associated with fat grafting from the abdomen to the left breast.  Discussed risks of infection, damage to the implant.  Discussed with patient we will plan to see her for follow-up at her preop appointment prior to surgery.  She is aware to call with questions or concerns.  Kermit Balo  05/18/2023, 10:29 AM

## 2023-05-20 ENCOUNTER — Other Ambulatory Visit: Payer: Self-pay

## 2023-05-20 ENCOUNTER — Inpatient Hospital Stay: Payer: Medicaid Other | Attending: Hematology and Oncology | Admitting: Hematology and Oncology

## 2023-05-20 VITALS — BP 157/81 | HR 82 | Temp 97.2°F | Resp 18 | Ht 66.0 in | Wt 128.6 lb

## 2023-05-20 DIAGNOSIS — Z9013 Acquired absence of bilateral breasts and nipples: Secondary | ICD-10-CM | POA: Insufficient documentation

## 2023-05-20 DIAGNOSIS — Z923 Personal history of irradiation: Secondary | ICD-10-CM | POA: Insufficient documentation

## 2023-05-20 DIAGNOSIS — C50411 Malignant neoplasm of upper-outer quadrant of right female breast: Secondary | ICD-10-CM | POA: Insufficient documentation

## 2023-05-20 DIAGNOSIS — Z79810 Long term (current) use of selective estrogen receptor modulators (SERMs): Secondary | ICD-10-CM | POA: Insufficient documentation

## 2023-05-20 DIAGNOSIS — Z170 Estrogen receptor positive status [ER+]: Secondary | ICD-10-CM | POA: Diagnosis not present

## 2023-05-20 DIAGNOSIS — Z9221 Personal history of antineoplastic chemotherapy: Secondary | ICD-10-CM | POA: Diagnosis not present

## 2023-05-20 NOTE — Assessment & Plan Note (Signed)
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  Neoadjuvant chemotherapy with dose dense Adriamycin and Cytoxan followed by Taxol weekly x9 stopped 03/11/2022 2. bilateral mastectomies 05/04/2022: Left breast: Benign Right mastectomy: 9 cm tumor with 5% residual tumor margins negative, 0/8 lymph nodes negative   3.  Adjuvant radiation: 06/30/2022-08/05/2022 4.  Followed by adjuvant antiestrogen therapy (ovarian suppression) along with CDK 4 and 6 inhibitor 5.  Genetic testing: BRCA2 mutation positive -------------------------------------------------------------------------------------------------------------   Current Treatment: Tamoxifen started 09/13/2022 Bone scan 11/06/21 Bone scan: No mets MRI guided biopsies 10/29/21: Left breast bx: Benign    Chemo-induced peripheral neuropathy: Mild Tamoxifen toxicities:  08/07/2022: Ectopic ruptured pregnancy.  Breast cancer surveillance: Breast exam 05/20/2023: Benign No role of imaging since she had bilateral mastectomies.  Return to clinic in 1 year for follow-up

## 2023-06-08 ENCOUNTER — Telehealth: Payer: Self-pay | Admitting: Plastic Surgery

## 2023-06-08 NOTE — Telephone Encounter (Signed)
Spoke to patient I let her know that [employer] [facility] denied her surgery and Dr.Taylor does want to try to appeal it. I let her know this usually takes a few weeks but we will let her know something as soon as we know something.

## 2023-06-21 ENCOUNTER — Telehealth: Payer: Self-pay

## 2023-06-21 NOTE — Telephone Encounter (Signed)
Attempted to call pt regarding signatera results lvm for pt to return call back.   

## 2023-08-16 ENCOUNTER — Encounter: Payer: Self-pay | Admitting: Hematology and Oncology

## 2023-08-23 LAB — SIGNATERA
SIGNATERA MTM READOUT: 0 MTM/ml
SIGNATERA TEST RESULT: NEGATIVE

## 2023-08-30 ENCOUNTER — Encounter: Payer: Self-pay | Admitting: Obstetrics

## 2023-08-30 ENCOUNTER — Ambulatory Visit (INDEPENDENT_AMBULATORY_CARE_PROVIDER_SITE_OTHER): Payer: Medicaid Other | Admitting: Obstetrics

## 2023-08-30 VITALS — BP 125/96 | HR 117 | Ht 66.0 in | Wt 129.0 lb

## 2023-08-30 DIAGNOSIS — Z5321 Procedure and treatment not carried out due to patient leaving prior to being seen by health care provider: Secondary | ICD-10-CM | POA: Diagnosis not present

## 2023-08-30 NOTE — Progress Notes (Unsigned)
    GYNECOLOGY PROGRESS NOTE  Subjective:  PCP: Pcp, No  Patient ID: Monica [name], female    DOB: January 13, 1996, 27 y.o.   MRN: [id number]  HPI  Patient is a 27 y.o. No obstetric history on file. female who presents for pelvic pain and pressure, missed period last month, Vaginal Itching.  {Common ambulatory SmartLinks:19316}  Review of Systems {ros; complete:30496}   Objective:   There were no vitals taken for this visit. There is no height or weight on file to calculate BMI.  General appearance: {general exam:16600} Abdomen: {abdominal exam:16834} Pelvic: {pelvic exam:16852::"cervix normal in appearance","external genitalia normal","no adnexal masses or tenderness","no cervical motion tenderness","rectovaginal septum normal","uterus normal size, shape, and consistency","vagina normal without discharge"} Extremities: {extremity exam:5109} Neurologic: {neuro exam:17854}   Assessment/Plan:   No diagnosis found.   There are no diagnoses linked to this encounter.     Julieanne Manson, DO [facility]

## 2023-09-05 ENCOUNTER — Ambulatory Visit: Payer: Medicaid Other | Admitting: Obstetrics

## 2023-09-05 ENCOUNTER — Other Ambulatory Visit (HOSPITAL_COMMUNITY)
Admission: RE | Admit: 2023-09-05 | Discharge: 2023-09-05 | Disposition: A | Discharge status: Home / Self Care | Payer: Medicaid Other | Source: Ambulatory Visit | Attending: Obstetrics | Admitting: Obstetrics

## 2023-09-05 ENCOUNTER — Encounter: Payer: Self-pay | Admitting: Obstetrics

## 2023-09-05 VITALS — BP 111/76 | HR 76 | Ht 66.0 in | Wt 128.0 lb

## 2023-09-05 DIAGNOSIS — N390 Urinary tract infection, site not specified: Secondary | ICD-10-CM

## 2023-09-05 DIAGNOSIS — N898 Other specified noninflammatory disorders of vagina: Secondary | ICD-10-CM

## 2023-09-05 DIAGNOSIS — Z1501 Genetic susceptibility to malignant neoplasm of breast: Secondary | ICD-10-CM

## 2023-09-05 DIAGNOSIS — Z124 Encounter for screening for malignant neoplasm of cervix: Secondary | ICD-10-CM | POA: Insufficient documentation

## 2023-09-05 NOTE — Progress Notes (Signed)
    GYNECOLOGY PROGRESS NOTE  Subjective:  PCP: Pcp, No  Patient ID: Monica [name], female    DOB: 03-25-1996, 27 y.o.   MRN: [id number]  HPI  Patient is a 27 y.o. [id number] female who presents for pelvic pain and missed period. Pt wants to make sure its not cancer. She carries the BRCA2 mutation and is currently on Tamoxifen therapy for R-sided metastatic breast cancer. She called her oncologist first and they suggested she see urgent care. They found urine consistent with UTI and started her on abx. Once on the medication, pt reports she had swelling in her pubic mons which has since resolved. She had similar swelling in the past after taking abx for an axillary abscess. After taking the abx (5 of 7 days) she developed severe vaginal itching and pain, felt raw. She used OTC Monistat x 3 days and now states the vaginal symptoms have resolved. Today she would like to make sure everything is ok. Is planning risk-reducing BSO at 27yo. Already has 2 children and is done with childbearing.   The following portions of the patient's history were reviewed and updated as appropriate: allergies, current medications, past family history, past medical history, past social history, past surgical history, and problem list.  Review of Systems Pertinent items are noted in HPI.   Objective:   Blood pressure 111/76, pulse 76, height 5\' 6"  (1.676 m), weight 128 lb (58.1 kg), last menstrual period 07/07/2023. Body mass index is 20.66 kg/m.  General appearance: alert and cooperative Abdomen: soft, non-tender; bowel sounds normal; no masses,  no organomegaly Pelvic: cervix normal in appearance, external genitalia normal, no adnexal masses or tenderness, no cervical motion tenderness, rectovaginal septum normal, uterus normal size, shape, and consistency, and vagina normal without discharge Extremities: extremities normal, atraumatic, no cyanosis or edema Neurologic: Grossly normal  Assessment/Plan:   1. UTI  (urinary tract infection), uncomplicated   2. Vaginal itching   3. BRCA2 gene mutation positive   4. Cervical cancer screening     Discussed patient's concerns that recent symptoms represent ovarian cancer, which we reviewed would be unlikely, as how rapidly her symptoms resolved. Symptoms most likely due to her UTI and then swelling as a reaction to the UTI antibiotics. This then led to a presumed yeast infection that resolved after OTC Monistat use. Today she is asymptomatic, but we will re-send her urine for culture to ensure resolution, as she didn't complete the antibiotic course in it's entirety due to the swelling. The swelling resolved once off the antibiotics. Aptima swab obtained to test for BV/Yeast and STI. Pap was also obtained, pt reported she was due. Will treat if results indicate and send results through MyChart. All questions answered today.   Total time was 30 minutes. That includes chart review before the visit, the actual patient visit, and time spent on documentation after the visit. Time excludes procedures, if any.    Julieanne Manson, DO [facility]

## 2023-09-07 ENCOUNTER — Other Ambulatory Visit: Payer: Self-pay | Admitting: Obstetrics

## 2023-09-07 DIAGNOSIS — B3731 Acute candidiasis of vulva and vagina: Secondary | ICD-10-CM

## 2023-09-07 LAB — CYTOLOGY - PAP
Chlamydia: NEGATIVE
Comment: NEGATIVE
Comment: NORMAL
Diagnosis: NEGATIVE
Neisseria Gonorrhea: NEGATIVE

## 2023-09-07 LAB — CERVICOVAGINAL ANCILLARY ONLY
Bacterial Vaginitis (gardnerella): NEGATIVE
Candida Glabrata: NEGATIVE
Candida Vaginitis: POSITIVE — AB
Comment: NEGATIVE
Comment: NEGATIVE
Comment: NEGATIVE
Comment: NEGATIVE
Trichomonas: NEGATIVE

## 2023-09-07 LAB — URINE CULTURE

## 2023-09-07 MED ORDER — FLUCONAZOLE 150 MG PO TABS
150.0000 mg | ORAL_TABLET | ORAL | 0 refills | Status: AC
Start: 2023-09-07 — End: ?

## 2023-09-28 ENCOUNTER — Other Ambulatory Visit: Payer: Self-pay | Admitting: Hematology and Oncology

## 2023-11-22 ENCOUNTER — Other Ambulatory Visit: Payer: Self-pay | Admitting: *Deleted

## 2023-11-22 DIAGNOSIS — C50411 Malignant neoplasm of upper-outer quadrant of right female breast: Secondary | ICD-10-CM

## 2023-11-22 NOTE — Progress Notes (Signed)
Renewal orders placed for Signatera testing.

## 2023-12-21 LAB — SIGNATERA ONLY (NATERA MANAGED)
SIGNATERA MTM READOUT: 0 MTM/ml
SIGNATERA TEST RESULT: NEGATIVE

## 2024-01-11 ENCOUNTER — Ambulatory Visit: Admitting: Plastic Surgery

## 2024-01-11 ENCOUNTER — Telehealth: Payer: Self-pay | Admitting: Plastic Surgery

## 2024-01-11 DIAGNOSIS — C50411 Malignant neoplasm of upper-outer quadrant of right female breast: Secondary | ICD-10-CM

## 2024-01-11 DIAGNOSIS — Z170 Estrogen receptor positive status [ER+]: Secondary | ICD-10-CM

## 2024-01-11 DIAGNOSIS — Z9889 Other specified postprocedural states: Secondary | ICD-10-CM

## 2024-01-11 NOTE — Progress Notes (Unsigned)
 Ms. Monica [name] returns today to discuss nipple tattooing.  Based on the thinness of the flaps and the fact that she had a prepack approach to her reconstruction I did not feel that she would be a good candidate for nipple tattooing.  I asked Mr. Monica [name] who does the nipple tattooing to evaluate her as well and he concurs that her skin is too thin for tattooing.  She is disappointed and would like to know what options she has.  I believe that her only real option would is to convert to autologous tissue reconstruction.  She is thin but she does have some excess tissue around the umbilicus.  She may be a candidate for a D IEP flap.  I have discussed this with her and discussed the procedure in very broad terms.  I have offered to refer her to Dr. Marita [name] for evaluation to see if she is an acceptable candidate for free tissue transfer and if she is interested in doing this.  The advantages that she will not have her implants or if she chooses to have implants under the flaps that she will have a thick enough tissue envelope to allow for nipple tattooing.  Is interested.  I will make the referral.  30 minutes spent reviewing the chart, examining the patient, discussing the plan, coordinating care, and documenting.

## 2024-01-11 NOTE — Telephone Encounter (Signed)
 Called [facility] to see if she could come early

## 2024-03-06 LAB — SIGNATERA
SIGNATERA MTM READOUT: 0 MTM/ml
SIGNATERA TEST RESULT: NEGATIVE

## 2024-05-21 ENCOUNTER — Inpatient Hospital Stay: Payer: Medicaid Other | Attending: Hematology and Oncology | Admitting: Hematology and Oncology

## 2024-05-21 NOTE — Assessment & Plan Note (Deleted)
 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  Neoadjuvant chemotherapy with dose dense Adriamycin  and Cytoxan  followed by Taxol  weekly x9 stopped 03/11/2022 2. bilateral mastectomies 05/04/2022: Left breast: Benign Right mastectomy: 9 cm tumor with 5% residual tumor margins negative, 0/8 lymph nodes negative   3.  Adjuvant radiation: 06/30/2022-08/05/2022 4.  Followed by adjuvant antiestrogen therapy (ovarian suppression) along with CDK 4 and 6 inhibitor 5.  Genetic testing: BRCA2 mutation positive -------------------------------------------------------------------------------------------------------------   Current Treatment: Tamoxifen  started 09/13/2022 Bone scan 11/06/21 Bone scan: No mets MRI guided biopsies 10/29/21: Left breast bx: Benign    Chemo-induced peripheral neuropathy: Mild Tamoxifen  toxicities: Tolerating it extremely well   08/07/2022: Ectopic ruptured pregnancy. She expressed to me that she has no plans of becoming pregnant   Breast cancer surveillance: Breast exam 05/21/2024: Benign No role of imaging since she had bilateral mastectomies.   Return to clinic in 1 year for follow-up

## 2024-07-12 LAB — SIGNATERA

## 2024-07-25 LAB — SIGNATERA
SIGNATERA MTM READOUT: 0 MTM/ml
SIGNATERA TEST RESULT: NEGATIVE

## 2024-10-24 ENCOUNTER — Encounter: Payer: Self-pay | Admitting: Hematology and Oncology

## 2024-11-07 ENCOUNTER — Other Ambulatory Visit: Payer: Self-pay | Admitting: Hematology and Oncology
